# Patient Record
Sex: Male | Born: 1965 | Race: White | Hispanic: No | Marital: Married | Smoking: Never smoker
Health system: Southern US, Community
[De-identification: ages and names within clinical notes are randomized; demographics above are authoritative.]

## PROBLEM LIST (undated history)

## (undated) DIAGNOSIS — J45909 Unspecified asthma, uncomplicated: Secondary | ICD-10-CM

## (undated) DIAGNOSIS — E119 Type 2 diabetes mellitus without complications: Secondary | ICD-10-CM

## (undated) DIAGNOSIS — I429 Cardiomyopathy, unspecified: Secondary | ICD-10-CM

## (undated) DIAGNOSIS — I509 Heart failure, unspecified: Secondary | ICD-10-CM

## (undated) DIAGNOSIS — I10 Essential (primary) hypertension: Secondary | ICD-10-CM

## (undated) HISTORY — PX: CARDIAC CATHETERIZATION: SHX172

---

## 2012-12-20 DIAGNOSIS — N12 Tubulo-interstitial nephritis, not specified as acute or chronic: Secondary | ICD-10-CM | POA: Insufficient documentation

## 2013-07-23 DIAGNOSIS — I429 Cardiomyopathy, unspecified: Secondary | ICD-10-CM

## 2013-07-23 HISTORY — DX: Cardiomyopathy, unspecified: I42.9

## 2014-06-07 ENCOUNTER — Inpatient Hospital Stay: Payer: Self-pay | Admitting: Internal Medicine

## 2014-06-07 LAB — COMPREHENSIVE METABOLIC PANEL
AST: 14 U/L — AB (ref 15–37)
Albumin: 3.2 g/dL — ABNORMAL LOW (ref 3.4–5.0)
Alkaline Phosphatase: 87 U/L
Anion Gap: 7 (ref 7–16)
BILIRUBIN TOTAL: 0.5 mg/dL (ref 0.2–1.0)
BUN: 15 mg/dL (ref 7–18)
CHLORIDE: 104 mmol/L (ref 98–107)
CO2: 24 mmol/L (ref 21–32)
Calcium, Total: 8.1 mg/dL — ABNORMAL LOW (ref 8.5–10.1)
Creatinine: 0.89 mg/dL (ref 0.60–1.30)
EGFR (African American): >60
EGFR (Non-African Amer.): >60
GLUCOSE: 293 mg/dL — AB (ref 65–99)
Osmolality: 282 (ref 275–301)
POTASSIUM: 3.7 mmol/L (ref 3.5–5.1)
SGPT (ALT): 26 U/L
SODIUM: 135 mmol/L — AB (ref 136–145)
TOTAL PROTEIN: 7 g/dL (ref 6.4–8.2)

## 2014-06-07 LAB — CBC
HCT: 44 % (ref 40.0–52.0)
HGB: 14.4 g/dL (ref 13.0–18.0)
MCH: 28.4 pg (ref 26.0–34.0)
MCHC: 32.8 g/dL (ref 32.0–36.0)
MCV: 87 fL (ref 80–100)
Platelet: 254 10*3/uL (ref 150–440)
RBC: 5.07 10*6/uL (ref 4.40–5.90)
RDW: 14.4 % (ref 11.5–14.5)
WBC: 11.7 10*3/uL — ABNORMAL HIGH (ref 3.8–10.6)

## 2014-06-07 LAB — CK-MB
CK-MB: 1.5 ng/mL (ref 0.5–3.6)
CK-MB: 0.8 ng/mL (ref 0.5–3.6)

## 2014-06-07 LAB — PROTIME-INR
INR: 1
PROTHROMBIN TIME: 13 s (ref 11.5–14.7)

## 2014-06-07 LAB — TROPONIN I
Troponin-I: <0.02 ng/mL
Troponin-I: <0.02 ng/mL

## 2014-06-07 LAB — CK TOTAL AND CKMB (NOT AT ARMC)
CK, Total: 75 U/L
CK-MB: 0.9 ng/mL (ref 0.5–3.6)

## 2014-06-07 LAB — PRO B NATRIURETIC PEPTIDE: B-Type Natriuretic Peptide: 712 pg/mL — ABNORMAL HIGH (ref 0–125)

## 2014-06-07 LAB — D-DIMER(ARMC): D-Dimer: 701 ng/ml

## 2014-06-08 LAB — CBC WITH DIFFERENTIAL/PLATELET
BASOS ABS: 0 10*3/uL (ref 0.0–0.1)
BASOS PCT: 0.1 %
Eosinophil #: 0 10*3/uL (ref 0.0–0.7)
Eosinophil %: 0 %
HCT: 42.5 % (ref 40.0–52.0)
HGB: 13.8 g/dL (ref 13.0–18.0)
LYMPHS ABS: 1.4 10*3/uL (ref 1.0–3.6)
Lymphocyte %: 12.8 %
MCH: 28.5 pg (ref 26.0–34.0)
MCHC: 32.6 g/dL (ref 32.0–36.0)
MCV: 88 fL (ref 80–100)
MONOS PCT: 4.3 %
Monocyte #: 0.5 x10 3/mm (ref 0.2–1.0)
Neutrophil #: 9.3 10*3/uL — ABNORMAL HIGH (ref 1.4–6.5)
Neutrophil %: 82.8 %
PLATELETS: 258 10*3/uL (ref 150–440)
RBC: 4.85 10*6/uL (ref 4.40–5.90)
RDW: 14 % (ref 11.5–14.5)
WBC: 11.2 10*3/uL — AB (ref 3.8–10.6)

## 2014-06-08 LAB — LIPID PANEL
CHOLESTEROL: 154 mg/dL (ref 0–200)
HDL Cholesterol: 36 mg/dL — ABNORMAL LOW (ref 40–60)
LDL CHOLESTEROL, CALC: 99 mg/dL (ref 0–100)
Triglycerides: 94 mg/dL (ref 0–200)
VLDL Cholesterol, Calc: 19 mg/dL (ref 5–40)

## 2014-06-08 LAB — BASIC METABOLIC PANEL
Anion Gap: 6 — ABNORMAL LOW (ref 7–16)
BUN: 14 mg/dL (ref 7–18)
CALCIUM: 8.1 mg/dL — AB (ref 8.5–10.1)
CHLORIDE: 106 mmol/L (ref 98–107)
CO2: 26 mmol/L (ref 21–32)
CREATININE: 0.79 mg/dL (ref 0.60–1.30)
EGFR (African American): >60
EGFR (Non-African Amer.): >60
GLUCOSE: 255 mg/dL — AB (ref 65–99)
Osmolality: 285 (ref 275–301)
Potassium: 4.1 mmol/L (ref 3.5–5.1)
Sodium: 138 mmol/L (ref 136–145)

## 2014-06-08 LAB — HEMOGLOBIN A1C: Hemoglobin A1C: 9.7 % — ABNORMAL HIGH (ref 4.2–6.3)

## 2014-06-09 LAB — BASIC METABOLIC PANEL
Anion Gap: 6 — ABNORMAL LOW (ref 7–16)
BUN: 22 mg/dL — ABNORMAL HIGH (ref 7–18)
CHLORIDE: 106 mmol/L (ref 98–107)
CO2: 27 mmol/L (ref 21–32)
Calcium, Total: 8.3 mg/dL — ABNORMAL LOW (ref 8.5–10.1)
Creatinine: 1.01 mg/dL (ref 0.60–1.30)
EGFR (African American): >60
EGFR (Non-African Amer.): >60
GLUCOSE: 212 mg/dL — AB (ref 65–99)
Osmolality: 287 (ref 275–301)
Potassium: 4 mmol/L (ref 3.5–5.1)
SODIUM: 139 mmol/L (ref 136–145)

## 2014-06-09 LAB — CBC
HCT: 41.5 % (ref 40.0–52.0)
HGB: 13.4 g/dL (ref 13.0–18.0)
MCH: 28.3 pg (ref 26.0–34.0)
MCHC: 32.4 g/dL (ref 32.0–36.0)
MCV: 87 fL (ref 80–100)
Platelet: 283 10*3/uL (ref 150–440)
RBC: 4.75 10*6/uL (ref 4.40–5.90)
RDW: 14.2 % (ref 11.5–14.5)
WBC: 15.7 10*3/uL — AB (ref 3.8–10.6)

## 2014-06-12 LAB — EXPECTORATED SPUTUM ASSESSMENT W GRAM STAIN, RFLX TO RESP C

## 2014-06-14 DIAGNOSIS — I10 Essential (primary) hypertension: Secondary | ICD-10-CM | POA: Insufficient documentation

## 2014-06-14 DIAGNOSIS — E785 Hyperlipidemia, unspecified: Secondary | ICD-10-CM | POA: Insufficient documentation

## 2014-06-14 DIAGNOSIS — I423 Endomyocardial (eosinophilic) disease: Secondary | ICD-10-CM | POA: Insufficient documentation

## 2014-06-14 DIAGNOSIS — I420 Dilated cardiomyopathy: Secondary | ICD-10-CM | POA: Insufficient documentation

## 2014-06-23 DIAGNOSIS — E119 Type 2 diabetes mellitus without complications: Secondary | ICD-10-CM | POA: Insufficient documentation

## 2014-08-11 DIAGNOSIS — J45909 Unspecified asthma, uncomplicated: Secondary | ICD-10-CM | POA: Insufficient documentation

## 2014-10-25 DIAGNOSIS — F419 Anxiety disorder, unspecified: Secondary | ICD-10-CM | POA: Insufficient documentation

## 2014-10-25 DIAGNOSIS — F329 Major depressive disorder, single episode, unspecified: Secondary | ICD-10-CM | POA: Insufficient documentation

## 2014-10-25 DIAGNOSIS — F32A Depression, unspecified: Secondary | ICD-10-CM | POA: Insufficient documentation

## 2014-11-09 ENCOUNTER — Other Ambulatory Visit (HOSPITAL_COMMUNITY): Payer: Self-pay | Admitting: Pediatrics

## 2014-11-09 DIAGNOSIS — I255 Ischemic cardiomyopathy: Secondary | ICD-10-CM

## 2014-11-13 NOTE — Consult Note (Signed)
Present Illness 49 year old male with no prior cardiac history who is admitted after presenting to the emergency room with a syncopal episode.  Patient has a history of reactive airway disease as well as diabetes mellitus.  He was sitting in a meeting where he felt a pounding in his had followed by a syncopal episode where the cause trauma to his left thigh.  Immediately upon the syncopal episode, he was placed on an EMS monitor.  This revealed sinus tachycardia.  There were no pauses or ventricular arrhythmias.  He was brought to emergency room where he was noted to have sinus rhythm.  He will about from myocardia infarction.  Echocardiogram revealed severely reduced left ventricular function with an ejection fraction of approximately 20-25%.  This was global hypokinesis.  Patient denies any prior history of he denies any history of chest pain.  He denies any fever chills.  He denies heavy ethanol intake.  Electrocardiogram reveals sinus rhythm.  His carotid Dopplers revealed no significant disease.  Chest x-ray revealed no pulmonary edema.  He denies orthopnea or PND.   Physical Exam:  GEN well developed, well nourished, no acute distress   HEENT PERRL, Ecchymosis over his left eye   NECK supple   RESP clear BS   CARD Regular rate and rhythm   ABD denies tenderness  no liver/spleen enlargement  normal BS   LYMPH negative neck, negative axillae   EXTR negative cyanosis/clubbing, negative edema   SKIN normal to palpation   NEURO cranial nerves intact, motor/sensory function intact   PSYCH A+O to time, place, person   Review of Systems:  Subjective/Chief Complaint general:  Caucasian male in no acute distress   General: No Complaints   Skin: No Complaints   ENT: No Complaints   Eyes: No Complaints   Neck: No Complaints   Respiratory: No Complaints   Cardiovascular: No Complaints   Gastrointestinal: No Complaints   Genitourinary: No Complaints   Vascular: No Complaints    Musculoskeletal: No Complaints   Neurologic: No Complaints   Hematologic: No Complaints   Endocrine: No Complaints   Psychiatric: No Complaints   Review of Systems: All other systems were reviewed and found to be negative   Medications/Allergies Reviewed Medications/Allergies reviewed   Family & Social History:  Family and Social History:  Family History Non-Contributory   Social History negative tobacco, positive ETOH   Place of Living Home   EKG:  EKG NSR   Interpretation sinus tachycardia    Penicillin: Other  Eggs: Unknown   Impression 49 year old male who presented with a syncopal episode.  He etiology of this is unclear.  Certainly ventricular or supraventricular arrhythmia may be playing a role.  Given his cardiomyopathy, ventricular arrhythmias as to be of some concern.  He etiology of his cardiomyopathy is unclear.  This is either ischemic verses etiopathic versus by role.  He has diabetes therefore small-vessel disease may be playing a role.  He has been placed on carvedilol as well as afterload reduction with an ACE-inhibitor.  He is not clinically in heart failure at present.  Will need to evaluate possible ischemic etiology of his cardiomyopathy with left cardiac catheterization.  Risk and benefits of this procedure were explained to the patient and he agrees to proceed.   Plan 1. Continue with carvedilol and ACE-inhibitor 2. Low-fat low-cholesterol diet 3. Daily weights 4. Proceed left cardiac catheterization to evaluate coronary anatomy 5. Consideration for  ICD versus empiric medical therapy based on the results of  the cardiac catheterization. 6. Further recommendations after catheterization is completed   Electronic Signatures: Dalia Heading (MD)  (Signed 17-Nov-15 19:55)  Authored: General Aspect/Present Illness, History and Physical Exam, Review of System, Family & Social History, EKG , Allergies, Impression/Plan   Last Updated: 17-Nov-15 19:55  by Dalia Heading (MD)

## 2014-11-13 NOTE — Consult Note (Signed)
PATIENT NAME:  Daniel [name], Daniel [name] MR#:  [id number] DATE OF BIRTH:  05-Jan-1966  DATE OF CONSULTATION:  06/08/2014  REFERRING PHYSICIAN:  Katharina Caper, MD  CONSULTING PHYSICIAN:  A. Wendall Mola, MD  CHIEF COMPLAINT: Uncontrolled diabetes.   HISTORY OF PRESENT ILLNESS: This is a 49 year old male with a history of diabetes, hypertension and asthma who was admitted yesterday after having had a syncopal event. He has undergone extensive laboratory testing and imaging and found to have an asthma exacerbation as well as new diagnosis of congestive heart failure. Diabetes is uncontrolled with a hemoglobin A1c of 9.7%. He has been started on Solu-Medrol for his asthma exacerbation. Today, blood sugars are consistently elevated in the 230-340 range. Currently receiving glipizide 5 mg twice daily for diabetes in addition to a NovoLog insulin sliding scale.   The patient has a history of diabetes since 2010. No prior hospitalizations for diabetes. Had been prescribed metformin prior to this hospitalization, which he had been compliant with. However, he states a few years ago he lost 100 pounds while on a very low carb diet and he is again hopeful he can improve his glycemic control with diet alone. However, he did subsequently regain that weight once he liberalized his diet. Also states that over the last year he has been put on steroids several times for asthma as well as poison ivy rash. Recalls his prior hemoglobin A1c about 7 months ago was 5.6%. He rarely checks blood sugars. Denies polyuria or increased thirst or blurred vision.   PAST MEDICAL HISTORY:  1.  Asthma.  2.  Hypertension.  3.  Morbid obesity.  4.  Congestive heart failure.  5.  Diabetes mellitus.   SOCIAL HISTORY: The patient is employed by [employer]. He is married with children. No tobacco use.   FAMILY HISTORY: Mother has diabetes. Father had heart disease with acute MI in his 36s.   ALLERGIES: INCLUDE  PENICILLIN AND EGG ALLERGIES.   CURRENT MEDICATIONS:  1.  Glipizide XL 5 mg twice daily.  2.  NovoLog insulin sliding scale.  3.  Coreg 3.125 mg b.i.d.  4.  Singulair 10 mg daily.  5.  Heparin 5000 units subcutaneous q. 8 hours.  6.  Albuterol nebulizers q. 3-4 hours.  7.  Advair 250/50 one puff b.i.d.  8.  Rocephin 1 gram daily.   REVIEW OF SYSTEMS: GENERAL: He has had fevers. He has had weakness.  HEENT: He has had blurred vision. Denies headache. NECK: Denies neck pain or dysphagia.  CARDIAC: Denies chest pain or palpitation.  PULMONARY: Reports wheeze and shortness of breath.  ABDOMEN: Appetite has been fair. Denies nausea.  EXTREMITIES: Denies lower extremity swelling. Denies focal arthralgia.  GENITOURINARY: Denies polyuria. Denies dysuria.  ENDOCRINE: Denies heat or cold intolerance.  HEMATOLOGIC: Denies easy bruisability or recent bleeding.  SKIN: Denies rash or recent skin changes.   PHYSICAL EXAMINATION:  VITAL SIGNS: Temperature 97.7, pulse 121, respirations 18, BP 117/74.  GENERAL: Morbidly obese white male in no acute distress.  HEENT: EOMI. Oropharynx is clear. Mucous membranes moist.  NECK: Supple. No thyromegaly. No thyroid bruit.  CARDIAC: Tachycardia without murmur. No carotid bruit.  PULMONARY: Mild wheeze, good air movement throughout.  ABDOMEN: Diffusely soft, nontender, nondistended.  SKIN: No rash or other skin changes.  EXTREMITIES: No peripheral edema is present.  NEUROLOGIC: No focal deficits.  PSYCHIATRIC: Calm, cooperative, alert and oriented.   LABORATORY: Glucose 255, BUN 14, creatinine 0.79, sodium 138, potassium 4.1, triglycerides 94,  total cholesterol 154, calcium 8.1. Hemoglobin A1c 9.7%, hematocrit 42.5. WBC 11.2, platelets 258,000.   ASSESSMENT: A 49 year old male with uncontrolled type 2 diabetes in setting of high-dose steroids.   RECOMMENDATIONS:  1.  Continue glipizide as you are.  2.  Hold metformin in the setting of congestive  heart failure.  3.  We will add a scheduled mealtime NovoLog of 6 units t.i.d. a.c., to be increased as needed to better control blood sugars.  4.  Continue NovoLog insulin sliding scale as you are.  5.  We will hold off on starting basal insulin for now.  6.  Encouraged the patient to start self-monitoring of blood sugars. He will need a glucometer prescription at discharge.   Thank you for the kind request for consultation. I will follow along with you.     ____________________________ A. Wendall Mola, MD ams:TT D: 06/08/2014 21:53:37 ET T: 06/08/2014 22:10:43 ET JOB#: [id number]  cc: A. Wendall Mola, MD, <Dictator> Macy Mis MD ELECTRONICALLY SIGNED 06/23/2014 17:35

## 2014-11-13 NOTE — Discharge Summary (Signed)
Dates of Admission and Diagnosis:  Date of Admission 07-Jun-2014   Date of Discharge 10-Jun-2014   Admitting Diagnosis syncopal episode   Final Diagnosis Severe cardiomyopathy- acute congestive heart failure No coronary artery disease. Likely Arrhythmia indused syncopal episode. Hypertension,  Diabetes mellitus Asthma    Chief Complaint/History of Present Illness 49 year old man with past medical history of diabetes, hypertension, asthma, presents after a syncopal event today. 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 He has been struggling with ear and sinus infections for the past 1 to 2 weeks. During the meeting he started to experience pulsing head pain "like someone was hitting my head with a mallet." He does not remember anything after that. The next thing he remembers is lying on the ground looking up at his colleagues trying to revive him. Per the people in the room, he stood up, took a few steps, and then fell forward onto his face and was unconscious. He denies any palpitations, chest pain, shortness of breath. He had no focal weakness or confusion upon waking. At the time of Emergency Room evaluation, he is asymptomatic, states that he is feeling fine, and would like to go home.  On examination, however, he has significant wheezing, and his chest x-ray and CT are suggestive of infection. He is admitted for further evaluation and treatment.   Allergies:  ceftriaxone: Hives  Penicillin: Other  Eggs: Unknown  Pertinent Past History:  Pertinent Past History 1.  Diabetes mellitus type 2.  2.  Hypertension.  3.  Asthma. 4.  Possible obstructive sleep apnea, not on CPAP.   Hospital Course:  Hospital Course 1. syncope in pt with severe cardiomyopathy, could be related to arrhythmias, getting cardiology consult, no obious ischaemic event, started coreg, ACEI.    As per Dr. Lady Gary- he will arrange  holter monitor as out pt- pt has to see him early next week. 2. cardiomyopathy, r/o CAD as per cath report, started coreg, lisinopril- As per cardio may need AICD- if not much improvement in next few weeks. 3. hyponatremia, likley dehydartion, supplemented IV, resolved  4. acute bronchitis vs pneumonia, now on zithromax, got sputum cx 5. asthma excerbation, steroids, duonebs, advair- minimal wheezing. 6 diabetes, type 2, hg a1c 9.7, poor control, advance diabetic meds,  appreciated endocrine consult discharge home today.   Condition on Discharge Stable   Code Status:  Code Status Full Code   DISCHARGE INSTRUCTIONS HOME MEDS:  Medication Reconciliation: Patient's Home Medications at Discharge:     Medication Instructions  singulair 10 mg oral tablet  1 tab(s) orally once a day (in the evening)   glipizide-metformin  10 milligram(s) orally once a day- before breakfast.   aspirin 81 mg oral delayed release tablet  1 tab(s) orally once a day x 30 days   lisinopril 5 mg oral tablet  1 tab(s) orally once a day   novolog flexpen 100 units/ml subcutaneous solution  4 unit(s) subcutaneous 2 times a day- before mealsunits for Sugar 70-150, 10 for 151- 200,for 201-250; 14 for 251-300; 16 units for >300.   carvedilol 3.125 mg oral tablet  1 tab(s) orally 2 times a day   advair diskus 250 mcg-50 mcg inhalation powder  1 puff(s) inhaled 2 times a day   azithromycin 3 day dose pack 500 mg oral tablet  1 tab(s) orally once a day x 3 days   glipizide-metformin  5 milligram(s) orally once  a day- before supper    STOP TAKING THE FOLLOWING MEDICATION(S):    metformin:  orally   Physician's Instructions:  Diet Low Sodium  Low Fat, Low Cholesterol  Carbohydrate Controlled (ADA) Diet   Activity Limitations As tolerated   Return to Work Not Applicable   Time frame for Follow Up Appointment 1-2 days  Dr. Lady Gary   Other Comments Follow with Dr. Lady Gary in 3-4 days.     Carlena Sax M(Consultant):  [facility] - Internal Medicine, [address], [place], [place] [address], [place] [phone]   Harold Hedge A(Consultant): [facility], [address] [place], [place] [address], [place] [phone]  Electronic Signatures: Altamese Dilling (MD)  (Signed [phone] 14:01)  Authored: ADMISSION DATE AND DIAGNOSIS, CHIEF COMPLAINT/HPI, Allergies, PERTINENT PAST HISTORY, HOSPITAL COURSE, DISCHARGE INSTRUCTIONS HOME MEDS, PATIENT INSTRUCTIONS, Follow Up Physician   Last Updated: 23-Nov-15 14:01 by Altamese Dilling (MD)

## 2014-11-13 NOTE — H&P (Signed)
PATIENT NAME:  Daniel [name], Daniel [name] MR#:  [id number] DATE OF BIRTH:  06-16-1966  DATE OF ADMISSION:  06/07/2014  REFERRING EMERGENCY ROOM PHYSICIAN: Dr Fanny Bien PRIMARY CARE PHYSICIAN: None.   CHIEF COMPLAINT: Syncope.   HISTORY OF PRESENT ILLNESS: This very pleasant 49 year old man with past medical history of diabetes, hypertension, asthma, presents after a syncopal event today. 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 He has been struggling with ear and sinus infections for the past 1 to 2 weeks. During the meeting he started to experience pulsing head pain "like someone was hitting my head with a mallet." He does not remember anything after that. The next thing he remembers is lying on the ground looking up at his colleagues trying to revive him. Per the people in the room, he stood up, took a few steps, and then fell forward onto his face and was unconscious. He denies any palpitations, chest pain, shortness of breath. He had no focal weakness or confusion upon waking. At the time of Emergency Room evaluation, he is asymptomatic, states that he is feeling fine, and would like to go home.  On examination, however, he has significant wheezing, and his chest x-ray and CT are suggestive of infection. He is admitted for further evaluation and treatment.   PAST MEDICAL HISTORY:  1.  Diabetes mellitus type 2.  2.  Hypertension.  3.  Asthma. 4.  Possible obstructive sleep apnea, not on CPAP.   SOCIAL HISTORY: The patient lives with his wife. 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 He does not smoke cigarettes, drink alcohol, or use illicit substances.   FAMILY MEDICAL HISTORY: His father had an MI in his 6s. His mother has diabetes and COPD. He has several family members with lung cancer, brain cancer, or breast cancer.   ALLERGIES: THE PATIENT IS ALLERGIC TO PENICILLIN AND EGGS.   HOME MEDICATIONS:  1.  Singulair 10 mg 1  tablet once a day.  2.  Metformin, dose unknown.  3.  Lisinopril 2.5 mg once a day at bedtime.  4.  Advair Diskus 250 mcg/50 mcg 1 puff twice a day.   REVIEW OF SYSTEMS:  GENERAL: Positive for fevers, chills, sweats, weakness. Negative for weight change.  HEENT: Positive for recent vision blurring. No pain in the eyes. Pounding in the ears, facial pressure, sore throat. No difficulty swallowing.  RESPIRATORY: Positive for cough, wheezing. Negative for hemoptysis. Sputum has been clear.  CARDIOVASCULAR: Negative for chest pain, palpitations. Positive as noted above for syncope. No edema.  GASTROINTESTINAL: Negative for nausea, vomiting, diarrhea, abdominal pain.  MUSCULOSKELETAL: Positive at this time for a hematoma over the left eye. No joint effusions, swollen tender joints, myalgias, or muscle weakness.  NEUROLOGIC: Positive for syncopal event today. Prior to that, no history of syncope, seizure, CVA, focal numbness or weakness or confusion.  PSYCHIATRIC: He does endorse significant stress. He has not slept in greater than 24 hours but states that this is routine for his job.   PHYSICAL EXAMINATION:  VITAL SIGNS: Temperature 97.5, pulse 118, respirations 20, blood pressure 122/74, oxygenation 94% on room air.  GENERAL: No acute distress.  HEENT: Pupils are equal, round, and reactive to light. Extraocular motion is intact. Conjunctivae are clear. Oral mucous membranes are pink and moist. Good dentition. Posterior oropharynx is clear without exudate or edema. There is slight erythema in the posterior oropharynx.  NECK: Trachea is midline.  Shotty cervical lymphadenopathy. Thyroid is nontender.  RESPIRATORY: Diffuse loud wheezes and rhonchi, fair air movement, breathing rapidly. Lots of coughing during examination. Expiratory wheezing.  CARDIOVASCULAR: Tachycardic, regular. No murmurs, rubs, or gallops. No peripheral edema. Peripheral pulses are 2+.  ABDOMEN: Obese, soft, nontender. Bowel sounds  are normal. No guarding or rebound.  MUSCULOSKELETAL: No joint effusions. Strength is 5/5 throughout.  SKIN: He has a large hematoma over the left eye. No open wound, no rashes.  NEUROLOGIC: Cranial nerves II through XII grossly intact. Strength and sensation are intact.  PSYCHIATRIC: The patient is alert and oriented, has good insight into his clinical condition. No signs of uncontrolled depression or anxiety.   LABORATORY DATA: Sodium 135, potassium 3.7, chloride 104, bicarbonate 24, BUN 15, creatinine 0.89, blood sugar 293. LFTs normal. Cardiac enzymes negative x 1. White blood cells 11.7, hemoglobin 14.4, platelets 254,000, MCV 87.  IMAGING: 1.  CT angiography for PE due to high D-dimer is negative for signs of PE. Does show patchy areas of ground glass disease in both lungs. Differential diagnosis includes edema versus atypical infection. May have areas of air trapping. There are a few small nodular densities in the lungs, largest measuring 4 mm. Also had mid mediastinal and hilar lymphadenopathy. 2.  CT, maxillofacial area without contrast, shows left scalp hematoma without underlying fracture, normal noncontrast CT of the brain, no facial fracture, acute paranasal sinusitis. 3.  Chest x-ray shows no active cardiopulmonary disease.   ASSESSMENT AND PLAN:  1.  Syncopal episode: Will admit to the floor on telemetry. Will cycle cardiac enzymes. Will order a 2D echocardiogram, also, in light of the CT findings suggesting possible pulmonary edema. Will go ahead and treat his sinusitis and provide intravenous hydration. 2.  Acute asthma exacerbation: Treat with nebulizers, steroids, and  azithromycin. Continue to monitor respiratory status closely.  3.  CT findings suggestive of possible pulmonary edema versus atypical infection: Treating atypical infection with azithromycin. We will get a 2-D echocardiogram to evaluate possibility of pulmonary edema. The pulmonary nodules have been discussed with  him and he knows that he is to have a repeat CT in 6 months to follow up. He does note that he has a long history of untreated asthma, which may be the cause of the air trapping, which may be the cause of some of these findings; could have some scarring due to lack of asthma treatment in the past.  4.  Diabetes mellitus: Check a hemoglobin A1c and use sliding scale insulin while inpatient. 5.  Sinusitis: Will be treated with azithromycin.  6.  Hypertension: Continue to monitor. Will need to double check his dose of lisinopril; this is more likely 20 mg rather than the 2 mg noted in the medication administration record.  7.  Deep vein thrombosis prophylaxis with heparin, no gastrointestinal prophylaxis.   TIME SPENT ON ADMISSION: 45 minutes.    ____________________________ Ena Dawley. Clent Ridges, MD cpw:ST D: 06/07/2014 22:20:16 ET T: 06/07/2014 23:18:53 ET JOB#: [id number]  cc: Santina Evans P. Clent Ridges, MD, <Dictator> Gale Journey MD ELECTRONICALLY SIGNED 06/10/2014 13:21

## 2014-11-16 DIAGNOSIS — E559 Vitamin D deficiency, unspecified: Secondary | ICD-10-CM | POA: Insufficient documentation

## 2014-11-18 ENCOUNTER — Ambulatory Visit (HOSPITAL_COMMUNITY)
Admission: RE | Admit: 2014-11-18 | Discharge: 2014-11-18 | Disposition: A | Discharge status: Home / Self Care | Payer: Disability Insurance | Source: Ambulatory Visit | Attending: Cardiology | Admitting: Cardiology

## 2014-11-18 DIAGNOSIS — G4730 Sleep apnea, unspecified: Secondary | ICD-10-CM | POA: Insufficient documentation

## 2014-11-18 DIAGNOSIS — E119 Type 2 diabetes mellitus without complications: Secondary | ICD-10-CM | POA: Insufficient documentation

## 2014-11-18 DIAGNOSIS — I10 Essential (primary) hypertension: Secondary | ICD-10-CM | POA: Insufficient documentation

## 2014-11-18 DIAGNOSIS — I255 Ischemic cardiomyopathy: Secondary | ICD-10-CM | POA: Diagnosis present

## 2014-11-18 MED ORDER — PERFLUTREN LIPID MICROSPHERE
1.0000 mL | INTRAVENOUS | Status: AC | PRN
  Administered 2014-11-18: 3 mL via INTRAVENOUS

## 2014-11-18 NOTE — Progress Notes (Signed)
2D Echo Performed 11/18/2014    Clearence Ped, RCS  Definity used BP before 130/89 and 108/70 after

## 2015-02-18 ENCOUNTER — Other Ambulatory Visit: Payer: Self-pay | Admitting: Cardiology

## 2015-02-18 DIAGNOSIS — I428 Other cardiomyopathies: Secondary | ICD-10-CM

## 2015-02-23 ENCOUNTER — Ambulatory Visit: Admission: RE | Admit: 2015-02-23 | Payer: PRIVATE HEALTH INSURANCE | Source: Ambulatory Visit | Admitting: Cardiology

## 2015-02-24 ENCOUNTER — Ambulatory Visit
Admission: RE | Admit: 2015-02-24 | Discharge: 2015-02-24 | Disposition: A | Discharge status: Home / Self Care | Payer: PRIVATE HEALTH INSURANCE | Source: Ambulatory Visit | Attending: Cardiology | Admitting: Cardiology

## 2015-02-24 DIAGNOSIS — I429 Cardiomyopathy, unspecified: Secondary | ICD-10-CM | POA: Diagnosis present

## 2015-02-24 DIAGNOSIS — I428 Other cardiomyopathies: Secondary | ICD-10-CM

## 2015-02-24 HISTORY — DX: Type 2 diabetes mellitus without complications: E11.9

## 2015-02-24 MED ORDER — TECHNETIUM TC 99M-LABELED RED BLOOD CELLS IV KIT
22.4700 | PACK | Freq: Once | INTRAVENOUS | Status: AC | PRN
  Administered 2015-02-24: 22.47 via INTRAVENOUS

## 2015-05-27 DIAGNOSIS — Z87898 Personal history of other specified conditions: Secondary | ICD-10-CM | POA: Insufficient documentation

## 2015-06-21 ENCOUNTER — Encounter
Admission: RE | Admit: 2015-06-21 | Discharge: 2015-06-21 | Disposition: A | Discharge status: Home / Self Care | Payer: PRIVATE HEALTH INSURANCE | Source: Ambulatory Visit | Attending: Cardiology | Admitting: Cardiology

## 2015-06-21 DIAGNOSIS — Z01812 Encounter for preprocedural laboratory examination: Secondary | ICD-10-CM | POA: Insufficient documentation

## 2015-06-21 HISTORY — DX: Essential (primary) hypertension: I10

## 2015-06-21 HISTORY — DX: Unspecified asthma, uncomplicated: J45.909

## 2015-06-21 HISTORY — DX: Cardiomyopathy, unspecified: I42.9

## 2015-06-21 HISTORY — DX: Heart failure, unspecified: I50.9

## 2015-06-21 LAB — URINALYSIS COMPLETE WITH MICROSCOPIC (ARMC ONLY)
BILIRUBIN URINE: NEGATIVE
Bacteria, UA: NONE SEEN
GLUCOSE, UA: 150 mg/dL — AB
HGB URINE DIPSTICK: NEGATIVE
Ketones, ur: NEGATIVE mg/dL
LEUKOCYTES UA: NEGATIVE
NITRITE: NEGATIVE
Protein, ur: NEGATIVE mg/dL
SPECIFIC GRAVITY, URINE: 1.01 (ref 1.005–1.030)
Squamous Epithelial / LPF: NONE SEEN
pH: 5 (ref 5.0–8.0)

## 2015-06-21 LAB — PROTIME-INR
INR: 0.85
Prothrombin Time: 11.9 seconds (ref 11.4–15.0)

## 2015-06-21 LAB — BASIC METABOLIC PANEL
Anion gap: 9 (ref 5–15)
BUN: 16 mg/dL (ref 6–20)
CALCIUM: 9.5 mg/dL (ref 8.9–10.3)
CO2: 27 mmol/L (ref 22–32)
Chloride: 102 mmol/L (ref 101–111)
Creatinine, Ser: 0.81 mg/dL (ref 0.61–1.24)
GFR calc Af Amer: >60 mL/min (ref >60)
GLUCOSE: 227 mg/dL — AB (ref 65–99)
POTASSIUM: 4.1 mmol/L (ref 3.5–5.1)
Sodium: 138 mmol/L (ref 135–145)

## 2015-06-21 LAB — DIFFERENTIAL
BASOS ABS: 0.1 10*3/uL (ref 0–0.1)
BASOS PCT: 1 %
EOS ABS: 0.7 10*3/uL (ref 0–0.7)
Eosinophils Relative: 5 %
Lymphocytes Relative: 16 %
Lymphs Abs: 2.3 10*3/uL (ref 1.0–3.6)
MONOS PCT: 6 %
Monocytes Absolute: 0.9 10*3/uL (ref 0.2–1.0)
NEUTROS ABS: 10.4 10*3/uL — AB (ref 1.4–6.5)
NEUTROS PCT: 72 %

## 2015-06-21 LAB — CBC
HEMATOCRIT: 44.1 % (ref 40.0–52.0)
Hemoglobin: 14.4 g/dL (ref 13.0–18.0)
MCH: 27.8 pg (ref 26.0–34.0)
MCHC: 32.5 g/dL (ref 32.0–36.0)
MCV: 85.5 fL (ref 80.0–100.0)
PLATELETS: 296 10*3/uL (ref 150–440)
RBC: 5.16 MIL/uL (ref 4.40–5.90)
RDW: 15 % — AB (ref 11.5–14.5)
WBC: 14.3 10*3/uL — AB (ref 3.8–10.6)

## 2015-06-21 LAB — SURGICAL PCR SCREEN
MRSA, PCR: NEGATIVE
Staphylococcus aureus: NEGATIVE

## 2015-06-21 LAB — APTT: APTT: 26 s (ref 24–36)

## 2015-06-21 NOTE — Pre-Procedure Instructions (Signed)
Cathy from Dr. Maisie Fus' office called back to clarify pre-op orders: Clindamycin 900 mg IVPB, Vancomycin 1.5 grams IVPB and Gentamycin 80 mg in 500 ml irrigation fluid.

## 2015-06-21 NOTE — Pre-Procedure Instructions (Signed)
Pt agreed to call [facility] cardiology to find out which meds to take the day of surgery.

## 2015-06-21 NOTE — Pre-Procedure Instructions (Signed)
Unable to read dosage of Vancomycin pre-op order.   Called Dr. Maisie Fus' office, spoke with Roxanne one of the nurses, she will get this order verified and call this RN back.  Also requested last EKG that was done in their office be faxed to this office.

## 2015-06-21 NOTE — Patient Instructions (Addendum)
  Your procedure is scheduled on: Friday Dec. 16, 2016. Report to Same Day Surgery. To find out your arrival time please call [phone] between 1PM - 3PM on Thursday Dec. 15, 2016.  Remember: Instructions that are not followed completely may result in serious medical risk, up to and including death, or upon the discretion of your surgeon and anesthesiologist your surgery may need to be rescheduled.    _x___ 1. Do not eat food or drink liquids after midnight. No gum chewing or hard candies.     _x___ 2. No Alcohol for 24 hours before or after surgery.   ____ 3. Bring all medications with you on the day of surgery if instructed.    __x__ 4. Notify your doctor if there is any change in your medical condition     (cold, fever, infections).     Do not wear jewelry, make-up, hairpins, clips or nail polish.  Do not wear lotions, powders, or perfumes. You may wear deodorant.  Do not shave 48 hours prior to surgery. Men may shave face and neck.  Do not bring valuables to the hospital.    [facility] is not responsible for any belongings or valuables.               Contacts, dentures or bridgework may not be worn into surgery.  Leave your suitcase in the car. After surgery it may be brought to your room.  For patients admitted to the hospital, discharge time is determined by your treatment team.   Patients discharged the day of surgery will not be allowed to drive home.    Please read over the following fact sheets that you were given:   [facility] Preparing for Surgery  __x__ Take these medicines the morning of surgery with A SIP OF WATER:    1. As instructed by cardiologist.   ____ Luther Parody Enema (as directed)   __x__ Use CHG Soap as directed  __x__ Use inhalers on the day of surgery and bring to hospital.  __x__ Stop metformin 2 days prior to surgery    __x__ Take 1/2 of usual insulin dose the night before surgery and none on the morning of surgery.   __x__ Check with Dr.  Maisie Fus to see if pt needs to stop daily Aspirin.  _x_ Stop Anti-inflammatories on now.  Can take Tylenol for pain.   ____ Stop supplements until after surgery.    ____ Bring C-Pap to the hospital.

## 2015-07-08 ENCOUNTER — Encounter: Payer: Self-pay | Admitting: *Deleted

## 2015-07-08 ENCOUNTER — Observation Stay
Admission: RE | Admit: 2015-07-08 | Discharge: 2015-07-09 | Disposition: A | Discharge status: Home / Self Care | Payer: PRIVATE HEALTH INSURANCE | Source: Ambulatory Visit | Attending: Internal Medicine | Admitting: Internal Medicine

## 2015-07-08 ENCOUNTER — Ambulatory Visit: Payer: PRIVATE HEALTH INSURANCE | Admitting: Anesthesiology

## 2015-07-08 ENCOUNTER — Encounter
Admission: RE | Disposition: A | Discharge status: Home / Self Care | Payer: Self-pay | Source: Ambulatory Visit | Attending: Internal Medicine

## 2015-07-08 ENCOUNTER — Ambulatory Visit: Payer: PRIVATE HEALTH INSURANCE

## 2015-07-08 DIAGNOSIS — E7800 Pure hypercholesterolemia, unspecified: Secondary | ICD-10-CM | POA: Insufficient documentation

## 2015-07-08 DIAGNOSIS — Z880 Allergy status to penicillin: Secondary | ICD-10-CM | POA: Diagnosis not present

## 2015-07-08 DIAGNOSIS — I5022 Chronic systolic (congestive) heart failure: Secondary | ICD-10-CM | POA: Diagnosis not present

## 2015-07-08 DIAGNOSIS — R0602 Shortness of breath: Secondary | ICD-10-CM | POA: Diagnosis not present

## 2015-07-08 DIAGNOSIS — Z950 Presence of cardiac pacemaker: Secondary | ICD-10-CM

## 2015-07-08 DIAGNOSIS — Z91012 Allergy to eggs: Secondary | ICD-10-CM | POA: Diagnosis not present

## 2015-07-08 DIAGNOSIS — F419 Anxiety disorder, unspecified: Secondary | ICD-10-CM | POA: Diagnosis not present

## 2015-07-08 DIAGNOSIS — E119 Type 2 diabetes mellitus without complications: Secondary | ICD-10-CM | POA: Diagnosis not present

## 2015-07-08 DIAGNOSIS — Z79899 Other long term (current) drug therapy: Secondary | ICD-10-CM | POA: Insufficient documentation

## 2015-07-08 DIAGNOSIS — I423 Endomyocardial (eosinophilic) disease: Secondary | ICD-10-CM | POA: Diagnosis not present

## 2015-07-08 DIAGNOSIS — R55 Syncope and collapse: Secondary | ICD-10-CM | POA: Insufficient documentation

## 2015-07-08 DIAGNOSIS — F329 Major depressive disorder, single episode, unspecified: Secondary | ICD-10-CM | POA: Insufficient documentation

## 2015-07-08 DIAGNOSIS — R079 Chest pain, unspecified: Secondary | ICD-10-CM | POA: Insufficient documentation

## 2015-07-08 DIAGNOSIS — E785 Hyperlipidemia, unspecified: Secondary | ICD-10-CM | POA: Insufficient documentation

## 2015-07-08 DIAGNOSIS — Z888 Allergy status to other drugs, medicaments and biological substances status: Secondary | ICD-10-CM | POA: Diagnosis not present

## 2015-07-08 DIAGNOSIS — E559 Vitamin D deficiency, unspecified: Secondary | ICD-10-CM | POA: Insufficient documentation

## 2015-07-08 DIAGNOSIS — Z794 Long term (current) use of insulin: Secondary | ICD-10-CM | POA: Diagnosis not present

## 2015-07-08 DIAGNOSIS — I110 Hypertensive heart disease with heart failure: Secondary | ICD-10-CM | POA: Diagnosis present

## 2015-07-08 DIAGNOSIS — Z881 Allergy status to other antibiotic agents status: Secondary | ICD-10-CM | POA: Diagnosis not present

## 2015-07-08 DIAGNOSIS — I420 Dilated cardiomyopathy: Secondary | ICD-10-CM | POA: Insufficient documentation

## 2015-07-08 DIAGNOSIS — Z7982 Long term (current) use of aspirin: Secondary | ICD-10-CM | POA: Insufficient documentation

## 2015-07-08 HISTORY — PX: IMPLANTABLE CARDIOVERTER DEFIBRILLATOR (ICD) GENERATOR CHANGE: SHX5469

## 2015-07-08 LAB — GLUCOSE, CAPILLARY
GLUCOSE-CAPILLARY: 187 mg/dL — AB (ref 65–99)
Glucose-Capillary: 199 mg/dL — ABNORMAL HIGH (ref 65–99)
Glucose-Capillary: 146 mg/dL — ABNORMAL HIGH (ref 65–99)
Glucose-Capillary: 284 mg/dL — ABNORMAL HIGH (ref 65–99)

## 2015-07-08 SURGERY — ICD GENERATOR CHANGE
Anesthesia: Monitor Anesthesia Care | Site: Shoulder | Laterality: Left | Wound class: Clean

## 2015-07-08 MED ORDER — MIDAZOLAM HCL 2 MG/2ML IJ SOLN
INTRAMUSCULAR | Status: DC | PRN
  Administered 2015-07-08: 2 mg via INTRAVENOUS
  Administered 2015-07-08 (×2): 1 mg via INTRAVENOUS

## 2015-07-08 MED ORDER — SODIUM CHLORIDE 0.9 % IR SOLN
Freq: Once | Status: AC
  Administered 2015-07-08: 09:00:00

## 2015-07-08 MED ORDER — SPIRONOLACTONE 25 MG PO TABS
25.0000 mg | ORAL_TABLET | ORAL | Status: DC
  Administered 2015-07-09: 25 mg via ORAL
  Filled 2015-07-08: qty 1

## 2015-07-08 MED ORDER — ONDANSETRON HCL 4 MG/2ML IJ SOLN: 4.0000 mg | Freq: Four times a day (QID) | INTRAMUSCULAR | Status: DC | PRN

## 2015-07-08 MED ORDER — VANCOMYCIN HCL 10 G IV SOLR
1500.0000 mg | Freq: Once | INTRAVENOUS | Status: AC
  Administered 2015-07-08: 1500 mg via INTRAVENOUS
  Filled 2015-07-08: qty 1500

## 2015-07-08 MED ORDER — MORPHINE SULFATE (PF) 2 MG/ML IV SOLN
2.0000 mg | INTRAVENOUS | Status: DC | PRN
  Administered 2015-07-08 – 2015-07-09 (×3): 2 mg via INTRAVENOUS
  Filled 2015-07-08 (×3): qty 1

## 2015-07-08 MED ORDER — LIDOCAINE HCL (PF) 1 % IJ SOLN
INTRAMUSCULAR | Status: AC
  Filled 2015-07-08: qty 30

## 2015-07-08 MED ORDER — FENTANYL CITRATE (PF) 100 MCG/2ML IJ SOLN
25.0000 ug | INTRAMUSCULAR | Status: DC | PRN
  Administered 2015-07-08 (×2): 25 ug via INTRAVENOUS

## 2015-07-08 MED ORDER — LIDOCAINE 1 % OPTIME INJ - NO CHARGE
INTRAMUSCULAR | Status: DC | PRN
  Administered 2015-07-08: 30 mL

## 2015-07-08 MED ORDER — GLIPIZIDE ER 10 MG PO TB24
10.0000 mg | ORAL_TABLET | Freq: Two times a day (BID) | ORAL | Status: DC
  Administered 2015-07-08 – 2015-07-09 (×2): 10 mg via ORAL
  Filled 2015-07-08 (×2): qty 1

## 2015-07-08 MED ORDER — ONDANSETRON HCL 4 MG/2ML IJ SOLN: 4.0000 mg | Freq: Once | INTRAMUSCULAR | Status: DC | PRN

## 2015-07-08 MED ORDER — INSULIN ASPART 100 UNIT/ML ~~LOC~~ SOLN
0.0000 [IU] | Freq: Three times a day (TID) | SUBCUTANEOUS | Status: DC
  Administered 2015-07-08: 11 [IU] via SUBCUTANEOUS
  Filled 2015-07-08: qty 11

## 2015-07-08 MED ORDER — ASPIRIN EC 81 MG PO TBEC
81.0000 mg | DELAYED_RELEASE_TABLET | ORAL | Status: DC
  Administered 2015-07-09: 81 mg via ORAL
  Filled 2015-07-08: qty 1

## 2015-07-08 MED ORDER — DEXMEDETOMIDINE HCL IN NACL 200 MCG/50ML IV SOLN
INTRAVENOUS | Status: DC | PRN
  Administered 2015-07-08 (×3): 8 ug via INTRAVENOUS
  Administered 2015-07-08: 12 ug via INTRAVENOUS
  Administered 2015-07-08: 8 ug via INTRAVENOUS
  Administered 2015-07-08: 4 ug via INTRAVENOUS

## 2015-07-08 MED ORDER — MONTELUKAST SODIUM 10 MG PO TABS
10.0000 mg | ORAL_TABLET | ORAL | Status: DC
  Administered 2015-07-09: 10 mg via ORAL
  Filled 2015-07-08: qty 1

## 2015-07-08 MED ORDER — SODIUM CHLORIDE 0.9 % IV SOLN
INTRAVENOUS | Status: DC
  Administered 2015-07-08: 11:00:00 via INTRAVENOUS

## 2015-07-08 MED ORDER — INSULIN ASPART 100 UNIT/ML ~~LOC~~ SOLN: 0.0000 [IU] | Freq: Every day | SUBCUTANEOUS | Status: DC

## 2015-07-08 MED ORDER — METFORMIN HCL ER 500 MG PO TB24
1000.0000 mg | ORAL_TABLET | Freq: Two times a day (BID) | ORAL | Status: DC
  Administered 2015-07-09: 1000 mg via ORAL
  Filled 2015-07-08: qty 2

## 2015-07-08 MED ORDER — FENTANYL CITRATE (PF) 100 MCG/2ML IJ SOLN
INTRAMUSCULAR | Status: AC
  Administered 2015-07-08: 25 ug via INTRAVENOUS
  Filled 2015-07-08: qty 2

## 2015-07-08 MED ORDER — ONDANSETRON HCL 4 MG/2ML IJ SOLN
INTRAMUSCULAR | Status: DC | PRN
  Administered 2015-07-08: 4 mg via INTRAVENOUS

## 2015-07-08 MED ORDER — CARVEDILOL 12.5 MG PO TABS
6.2500 mg | ORAL_TABLET | Freq: Two times a day (BID) | ORAL | Status: DC
  Administered 2015-07-09: 6.25 mg via ORAL
  Filled 2015-07-08: qty 1

## 2015-07-08 MED ORDER — FAMOTIDINE 20 MG PO TABS
20.0000 mg | ORAL_TABLET | Freq: Once | ORAL | Status: AC
  Administered 2015-07-08: 20 mg via ORAL

## 2015-07-08 MED ORDER — FUROSEMIDE 20 MG PO TABS
20.0000 mg | ORAL_TABLET | Freq: Every day | ORAL | Status: DC
  Administered 2015-07-09: 20 mg via ORAL
  Filled 2015-07-08: qty 1

## 2015-07-08 MED ORDER — SODIUM CHLORIDE 0.9 % IR SOLN
Status: DC | PRN
  Administered 2015-07-08: 150 mL

## 2015-07-08 MED ORDER — INSULIN ASPART 100 UNIT/ML ~~LOC~~ SOLN: 0.0000 [IU] | Freq: Three times a day (TID) | SUBCUTANEOUS | Status: DC

## 2015-07-08 MED ORDER — LISINOPRIL 10 MG PO TABS
10.0000 mg | ORAL_TABLET | Freq: Every day | ORAL | Status: DC
  Administered 2015-07-09: 10 mg via ORAL
  Filled 2015-07-08: qty 1

## 2015-07-08 MED ORDER — SERTRALINE HCL 50 MG PO TABS
50.0000 mg | ORAL_TABLET | Freq: Every day | ORAL | Status: DC
  Administered 2015-07-08: 50 mg via ORAL
  Filled 2015-07-08 (×2): qty 1

## 2015-07-08 MED ORDER — INSULIN ASPART 100 UNIT/ML ~~LOC~~ SOLN: 6.0000 [IU] | Freq: Two times a day (BID) | SUBCUTANEOUS | Status: DC

## 2015-07-08 MED ORDER — CLINDAMYCIN PHOSPHATE 900 MG/50ML IV SOLN
900.0000 mg | Freq: Once | INTRAVENOUS | Status: AC
  Administered 2015-07-08: 900 mg via INTRAVENOUS

## 2015-07-08 MED ORDER — FAMOTIDINE 20 MG PO TABS
ORAL_TABLET | ORAL | Status: AC
  Administered 2015-07-08: 10:00:00
  Filled 2015-07-08: qty 1

## 2015-07-08 MED ORDER — ACETAMINOPHEN 325 MG PO TABS
325.0000 mg | ORAL_TABLET | ORAL | Status: DC | PRN
  Filled 2015-07-08: qty 2

## 2015-07-08 MED ORDER — OXYCODONE-ACETAMINOPHEN 5-325 MG PO TABS
1.0000 | ORAL_TABLET | Freq: Four times a day (QID) | ORAL | Status: DC | PRN
  Administered 2015-07-08 – 2015-07-09 (×3): 2 via ORAL
  Filled 2015-07-08 (×3): qty 2

## 2015-07-08 MED ORDER — SODIUM CHLORIDE 0.9 % IJ SOLN
INTRAMUSCULAR | Status: AC
  Filled 2015-07-08: qty 3

## 2015-07-08 MED ORDER — CLINDAMYCIN PHOSPHATE 900 MG/50ML IV SOLN
INTRAVENOUS | Status: AC
  Administered 2015-07-08: 10:00:00
  Filled 2015-07-08: qty 50

## 2015-07-08 MED ORDER — GLIPIZIDE ER 10 MG PO TB24: 10.0000 mg | ORAL_TABLET | Freq: Two times a day (BID) | ORAL | Status: DC

## 2015-07-08 MED ORDER — FENTANYL CITRATE (PF) 100 MCG/2ML IJ SOLN
INTRAMUSCULAR | Status: DC | PRN
  Administered 2015-07-08 (×2): 50 ug via INTRAVENOUS

## 2015-07-08 MED ORDER — MAGNESIUM OXIDE 400 (241.3 MG) MG PO TABS
400.0000 mg | ORAL_TABLET | ORAL | Status: DC
  Administered 2015-07-09: 400 mg via ORAL
  Filled 2015-07-08: qty 1

## 2015-07-08 SURGICAL SUPPLY — 43 items
BAG DECANTER FOR FLEXI CONT (MISCELLANEOUS) ×3 IMPLANT
CABLE SURG 12 DISP A/V CHANNEL (MISCELLANEOUS) ×3 IMPLANT
CANISTER SUCT 1200ML W/VALVE (MISCELLANEOUS) ×3 IMPLANT
CHLORAPREP W/TINT 26ML (MISCELLANEOUS) ×3 IMPLANT
COVER LIGHT HANDLE STERIS (MISCELLANEOUS) ×6 IMPLANT
COVER MAYO STAND STRL (DRAPES) ×3 IMPLANT
COVER PROBE FLX POLY STRL (MISCELLANEOUS) ×3 IMPLANT
DRAPE C-ARM XRAY 36X54 (DRAPES) ×3 IMPLANT
DRESSING TELFA 4X3 1S ST N-ADH (GAUZE/BANDAGES/DRESSINGS) ×3 IMPLANT
DRSG TEGADERM 4X4.75 (GAUZE/BANDAGES/DRESSINGS) ×3 IMPLANT
GLOVE BIO SURGEON STRL SZ7.5 (GLOVE) ×6 IMPLANT
GOWN STRL REUS W/ TWL LRG LVL3 (GOWN DISPOSABLE) ×1 IMPLANT
GOWN STRL REUS W/ TWL XL LVL3 (GOWN DISPOSABLE) ×1 IMPLANT
GOWN STRL REUS W/TWL LRG LVL3 (GOWN DISPOSABLE) ×2
GOWN STRL REUS W/TWL XL LVL3 (GOWN DISPOSABLE) ×2
HANDLE YANKAUER SUCT BULB TIP (MISCELLANEOUS) ×3 IMPLANT
ICD VISIA MRI VR DVFB1D4 (ICD Generator) ×1 IMPLANT
IMMOBILIZER SHDR LG LX 900803 (SOFTGOODS) ×3 IMPLANT
IMMOBILIZER SHDR XL LX WHT (SOFTGOODS) ×3 IMPLANT
INTRO PACEMAKR LEAD 9FR 13CM (INTRODUCER) ×3
INTRODUCER PACEMKR LD 9FR 13CM (INTRODUCER) ×1 IMPLANT
IV NS 1000ML (IV SOLUTION) ×2
IV NS 1000ML BAXH (IV SOLUTION) ×1 IMPLANT
KIT RM TURNOVER STRD PROC AR (KITS) ×3 IMPLANT
LABEL OR SOLS (LABEL) ×3 IMPLANT
LEAD SPRINT QUAT SEC 6935M-62 (Lead) ×3 IMPLANT
NEEDLE FILTER BLUNT 18X 1/2SAF (NEEDLE) ×2
NEEDLE FILTER BLUNT 18X1 1/2 (NEEDLE) ×1 IMPLANT
NEEDLE HYPO 25X1 1.5 SAFETY (NEEDLE) ×3 IMPLANT
NEEDLE SPNL 22GX3.5 QUINCKE BK (NEEDLE) ×3 IMPLANT
NS IRRIG 500ML POUR BTL (IV SOLUTION) ×3 IMPLANT
PACK PACE INSERTION (MISCELLANEOUS) ×3 IMPLANT
PAD GROUND ADULT SPLIT (MISCELLANEOUS) ×3 IMPLANT
PAD STATPAD (MISCELLANEOUS) ×3 IMPLANT
SUT DVC V-LOC 4-0 90 CLR P-12 (SUTURE) ×3
SUT DVC VLOC 3-0 CL 6 P-12 (SUTURE) ×3 IMPLANT
SUT SILK 0 CT 1 30 (SUTURE) ×3 IMPLANT
SUT VIC AB 3-0 PS2 18 (SUTURE) ×3 IMPLANT
SUTURE DVC V-LC4-0 90 CLR P-12 (SUTURE) ×1 IMPLANT
SYR CONTROL 10ML (SYRINGE) ×3 IMPLANT
SYRINGE 10CC LL (SYRINGE) ×3 IMPLANT
TOWEL OR 17X26 4PK STRL BLUE (TOWEL DISPOSABLE) ×3 IMPLANT
VISIA MRI VR DVFB1D4 (ICD Generator) ×3 IMPLANT

## 2015-07-08 NOTE — Progress Notes (Signed)
Patient states he does a sliding scale insulin at home. Notified Dr. Joana Reamer, orders for sliding scale received.

## 2015-07-08 NOTE — Anesthesia Postprocedure Evaluation (Signed)
Anesthesia Post Note  Patient: Daniel [name]  Procedure(s) Performed: Procedure(s) (LRB): ICD GENERATOR CHANGE/INITIAL IMPLANT (Left)  Patient location during evaluation: PACU Anesthesia Type: General Level of consciousness: awake Pain management: pain level controlled Vital Signs Assessment: post-procedure vital signs reviewed and stable Respiratory status: spontaneous breathing Cardiovascular status: blood pressure returned to baseline Postop Assessment: no headache Anesthetic complications: no    Last Vitals:  Filed Vitals:   07/08/15 1302 07/08/15 1317  BP: 131/77 115/78  Pulse: 84 84  Temp:    Resp: 19 15    Last Pain: There were no vitals filed for this visit.               Terrisa Curfman M

## 2015-07-08 NOTE — Progress Notes (Signed)
Left shoulder immobilizer intact from OR. 

## 2015-07-08 NOTE — H&P (Signed)
Referring Provider/Primary Cardiologist: Daniel [name] [facility] [place], [place]   Primary Care Provider: Jonetta Osgood, NP No address on file   Patient Profile:   Daniel [name] is a very pleasant 49 y.o. male who I was asked to see for consideration of a primary prevention ICD. 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 EF 20%   Pyelonephritis ICD-10-CM: N12 ICD-9-CM: 590.80 12/20/2012 - Present     Current Medications:  Current Outpatient Prescriptions  Medication Sig Dispense Refill  . albuterol sulfate 90 mcg/actuation AePB Inhale 180 mcg into the lungs every 4 (four) hours as needed (Inhale 2 puffs every 4-6 hours prn).  Marland Kitchen aspirin 81 MG EC tablet Take 81 mg by mouth once daily.  . carvedilol (COREG) 6.25 MG tablet Take 1  tablet (6.25 mg total) by mouth 2 (two) times daily with meals. 60 tablet 6  . ergocalciferol, vitamin D2, 50,000 unit capsule Take 50,000 Units by mouth once a week.  . FUROsemide (LASIX) 40 MG tablet Take 1 tablet (40 mg total) by mouth once daily. 30 tablet 11  . glipiZIDE (GLUCOTROL XL) 10 MG XL tablet Take 1 tablet twice daily with meals 60 tablet 5  . insulin LISPRO (HUMALOG KWIKPEN) 100 unit/mL pen injector Inject 6-16 units at lunch and dinner as directed 15 mL 3  . lisinopril (PRINIVIL,ZESTRIL) 5 MG tablet Take 1 tablet (5 mg total) by mouth once daily. 30 tablet 6  . magnesium aspartate (MAGINEX) 61 mg (615 mg) tablet Take 615 mg by mouth 2 (two) times daily.  . metFORMIN (GLUCOPHAGE-XR) 500 MG XR tablet Take 1 tablet (500 mg) at supper for 3-4 days and then increase to 2 tablets (1,000 mg) at supper 60 tablet 3  . montelukast (SINGULAIR) 10 mg tablet TAKE ONE TABLET BY MOUTH ONCE DAILY 30 tablet 0  . ONETOUCH ULTRA TEST test strip Use 3 (three) times daily. Use as instructed. 200 each 3  . pen needle, diabetic 31 gauge x 5/16" needle Use one needle three times a day as directed 100 each 12  . sertraline (ZOLOFT) 25 MG tablet Take 1 tablet (25 mg total) by mouth once daily. 30 tablet 11  . spironolactone (ALDACTONE) 25 MG tablet Take 1 tablet (25 mg total) by mouth once daily. 30 tablet 11   No current facility-administered medications for this visit.    Allergies:  Ceftriaxone; Egg; Penicillins; and Simvastatin  History of Present Illness   [name] is a very pleasant 49  y.o. male with HFReF NICM. The patient has had shortness of breath, has had chest pain, has had near syncope, has had syncope, and has had palpitations. The patient has had orthopnea or PND.  He endorses NYHA class II sxs. Initial diagnosis was 05/2014 had syncope and sxs of HF. He has been treated with optimal medical therapy since that time.   MUGA study showed EF 32%. 02/2015 Had episode syncope 04/22/2015,  prior episode 05/2014 No prodrome no significant injuries.   NYHA class remains II-III He is on disabilty  His wife has cancer and is undergoing treatment  + FH of SCD and cardiomyopathy  Family History  Notable for sister with cardiomyopathy EF 35%, father died suddenly.  Social History   Social History   Social History  . Marital status: Married  Spouse name: N/A  . Number of children: N/A  . Years of education: N/A   Occupational History  . Not on file.   Social History Main Topics  . Smoking status: Never Smoker  . Smokeless tobacco: Current User  Comment: 1/4 can snuff daily  . Alcohol use No  . Drug use: No  . Sexual activity: Not on file   Other Topics Concern  . Not on file   Social History Narrative   Review of Systems   Constitutional: negative Eyes: negative Ears, nose, mouth, throat, and face: negative Respiratory: positive for dyspnea on exertion, pleurisy/chest pain and wheezing Cardiovascular: positive for chest pain, chest pressure/discomfort, dyspnea, fatigue, irregular heart beat, near-syncope, orthopnea, palpitations, paroxysmal nocturnal dyspnea, syncope, tachypnea and abdominal distention Gastrointestinal: negative Genitourinary:negative Integument/breast: negative Hematologic/lymphatic: negative Musculoskeletal:negative Neurological: negative Behavioral/Psych: positive for depression and fatigue Endocrine: negative Allergic/Immunologic: negative  Physical Examinations   Vital Signs:  Visit Vitals  . BP 128/82 (BP Location: Left upper arm, Patient Position: Sitting, BP Cuff Size: Large Adult)  . Pulse 93  . Ht 170.2 cm (5\' 7" )  . Wt (!) 140.8 kg (310 lb 8 oz)  . SpO2 98%  . BMI 48.63 kg/m2  Body mass index is 48.63 kg/(m^2). General appearance: He appears well, in no apparent distress. Alert and oriented times three.   Neck: supple, no significant adenopathy, carotids upstroke normal bilaterally, no bruits   Thyroid: thyroid  is normal in size without nodules or tenderness   Lungs: clear to auscultation, no wheezes, rales or rhonchi, symmetric air entry   CV: normal rate and regular rhythm  Abdomen: soft, nontender, nondistended, no masses or organomegaly   Neurological: alert, oriented, normal speech, no focal findings or movement disorder noted   Musculoskeletal: full range of motion without pain, moves all extremities. Ambulates without difficulty.  Extremities:, no pedal edema, no clubbing or cyanosis   ECG  Rhythm: normal sinus rhythm, rate=97 bpm, QTc=417 ms. QRS=114 No ST segment or T-wave abnormalities.  Echocardiogram  Date: 11/18/2014 - Left ventricle: The cavity size was normal. There was moderate  concentric hypertrophy with thinning and akinesis of the anterior  wall. Systolic function was moderately reduced. The estimated  ejection fraction was in the range of 35% to 40%. Doppler  parameters are consistent with abnormal left ventricular  relaxation (grade 1 diastolic dysfunction). The E/e&' ratio is  between 8-15, suggesting indeterminate LV filling pressure.  - Left atrium: The atrium was normal in size.  - Right atrium: The atrium was mildly dilated.  - Tricuspid valve: There was trivial regurgitation.  - Pulmonary arteries: PA peak pressure: 7 mm Hg (S).  - Inferior vena cava: The  vessel was normal in size. The  respirophasic diameter changes were in the normal range (= 50%),  consistent with normal central venous pressure.    Impressions:    - Technically difficult study. Definity contrast was administered.  There is clear anterior scar with akinesis - EF 35-40%, diastolic  dysfunction with indeterminate LV filling pressure.     Labs   Hematology No results found for: WBC, HGB, HCT, MCV, PLT  Chemistries Lab Results  Component Value Date  CREATININE 0.9 04/27/2015  BUN 15 04/27/2015  NA 136 04/27/2015  K 4.2 04/27/2015  CL 97  04/27/2015  CO2 30.9 04/27/2015   Liver Function Studies No results found for: ALT, AST, GGT, ALKPHOS, BILITOT  Thyroid Function Studies No results found for: TSH, T3TOTAL, T4TOTAL, THYROIDAB  INR No results found for: INR  Assessment and Plan    His EF <35%, has had had syncope and +FH; he is at an increased risk of sudden cardiac death and would likely benefit from an ICD. I spent some time with him reviewing the potential benefits and risks of the procedure. Specifically, I informed him that the potential risks include but are not limited to infection, bleeding, pneumothorax, and cardiac perforation with or without tamponade. He verbalized understanding, and he signed the consent form in my presence in clinic today. His procedure is not scheduled at this time. I gave him a scrub brush to use the morning of his procedure. We plan to implant a single coil single-chamber ICD in the left infraclavicular region, and I plan not to do DFT testing. I plan to give him vancomycin and clindamycin before his procedure. He knows to call us should he have any questions or problems at any time. Vendor MDT will be done in [place].     PREPROCEDURAL DIAGNOSIS: Chronic systolic Heart failure  EF: LVEF 30%  NYHA Class: III  QRSD: 116 ms  ADMISSION STATUS Phase III telemetry for overnight observation  PROCEDURE STATUS elective  REFERRING MD Self No address on file  DEVICE VENDOR Medtronic  PLANNED SIDE left  PREPROCEDURAL ANTIOBIOTICS Ancef 2 grams

## 2015-07-08 NOTE — Transfer of Care (Signed)
Immediate Anesthesia Transfer of Care Note  Patient: Daniel [name]  Procedure(s) Performed: Procedure(s): ICD GENERATOR CHANGE/INITIAL IMPLANT (Left)  Patient Location: PACU  Anesthesia Type:MAC  Level of Consciousness: awake, alert , oriented and patient cooperative  Airway & Oxygen Therapy: Patient Spontanous Breathing  Post-op Assessment: Report given to RN, Post -op Vital signs reviewed and stable and Patient moving all extremities  Post vital signs: Reviewed and stable  Last Vitals:  Filed Vitals:   07/08/15 0950 07/08/15 1246  BP: 155/98 129/86  Pulse: 90 89  Temp: 36.7 C 36.3 C  Resp: 18 13    Complications: No apparent anesthesia complications

## 2015-07-08 NOTE — Progress Notes (Signed)
Patient admitted today for an ICD placement. Vitals are stable. Pain treated with morphine and percocet with some relief. Patient is pretty comfortable while he is still, but when moving is pretty uncomfortable. No other needs at this time. Instructed to call for assistance to stand due to being sedated in the last 24 hours. Patient set up and oriented to the room. Skin verified with Robyn Haber, RN. Will continue to monitor.

## 2015-07-08 NOTE — Anesthesia Preprocedure Evaluation (Signed)
Anesthesia Evaluation  Patient identified by MRN, date of birth, ID band Patient awake    Reviewed: Allergy & Precautions, NPO status , Patient's Chart, lab work & pertinent test results  Airway Mallampati: I  TM Distance: >3 FB Neck ROM: Full    Dental  (+) Teeth Intact   Pulmonary asthma ,    breath sounds clear to auscultation       Cardiovascular Exercise Tolerance: Poor hypertension, Pt. on medications and Pt. on home beta blockers +CHF   Rhythm:Regular Rate:Normal  Witnessed cardiac arrest. CHF, cardiomyopathy.   Neuro/Psych    GI/Hepatic   Endo/Other  diabetes, Type obesityBG 199.  Renal/GU      Musculoskeletal   Abdominal (+) + obese,  Abdomen: soft.    Peds  Hematology   Anesthesia Other Findings   Reproductive/Obstetrics                             Anesthesia Physical Anesthesia Plan  ASA: IV  Anesthesia Plan: MAC   Post-op Pain Management:    Induction: Intravenous  Airway Management Planned: Simple Face Mask and Nasal Cannula  Additional Equipment:   Intra-op Plan:   Post-operative Plan:   Informed Consent: I have reviewed the patients History and Physical, chart, labs and discussed the procedure including the risks, benefits and alternatives for the proposed anesthesia with the patient or authorized representative who has indicated his/her understanding and acceptance.     Plan Discussed with: CRNA and Surgeon  Anesthesia Plan Comments:         Anesthesia Quick Evaluation

## 2015-07-08 NOTE — Op Note (Signed)
Daniel [name] [id number]  [id number]  Preop KZ:[id number] systolic HF; syncope Postop Dx same/ICD in situ   Procedure: single chamber ICD implantation   Following the obtaining of informed consent the patient was brought to the electrophysiology laboratory in place of the fluoroscopic table in the supine position.After routine prep and drape, lidocaine was infiltrated in the prepectoral subclavicular region and an incision was made and carried down to the layer of the prepectoral fascia using electrocautery and sharp dissection. A pocket was formed similarly.  Thereafter an attention was turned to gain access to the extrathoracic left axillaryvein which was accomplished without  difficulty and without the aspiration of air or puncture of the artery. A 9 French sheath was placed for which was then passed a single coil  active fixation defibrillator lead, model 6935M serial number [id number]. It was  passed under fluoroscopic guidance to the right ventricular septum. In its location the bipolar R wave was 10.6 millivolts, impedance was  635ohms, the pacing threshold was 0.6volts at 0.5 msec. Current at threshold was 1.2 mA.  There was no diaphragmatic pacing at 10 V. The current of injury was brisk.  The lead was secured to the prepectoral fascia and then attached to a Visia AFbVR MDT ICD, serial number  [id number].  Through the device, the bipolar R wave was 16.6millivolts.   The pocket was copiously irrigated with antibiotic containing saline solution. Hemostasis was assured, and the device and the lead was placed in the pocket and secured to the prepectoral fascia.  The wound was closed in 3  layers in normal fashion. 2.0 vicry and 3,4.0 V lock. The wound was washed dried and  Benzoin applied followed by Steri-Strips dressing was then applied. Needle counts sponge counts and instrument counts were correct at the end of the procedure according to the staff.  At this point, I scrubbed out of the  procedure.   Patient tolerated the procedure without apparent complication  EBL  Minimal    Final programmed parameters VVI 40 VF 214 30/40 VT monitor 171.       Junior Huezo L., MD 07/08/2015 2:37 PM

## 2015-07-08 NOTE — Progress Notes (Signed)
Notified Dr. Joana Reamer that patient does not have a diet order or any pain medication post ICD placement. Patient has tolerated clear liquids since surgery, so physician gave orders for diet. Also received orders for pain medications. Will treat pain now.

## 2015-07-09 ENCOUNTER — Observation Stay: Payer: PRIVATE HEALTH INSURANCE

## 2015-07-09 DIAGNOSIS — I5022 Chronic systolic (congestive) heart failure: Secondary | ICD-10-CM | POA: Diagnosis not present

## 2015-07-09 LAB — GLUCOSE, CAPILLARY: GLUCOSE-CAPILLARY: 139 mg/dL — AB (ref 65–99)

## 2015-07-09 NOTE — Discharge Summary (Signed)
[facility] Cardiology Discharge Summary  Patient ID: Daniel [name] MRN: [id number] DOB/AGE: [birth year] 49 y.o.  Admit date: 07/08/2015 Discharge date: 07/09/2015  Primary Discharge Diagnosis: Chronic systolic dysfunction congestive heart failure Secondary Discharge Diagnosis diabetes, high blood pressure and high cholesterol  Significant Diagnostic Studies:  ICD placement due to above diagnoses Hospital Course:  The patient was admitted to the hospital for chronic systolic dysfunction congestive heart failure with ejection fraction of 20%. The patient has been on appropriate medication management and has had New York Heart Association class II symptoms. The patient has been stable at this time and it was recommended that the patient have an ICD placement for further risk reduction in cardiovascular event. The patient underwent ICD placement without complication with procedure note in the chart. The patient tolerated the procedure well without evidence of other significant side effects or symptoms. The patient was replaced on appropriate medication management for further risk reduction cardiovascular event and has ambulated well without further significant concerns patient has reached his maximal hospital benefit and was discharged to home in good condition with follow-up in 2 weeks.   Discharge Exam: Blood pressure 114/82, pulse 81, temperature 97.5 F (36.4 C), temperature source Oral, resp. rate 22, height  (1.727 m), weight 312 lb 8 oz (141.749 kg), SpO2 90 %.  Constitutional: Alet oriented to person, place, and time. No distress.  HENT: No nasal discharge.  Head: Normocephalic and atraumatic.  Eyes: Pupils are equal and round. No discharge.  Neck: Normal range of motion. Neck supple. No JVD present. No thyromegaly present.  Cardiovascular: Normal rate, regular rhythm, normal S1 S2, no gallop, no friction rub. No murmur Pulmonary/Chest: Effort normal, No stridor. No respiratory  distress. no wheezes.  no rales.    Abdominal: Soft. Bowel sounds are normal.  no distension.  no tenderness. There is no rebound and no guarding.  Musculoskeletal: No edema, no cyanosis, normal pulses, no bleeding, Normal range of motion. no tenderness.  Neurological:  alert and oriented to person, place, and time. Coordination normal.  Skin: Skin is warm and dry. No rash noted. No erythema. No pallor.  Psychiatric:  normal mood and affect. behavior is normal.    Labs:   Lab Results  Component Value Date   WBC 14.3* 06/21/2015   HGB 14.4 06/21/2015   HCT 44.1 06/21/2015   MCV 85.5 06/21/2015   PLT 296 06/21/2015   No results for input(s): NA, K, CL, CO2, BUN, CREATININE, CALCIUM, PROT, BILITOT, ALKPHOS, ALT, AST, GLUCOSE in the last 168 hours.  Invalid input(s): LABALBU  EKG: NSR without evidence of new changes  FOLLOW UP IN ONE TO TWO WEEKS    Medication List    TAKE these medications        albuterol 108 (90 BASE) MCG/ACT inhaler  Commonly known as:  PROVENTIL HFA;VENTOLIN HFA  Inhale 2 puffs into the lungs every 6 (six) hours as needed for wheezing or shortness of breath.     aspirin EC 81 MG tablet  Take 81 mg by mouth every morning.     carvedilol 6.25 MG tablet  Commonly known as:  COREG  Take 6.25 mg by mouth 2 (two) times daily with a meal.     furosemide 20 MG tablet  Commonly known as:  LASIX  Take 20 mg by mouth daily. Pt can take up to an addition 20 mg if needed for weight gain of 3-5 lbs.     glipiZIDE 10 MG 24 hr tablet  Commonly known as:  GLUCOTROL XL  Take 10 mg by mouth 2 (two) times daily.     insulin lispro 100 UNIT/ML injection  Commonly known as:  HUMALOG  Inject 6 Units into the skin 2 (two) times daily. Sliding scale 6-16 units at lunch and dinner.     lisinopril 5 MG tablet  Commonly known as:  PRINIVIL,ZESTRIL  Take 5 mg by mouth daily.     MAGNESIUM PO  Take 1 tablet by mouth every morning.     metFORMIN 500 MG (MOD) 24 hr  tablet  Commonly known as:  GLUMETZA  Take 1,000 mg by mouth 2 (two) times daily.     sertraline 50 MG tablet  Commonly known as:  ZOLOFT  Take 50 mg by mouth at bedtime.     SINGULAIR 10 MG tablet  Generic drug:  montelukast  Take 10 mg by mouth every morning.     spironolactone 25 MG tablet  Commonly known as:  ALDACTONE  Take 25 mg by mouth every morning.           Follow-up Information    Follow up with Harold Hedge A., MD. Call in 14 days.   Specialty:  Cardiology   Why:  heart failure follow up   Contact information:   [address] [facility] - CARDIOLOGY [place] [address] [phone]       THE PATIENT  SHALL BRING ALL MEDICATIONS TO FOLLOW UP APPOINTMENT  Signed:  Lamar Blinks MD, [facility] 07/09/2015, 8:04 AM

## 2015-07-09 NOTE — Progress Notes (Signed)
Pt is a&o, VSS, NSR on tele with no complaint of pain or discomfort. Drainage from ICD marked, none new and dressing changed. Order to d/c pt to home. Discharge instructions given to pt and wife, no new prescriptions. Pt to be escorted off unit via wheelchair by nursing.

## 2015-08-23 ENCOUNTER — Other Ambulatory Visit: Payer: Self-pay | Admitting: Physician Assistant

## 2015-09-09 DIAGNOSIS — I420 Dilated cardiomyopathy: Secondary | ICD-10-CM | POA: Insufficient documentation

## 2015-09-09 DIAGNOSIS — E119 Type 2 diabetes mellitus without complications: Secondary | ICD-10-CM | POA: Insufficient documentation

## 2015-09-19 DIAGNOSIS — Z95810 Presence of automatic (implantable) cardiac defibrillator: Secondary | ICD-10-CM | POA: Insufficient documentation

## 2015-09-23 ENCOUNTER — Other Ambulatory Visit: Payer: Self-pay

## 2015-09-23 DIAGNOSIS — Z0000 Encounter for general adult medical examination without abnormal findings: Secondary | ICD-10-CM

## 2015-09-23 NOTE — Progress Notes (Signed)
Patient came in to have blood drawn per the following physician's orders:  Dr. Lady Gary, Dr. Tedd Sias and Dr Alan Mulder.  Blood was drawn from the right arm without any incident.

## 2015-09-24 LAB — CMP12+LP+TP+TSH+6AC+CBC/D/PLT
A/G RATIO: 1.3 (ref 1.1–2.5)
ALT: 21 IU/L (ref 0–44)
AST: 11 IU/L (ref 0–40)
Albumin: 4 g/dL (ref 3.5–5.5)
Alkaline Phosphatase: 109 IU/L (ref 39–117)
BASOS ABS: 0.1 10*3/uL (ref 0.0–0.2)
BILIRUBIN TOTAL: 0.2 mg/dL (ref 0.0–1.2)
BUN / CREAT RATIO: 24 — AB (ref 9–20)
BUN: 20 mg/dL (ref 6–24)
Basos: 0 %
CHOLESTEROL TOTAL: 187 mg/dL (ref 100–199)
Calcium: 9.2 mg/dL (ref 8.7–10.2)
Chloride: 97 mmol/L (ref 96–106)
Chol/HDL Ratio: 5.1 ratio units — ABNORMAL HIGH (ref 0.0–5.0)
Creatinine, Ser: 0.84 mg/dL (ref 0.76–1.27)
EOS (ABSOLUTE): 0.5 10*3/uL — AB (ref 0.0–0.4)
EOS: 4 %
Estimated CHD Risk: 1 times avg. (ref 0.0–1.0)
FREE THYROXINE INDEX: 2.5 (ref 1.2–4.9)
GFR calc non Af Amer: 104 mL/min/{1.73_m2} (ref >59)
GFR, EST AFRICAN AMERICAN: 121 mL/min/{1.73_m2} (ref >59)
GGT: 30 IU/L (ref 0–65)
GLOBULIN, TOTAL: 3.1 g/dL (ref 1.5–4.5)
GLUCOSE: 197 mg/dL — AB (ref 65–99)
HDL: 37 mg/dL — AB (ref >39)
HEMOGLOBIN: 14.2 g/dL (ref 12.6–17.7)
Hematocrit: 42.4 % (ref 37.5–51.0)
IMMATURE GRANULOCYTES: 1 %
IRON: 43 ug/dL (ref 38–169)
Immature Grans (Abs): 0.1 10*3/uL (ref 0.0–0.1)
LDH: 161 IU/L (ref 121–224)
LDL Calculated: 119 mg/dL — ABNORMAL HIGH (ref 0–99)
LYMPHS: 18 %
Lymphocytes Absolute: 2.6 10*3/uL (ref 0.7–3.1)
MCH: 28.2 pg (ref 26.6–33.0)
MCHC: 33.5 g/dL (ref 31.5–35.7)
MCV: 84 fL (ref 79–97)
MONOCYTES: 7 %
Monocytes Absolute: 0.9 10*3/uL (ref 0.1–0.9)
NEUTROS PCT: 70 %
Neutrophils Absolute: 10.2 10*3/uL — ABNORMAL HIGH (ref 1.4–7.0)
PLATELETS: 324 10*3/uL (ref 150–379)
Phosphorus: 4.5 mg/dL (ref 2.5–4.5)
Potassium: 4.4 mmol/L (ref 3.5–5.2)
RBC: 5.04 x10E6/uL (ref 4.14–5.80)
RDW: 15.1 % (ref 12.3–15.4)
Sodium: 138 mmol/L (ref 134–144)
T3 UPTAKE RATIO: 29 % (ref 24–39)
T4 TOTAL: 8.6 ug/dL (ref 4.5–12.0)
TOTAL PROTEIN: 7.1 g/dL (ref 6.0–8.5)
TRIGLYCERIDES: 154 mg/dL — AB (ref 0–149)
TSH: 2.4 u[IU]/mL (ref 0.450–4.500)
URIC ACID: 4.9 mg/dL (ref 3.7–8.6)
VLDL CHOLESTEROL CAL: 31 mg/dL (ref 5–40)
WBC: 14.3 10*3/uL — AB (ref 3.4–10.8)

## 2015-09-24 LAB — MICROALBUMIN / CREATININE URINE RATIO
Creatinine, Urine: 27 mg/dL
MICROALB/CREAT RATIO: 30 mg/g{creat} (ref 0.0–30.0)
MICROALBUM., U, RANDOM: 8.1 ug/mL

## 2015-09-24 LAB — VITAMIN D 25 HYDROXY (VIT D DEFICIENCY, FRACTURES): Vit D, 25-Hydroxy: 27.6 ng/mL — ABNORMAL LOW (ref 30.0–100.0)

## 2015-09-24 LAB — HGB A1C W/O EAG: HEMOGLOBIN A1C: 8.2 % — AB (ref 4.8–5.6)

## 2015-09-26 NOTE — Progress Notes (Signed)
Lab results were sent to Dr. Lady Gary, Dr. Tedd Sias and Dr. Irving Burton Dolleschel per patient's request.

## 2015-09-27 ENCOUNTER — Ambulatory Visit: Payer: Self-pay | Admitting: Registered Nurse

## 2015-09-27 VITALS — BP 120/70 | HR 111 | Temp 98.8°F

## 2015-09-27 DIAGNOSIS — R309 Painful micturition, unspecified: Secondary | ICD-10-CM

## 2015-09-27 DIAGNOSIS — H6593 Unspecified nonsuppurative otitis media, bilateral: Secondary | ICD-10-CM

## 2015-09-27 DIAGNOSIS — J302 Other seasonal allergic rhinitis: Secondary | ICD-10-CM

## 2015-09-27 DIAGNOSIS — N3001 Acute cystitis with hematuria: Secondary | ICD-10-CM

## 2015-09-27 LAB — POCT URINALYSIS DIPSTICK
Bilirubin, UA: NEGATIVE
Nitrite, UA: POSITIVE
SPEC GRAV UA: 1.025
UROBILINOGEN UA: 0.2
pH, UA: 5.5

## 2015-09-27 MED ORDER — PHENAZOPYRIDINE HCL 200 MG PO TABS: 200.0000 mg | ORAL_TABLET | Freq: Three times a day (TID) | ORAL | Status: DC

## 2015-09-27 MED ORDER — SALINE SPRAY 0.65 % NA SOLN: 2.0000 | NASAL | Status: DC | PRN

## 2015-09-27 MED ORDER — SULFAMETHOXAZOLE-TRIMETHOPRIM 800-160 MG PO TABS: 1.0000 | ORAL_TABLET | Freq: Two times a day (BID) | ORAL | Status: AC

## 2015-09-27 MED ORDER — FLUTICASONE PROPIONATE 50 MCG/ACT NA SUSP: 1.0000 | Freq: Two times a day (BID) | NASAL | Status: DC

## 2015-09-27 NOTE — Progress Notes (Signed)
Subjective:    Patient ID: Daniel [name], male    DOB: 11-01-1965, 50 y.o.   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 The current episode started in the past 7 days. The problem occurs every urination. The problem has been gradually worsening. The quality of the pain is described as aching, burning and stabbing. The pain is moderate. The maximum temperature recorded prior to his arrival was 100 - 100.9 F. The fever has been present for 1 - 2 days. He is sexually active. There is no history of pyelonephritis. Associated symptoms include chills, frequency, hematuria and nausea. Pertinent negatives include no discharge, flank pain, hesitancy, possible pregnancy, sweats, urgency or vomiting. He has tried increased fluids and NSAIDs for the symptoms. The treatment provided no relief. His past medical history is significant for recurrent UTIs. There is no history of catheterization, kidney stones, a single kidney, urinary stasis or a urological procedure.  Hematuria Irritative symptoms include frequency. Irritative symptoms do not include urgency. Associated symptoms include abdominal pain, chills, dysuria and nausea. Pertinent negatives include no facial swelling, fever, flank pain, hesitancy or vomiting. There is no history of kidney stones.  Dysuria  Associated symptoms include chills, frequency, hematuria and nausea. Pertinent negatives include no discharge, flank pain, hesitancy, possible pregnancy, sweats, urgency or vomiting. His past medical history is significant for recurrent UTIs. There is no history of catheterization, kidney stones, a single kidney, urinary stasis or a urological procedure.      Review of Systems  Constitutional: Positive for chills. Negative for  fever, diaphoresis, activity change, appetite change, fatigue and unexpected weight change.  HENT: Positive for congestion, postnasal drip and sinus pressure. Negative for dental problem, drooling, ear discharge, ear pain, facial swelling, hearing loss, mouth sores, nosebleeds, rhinorrhea, sneezing, sore throat, tinnitus, trouble swallowing and voice change.   Eyes: Negative for photophobia, pain, discharge, redness, itching and visual disturbance.  Respiratory: Positive for cough. Negative for choking, chest tightness, shortness of breath, wheezing and stridor.   Cardiovascular: Negative for chest pain, palpitations and leg swelling.  Gastrointestinal: Positive for nausea and abdominal pain. Negative for vomiting, diarrhea, constipation, blood in stool and abdominal distention.  Endocrine: Negative for cold intolerance and heat intolerance.  Genitourinary: Positive for dysuria, frequency and hematuria. Negative for hesitancy, urgency, flank pain, decreased urine volume, discharge, penile swelling, scrotal swelling, genital sores, penile pain and testicular pain.  Musculoskeletal: Negative for myalgias, back pain, joint swelling, arthralgias, gait problem, neck pain and neck stiffness.  Skin: Negative for color change, pallor, rash and wound.  Allergic/Immunologic: Positive for environmental allergies. Negative for food allergies and immunocompromised state.  Neurological: Negative for dizziness, tremors, seizures, syncope, facial asymmetry, speech difficulty, weakness, light-headedness, numbness and headaches.  Hematological: Negative for adenopathy. Does not bruise/bleed easily.  Psychiatric/Behavioral: Negative for behavioral problems, confusion, sleep disturbance and agitation.       Objective:   Physical Exam  Constitutional: He is oriented to person, place, and time. Vital signs are normal. He appears well-developed and well-nourished. He is active and cooperative.  Non-toxic appearance. He  does not have a sickly appearance. He appears ill. No distress.  HENT:  Head: Normocephalic and atraumatic.  Right Ear: Hearing, external ear and ear canal normal. A middle ear effusion is present.  Left Ear: Hearing,  external ear and ear canal normal. A middle ear effusion is present.  Nose: Mucosal edema and rhinorrhea present. No nose lacerations, sinus tenderness, nasal deformity, septal deviation or nasal septal hematoma. No epistaxis.  No foreign bodies. Right sinus exhibits no maxillary sinus tenderness and no frontal sinus tenderness. Left sinus exhibits no maxillary sinus tenderness and no frontal sinus tenderness.  Mouth/Throat: Uvula is midline and mucous membranes are normal. Mucous membranes are not pale, not dry and not cyanotic. He does not have dentures. No oral lesions. No trismus in the jaw. Normal dentition. No dental abscesses, uvula swelling, lacerations or dental caries. Posterior oropharyngeal edema and posterior oropharyngeal erythema present. No oropharyngeal exudate or tonsillar abscesses.  Cobblestoning posterior pharynx; bilateral TMs with air fluid level slight opacity; bilateral nasal turbinates edema/erythema/yellow discharge  Eyes: Conjunctivae, EOM and lids are normal. Pupils are equal, round, and reactive to light. Right eye exhibits no chemosis, no discharge, no exudate and no hordeolum. No foreign body present in the right eye. Left eye exhibits no chemosis, no discharge, no exudate and no hordeolum. No foreign body present in the left eye. Right conjunctiva is not injected. Right conjunctiva has no hemorrhage. Left conjunctiva is not injected. Left conjunctiva has no hemorrhage. No scleral icterus. Right eye exhibits normal extraocular motion and no nystagmus. Left eye exhibits normal extraocular motion and no nystagmus. Right pupil is round and reactive. Left pupil is round and reactive. Pupils are equal.  Neck: Trachea normal and normal range of motion. Neck supple. No  tracheal tenderness, no spinous process tenderness and no muscular tenderness present. No rigidity. No tracheal deviation, no edema, no erythema and normal range of motion present. No thyroid mass and no thyromegaly present.  Cardiovascular: Normal rate, regular rhythm, S1 normal, S2 normal, normal heart sounds and intact distal pulses.  PMI is not displaced.  Exam reveals no gallop and no friction rub.   No murmur heard. Pulmonary/Chest: Effort normal and breath sounds normal. No stridor. No respiratory distress. He has no decreased breath sounds. He has no wheezes. He has no rhonchi. He has no rales.  Abdominal: Soft. Normal appearance and bowel sounds are normal. He exhibits no distension and no mass. There is no hepatosplenomegaly. There is tenderness in the left lower quadrant. There is no rigidity, no rebound, no guarding, no CVA tenderness, no tenderness at McBurney's point and negative Murphy's sign. Hernia confirmed negative in the ventral area.    Dull to percussion x 4 quads  Musculoskeletal: Normal range of motion. He exhibits no edema or tenderness.  Lymphadenopathy:       Head (right side): No submental, no submandibular, no tonsillar, no preauricular, no posterior auricular and no occipital adenopathy present.       Head (left side): No submental, no submandibular, no tonsillar, no preauricular, no posterior auricular and no occipital adenopathy present.    He has no cervical adenopathy.       Right cervical: No superficial cervical, no deep cervical and no posterior cervical adenopathy present.      Left cervical: No superficial cervical, no deep cervical and no posterior cervical adenopathy present.  Neurological: He is alert and oriented to person, place, and time. He displays no atrophy and no tremor. No cranial nerve deficit or sensory deficit. He exhibits normal muscle tone. He displays no seizure activity. Coordination and gait normal. GCS eye subscore is 4. GCS verbal subscore  is 5. GCS motor subscore is 6.  Skin: Skin is warm, dry and  intact. No abrasion, no bruising, no burn, no ecchymosis, no laceration, no lesion, no petechiae and no rash noted. He is not diaphoretic. No cyanosis or erythema. No pallor. Nails show no clubbing.  Psychiatric: He has a normal mood and affect. His speech is normal and behavior is normal. Judgment and thought content normal. Cognition and memory are normal.  Nursing note and vitals reviewed.   1430 discussed urinalysis results with patient abnormal given copy and to follow up for repeat urinalysis after antibiotics completed sooner if worsening symptoms on antibiotics x 48 hours or if unable to void every 8 hours.  Reviewed [place] Controlled substances website and no Rx dispensed in previous year to patient.  Patient stated last narcotics 4 years ago with UTI percocet.      Assessment & Plan:  A-UTI, gross hematuria, otitis media effusion, rhinosinusitis P-bactrim DS po BID x 14 days; urine culture typically results available 48 hours will call with results once available; pyridium 200mg  po TID prn x 48 hours; push po fluids, blood sugar control (diabetes uncontrolled probable cause repetitive UTIs) consider imaging/serum labs if worsening symptoms on follow up evaluation  Percocet 10mg  po BID prn severe pain.  Tylenol 1000mg  po QID prn pain/fever. Avoid driving and alcohol intake if taking percocet. Medications as directed.  Patient is also to push fluids and may use Pyridium 200mg  po TID as needed.  Hydrate, avoid dehydration.  Avoid holding urine void on frequent basis every 4 to 6 hours.  If unable to void every 8 hours follow up for re-evaluation with PCM, urgent care or ER.   Call or return to clinic as needed if these symptoms worsen or fail to improve as anticipated.  Patient verbalized agreement and understanding of treatment plan and had no further questions at this time. P2:  Hydrate and cranberry juice   may ease general aches and  pains.    Supportive treatment.   No evidence of invasive bacterial infection, non toxic and well hydrated.  This is most likely self limiting viral infection.  I do not see where any further testing or imaging is necessary at this time.   I will suggest supportive care, rest, good hygiene and encourage the patient to take adequate fluids.  The patient is to return to clinic or EMERGENCY ROOM if symptoms worsen or change significantly e.g. ear pain, fever, purulent discharge from ears or bleeding.  Patient verbalized agreement and understanding of treatment plan.      flonase 1 spray each nostril BID, nasal saline 2 sprays each nostril q2h prn congestion.  No evidence of systemic bacterial infection, non toxic and well hydrated.  I do not see where any further testing or imaging is necessary at this time.   I will suggest supportive care, rest, good hygiene and encourage the patient to take adequate fluids.  The patient is to return to clinic or EMERGENCY ROOM if symptoms worsen or change significantly.  Patient verbalized agreement and understanding of treatment plan and had no further questions at this time.   P2:  Hand washing and cover cough   I have recommended clear fluids and bland diet.  Avoid dairy/spicy, fried and large portions of meat while having nausea.  If vomiting hold po intake x 1 hour.  Then sips clear fluids like broths, ginger ale, power ade, gatorade, pedialyte may advance to soft/bland if no vomiting x 24 hours and appetite returned otherwise hydration main focus.     Return to the clinic if  symptoms persist or worsen; I have alerted the patient to call if high fever, dehydration, marked weakness, fainting, increased abdominal pain, blood in stool or vomit (red or black).   Exitcare handout on gastroenteritis given to patient. Spouse and Patient verbalized agreement and understanding of treatment plan and had no further questions at this time.

## 2015-09-29 ENCOUNTER — Telehealth: Payer: Self-pay

## 2015-09-29 LAB — URINE CULTURE

## 2015-09-29 NOTE — Telephone Encounter (Signed)
Patient contacted via telephone and notified urine culture e. Coli not resistant to Bactrim DS.  Patient reported he has been taking bactrim ds and pyridium as prescribed symptoms improved within 4 hours of starting medications but not yet completely resolved.  Discussed with patient he is to follow up for repeat urinalysis once all medication complete--take entire course.  Sooner if worsening symptoms.  Continue to push po fluids/hydrate.  Patient verbalized understanding of information/instructions, agreed with plan of care and had no further questions at this time.

## 2015-12-05 ENCOUNTER — Ambulatory Visit: Payer: Self-pay | Admitting: Physician Assistant

## 2015-12-05 ENCOUNTER — Encounter: Payer: Self-pay | Admitting: Physician Assistant

## 2015-12-05 VITALS — BP 119/75 | HR 110 | Temp 98.7°F

## 2015-12-05 DIAGNOSIS — R05 Cough: Secondary | ICD-10-CM

## 2015-12-05 DIAGNOSIS — R062 Wheezing: Secondary | ICD-10-CM

## 2015-12-05 DIAGNOSIS — R059 Cough, unspecified: Secondary | ICD-10-CM

## 2015-12-05 DIAGNOSIS — Z299 Encounter for prophylactic measures, unspecified: Secondary | ICD-10-CM

## 2015-12-05 DIAGNOSIS — J069 Acute upper respiratory infection, unspecified: Secondary | ICD-10-CM

## 2015-12-05 MED ORDER — METHYLPREDNISOLONE 4 MG PO TBPK: ORAL_TABLET | ORAL | Status: DC

## 2015-12-05 MED ORDER — HYDROCOD POLST-CPM POLST ER 10-8 MG/5ML PO SUER: 5.0000 mL | Freq: Two times a day (BID) | ORAL | Status: DC | PRN

## 2015-12-05 MED ORDER — ALBUTEROL SULFATE HFA 108 (90 BASE) MCG/ACT IN AERS: 1.0000 | INHALATION_SPRAY | Freq: Four times a day (QID) | RESPIRATORY_TRACT | Status: AC | PRN

## 2015-12-05 MED ORDER — LEVOFLOXACIN 500 MG PO TABS: 500.0000 mg | ORAL_TABLET | Freq: Every day | ORAL | Status: DC

## 2015-12-05 NOTE — Progress Notes (Signed)
S c/o SOB , wheezing x 2 days, with URI/sinus  Sxs vs ? Aspirated with GI bug , nasal discharge yellow/green now in chest   , inhaler out of date ;known cardiomyopathy , CHF  Reports 7 lb wt gain today - took  40 mg Lasix today, no change in dyspnea, no edema LEs  O/ initially with obvious breathlessness but pleasant and talkative, sob  Resolved after duoneb ENT  Unremarkable x slight sinus tenderness, neck supple Heart rsr , lungs decreased BS /insp and exp wheezes throughout , after neb - improved BS and wheezing resolved and subj improvement   A/ URI and or aspiration with wheezing  Multiple chronic problems - has appt with PCPs next two days.  P Rx levaquin, albuterol inhaler, medrol dose pack, and tussionex 1 tsp q 12 hours prn #100 o rf.  He states he has sliding scale for elevated blood sugars and requests pred pack. Supportive measures . If not improving seek urgent care . F/u with PCPs as planned.

## 2015-12-05 NOTE — Addendum Note (Signed)
Addended by: Catha Brow T on: 12/05/2015 10:46 AM   Modules accepted: Orders

## 2015-12-06 LAB — CMP12+LP+TP+TSH+6AC+CBC/D/PLT
A/G RATIO: 1.4 (ref 1.2–2.2)
ALT: 23 IU/L (ref 0–44)
AST: 15 IU/L (ref 0–40)
Albumin: 4.2 g/dL (ref 3.5–5.5)
Alkaline Phosphatase: 108 IU/L (ref 39–117)
BASOS ABS: 0 10*3/uL (ref 0.0–0.2)
BILIRUBIN TOTAL: 0.3 mg/dL (ref 0.0–1.2)
BUN/Creatinine Ratio: 19 (ref 9–20)
BUN: 18 mg/dL (ref 6–24)
Basos: 0 %
CHLORIDE: 95 mmol/L — AB (ref 96–106)
CHOL/HDL RATIO: 5.1 ratio — AB (ref 0.0–5.0)
CREATININE: 0.96 mg/dL (ref 0.76–1.27)
Calcium: 9.7 mg/dL (ref 8.7–10.2)
Cholesterol, Total: 204 mg/dL — ABNORMAL HIGH (ref 100–199)
EOS (ABSOLUTE): 0.4 10*3/uL (ref 0.0–0.4)
EOS: 3 %
Estimated CHD Risk: 1.1 times avg. — ABNORMAL HIGH (ref 0.0–1.0)
Free Thyroxine Index: 2.4 (ref 1.2–4.9)
GFR, EST AFRICAN AMERICAN: 108 mL/min/{1.73_m2} (ref >59)
GFR, EST NON AFRICAN AMERICAN: 94 mL/min/{1.73_m2} (ref >59)
GGT: 32 IU/L (ref 0–65)
GLUCOSE: 228 mg/dL — AB (ref 65–99)
Globulin, Total: 3 g/dL (ref 1.5–4.5)
HDL: 40 mg/dL (ref >39)
HEMATOCRIT: 45.4 % (ref 37.5–51.0)
HEMOGLOBIN: 14.5 g/dL (ref 12.6–17.7)
IMMATURE GRANS (ABS): 0.1 10*3/uL (ref 0.0–0.1)
Immature Granulocytes: 1 %
Iron: 66 ug/dL (ref 38–169)
LDH: 209 IU/L (ref 121–224)
LDL Calculated: 114 mg/dL — ABNORMAL HIGH (ref 0–99)
Lymphocytes Absolute: 2.2 10*3/uL (ref 0.7–3.1)
Lymphs: 19 %
MCH: 28 pg (ref 26.6–33.0)
MCHC: 31.9 g/dL (ref 31.5–35.7)
MCV: 88 fL (ref 79–97)
MONOCYTES: 9 %
Monocytes Absolute: 1 10*3/uL — ABNORMAL HIGH (ref 0.1–0.9)
NEUTROS ABS: 8 10*3/uL — AB (ref 1.4–7.0)
Neutrophils: 68 %
PHOSPHORUS: 4.3 mg/dL (ref 2.5–4.5)
POTASSIUM: 4.8 mmol/L (ref 3.5–5.2)
Platelets: 293 10*3/uL (ref 150–379)
RBC: 5.18 x10E6/uL (ref 4.14–5.80)
RDW: 15.6 % — ABNORMAL HIGH (ref 12.3–15.4)
SODIUM: 140 mmol/L (ref 134–144)
T3 Uptake Ratio: 26 % (ref 24–39)
T4, Total: 9.2 ug/dL (ref 4.5–12.0)
TSH: 2.26 u[IU]/mL (ref 0.450–4.500)
Total Protein: 7.2 g/dL (ref 6.0–8.5)
Triglycerides: 252 mg/dL — ABNORMAL HIGH (ref 0–149)
URIC ACID: 5.1 mg/dL (ref 3.7–8.6)
VLDL CHOLESTEROL CAL: 50 mg/dL — AB (ref 5–40)
WBC: 11.6 10*3/uL — ABNORMAL HIGH (ref 3.4–10.8)

## 2015-12-06 LAB — HGB A1C W/O EAG: Hgb A1c MFr Bld: 9 % — ABNORMAL HIGH (ref 4.8–5.6)

## 2016-04-18 ENCOUNTER — Other Ambulatory Visit: Payer: Self-pay

## 2016-04-18 DIAGNOSIS — Z299 Encounter for prophylactic measures, unspecified: Secondary | ICD-10-CM

## 2016-04-18 NOTE — Progress Notes (Signed)
Patient came in to have blood drawn for testing per Dr. Pricilla Handler orders.  Patient wants lab results faxed to Dr. Tedd Sias and [facility] and a copy faxed to his regular provider, Tacey Ruiz, FNP at [facility].

## 2016-04-19 LAB — CMP12+LP+TP+TSH+6AC+CBC/D/PLT
A/G RATIO: 1.3 (ref 1.2–2.2)
ALBUMIN: 3.8 g/dL (ref 3.5–5.5)
ALK PHOS: 96 IU/L (ref 39–117)
ALT: 19 IU/L (ref 0–44)
AST: 13 IU/L (ref 0–40)
BASOS: 0 %
BUN / CREAT RATIO: 20 (ref 9–20)
BUN: 16 mg/dL (ref 6–24)
Basophils Absolute: 0 10*3/uL (ref 0.0–0.2)
CALCIUM: 9.1 mg/dL (ref 8.7–10.2)
CHOLESTEROL TOTAL: 181 mg/dL (ref 100–199)
Chloride: 96 mmol/L (ref 96–106)
Chol/HDL Ratio: 5 ratio units (ref 0.0–5.0)
Creatinine, Ser: 0.82 mg/dL (ref 0.76–1.27)
EOS (ABSOLUTE): 0.3 10*3/uL (ref 0.0–0.4)
EOS: 3 %
ESTIMATED CHD RISK: 1 times avg. (ref 0.0–1.0)
FREE THYROXINE INDEX: 2.1 (ref 1.2–4.9)
GFR calc Af Amer: 121 mL/min/{1.73_m2} (ref >59)
GFR calc non Af Amer: 105 mL/min/{1.73_m2} (ref >59)
GGT: 25 IU/L (ref 0–65)
GLOBULIN, TOTAL: 3 g/dL (ref 1.5–4.5)
Glucose: 264 mg/dL — ABNORMAL HIGH (ref 65–99)
HDL: 36 mg/dL — AB (ref >39)
HEMATOCRIT: 42.1 % (ref 37.5–51.0)
HEMOGLOBIN: 13.8 g/dL (ref 12.6–17.7)
IMMATURE GRANS (ABS): 0.1 10*3/uL (ref 0.0–0.1)
IMMATURE GRANULOCYTES: 1 %
Iron: 45 ug/dL (ref 38–169)
LDH: 156 IU/L (ref 121–224)
LDL CALC: 101 mg/dL — AB (ref 0–99)
LYMPHS ABS: 2.5 10*3/uL (ref 0.7–3.1)
Lymphs: 23 %
MCH: 28 pg (ref 26.6–33.0)
MCHC: 32.8 g/dL (ref 31.5–35.7)
MCV: 85 fL (ref 79–97)
Monocytes Absolute: 0.8 10*3/uL (ref 0.1–0.9)
Monocytes: 7 %
NEUTROS PCT: 66 %
Neutrophils Absolute: 7.4 10*3/uL — ABNORMAL HIGH (ref 1.4–7.0)
POTASSIUM: 4.2 mmol/L (ref 3.5–5.2)
Phosphorus: 4.1 mg/dL (ref 2.5–4.5)
Platelets: 315 10*3/uL (ref 150–379)
RBC: 4.93 x10E6/uL (ref 4.14–5.80)
RDW: 14.7 % (ref 12.3–15.4)
SODIUM: 138 mmol/L (ref 134–144)
T3 Uptake Ratio: 27 % (ref 24–39)
T4, Total: 7.8 ug/dL (ref 4.5–12.0)
TRIGLYCERIDES: 222 mg/dL — AB (ref 0–149)
TSH: 1.85 u[IU]/mL (ref 0.450–4.500)
Total Protein: 6.8 g/dL (ref 6.0–8.5)
Uric Acid: 5.2 mg/dL (ref 3.7–8.6)
VLDL Cholesterol Cal: 44 mg/dL — ABNORMAL HIGH (ref 5–40)
WBC: 11.1 10*3/uL — ABNORMAL HIGH (ref 3.4–10.8)

## 2016-04-19 LAB — HGB A1C W/O EAG: Hgb A1c MFr Bld: 10.1 % — ABNORMAL HIGH (ref 4.8–5.6)

## 2016-05-11 ENCOUNTER — Other Ambulatory Visit: Payer: Self-pay | Admitting: Emergency Medicine

## 2016-05-11 DIAGNOSIS — R309 Painful micturition, unspecified: Secondary | ICD-10-CM

## 2016-05-11 MED ORDER — FLUTICASONE PROPIONATE 50 MCG/ACT NA SUSP: 1.0000 | Freq: Two times a day (BID) | NASAL | 12 refills | Status: AC

## 2016-05-11 NOTE — Telephone Encounter (Signed)
Patient called and requested a refill for his Flonase.

## 2016-05-11 NOTE — Telephone Encounter (Signed)
Med refill approved 

## 2016-07-20 ENCOUNTER — Other Ambulatory Visit: Payer: Self-pay

## 2016-07-20 DIAGNOSIS — Z299 Encounter for prophylactic measures, unspecified: Secondary | ICD-10-CM

## 2016-07-20 NOTE — Progress Notes (Signed)
Patient came in to have blood drawn for testing for his upcoming appointment with Dr. Lady Gary, Dr. Tedd Sias and Dr Mills Koller at [facility].

## 2016-07-21 LAB — CMP12+LP+TP+TSH+6AC+PSA+CBC…
ALBUMIN: 3.9 g/dL (ref 3.5–5.5)
ALK PHOS: 100 IU/L (ref 39–117)
ALT: 18 IU/L (ref 0–44)
AST: 9 IU/L (ref 0–40)
Albumin/Globulin Ratio: 1.4 (ref 1.2–2.2)
BASOS ABS: 0 10*3/uL (ref 0.0–0.2)
BILIRUBIN TOTAL: 0.2 mg/dL (ref 0.0–1.2)
BUN / CREAT RATIO: 26 — AB (ref 9–20)
BUN: 19 mg/dL (ref 6–24)
Basos: 0 %
CHLORIDE: 99 mmol/L (ref 96–106)
CHOLESTEROL TOTAL: 180 mg/dL (ref 100–199)
Calcium: 8.9 mg/dL (ref 8.7–10.2)
Chol/HDL Ratio: 5 ratio units (ref 0.0–5.0)
Creatinine, Ser: 0.73 mg/dL — ABNORMAL LOW (ref 0.76–1.27)
EOS (ABSOLUTE): 0.4 10*3/uL (ref 0.0–0.4)
EOS: 3 %
ESTIMATED CHD RISK: 1 times avg. (ref 0.0–1.0)
FREE THYROXINE INDEX: 1.9 (ref 1.2–4.9)
GFR calc non Af Amer: 110 mL/min/{1.73_m2} (ref >59)
GFR, EST AFRICAN AMERICAN: 127 mL/min/{1.73_m2} (ref >59)
GGT: 19 IU/L (ref 0–65)
GLUCOSE: 169 mg/dL — AB (ref 65–99)
Globulin, Total: 2.7 g/dL (ref 1.5–4.5)
HDL: 36 mg/dL — ABNORMAL LOW (ref >39)
HEMOGLOBIN: 13.4 g/dL (ref 13.0–17.7)
Hematocrit: 40.5 % (ref 37.5–51.0)
IMMATURE GRANS (ABS): 0.1 10*3/uL (ref 0.0–0.1)
IMMATURE GRANULOCYTES: 1 %
Iron: 45 ug/dL (ref 38–169)
LDH: 143 IU/L (ref 121–224)
LDL CALC: 113 mg/dL — AB (ref 0–99)
LYMPHS: 20 %
Lymphocytes Absolute: 2.5 10*3/uL (ref 0.7–3.1)
MCH: 27.5 pg (ref 26.6–33.0)
MCHC: 33.1 g/dL (ref 31.5–35.7)
MCV: 83 fL (ref 79–97)
MONOCYTES: 7 %
Monocytes Absolute: 0.8 10*3/uL (ref 0.1–0.9)
NEUTROS PCT: 69 %
Neutrophils Absolute: 8.9 10*3/uL — ABNORMAL HIGH (ref 1.4–7.0)
PLATELETS: 295 10*3/uL (ref 150–379)
PROSTATE SPECIFIC AG, SERUM: 0.3 ng/mL (ref 0.0–4.0)
Phosphorus: 3.7 mg/dL (ref 2.5–4.5)
Potassium: 4.2 mmol/L (ref 3.5–5.2)
RBC: 4.87 x10E6/uL (ref 4.14–5.80)
RDW: 15.2 % (ref 12.3–15.4)
Sodium: 139 mmol/L (ref 134–144)
T3 Uptake Ratio: 27 % (ref 24–39)
T4, Total: 7 ug/dL (ref 4.5–12.0)
TSH: 1.55 u[IU]/mL (ref 0.450–4.500)
Total Protein: 6.6 g/dL (ref 6.0–8.5)
Triglycerides: 154 mg/dL — ABNORMAL HIGH (ref 0–149)
URIC ACID: 4.3 mg/dL (ref 3.7–8.6)
VLDL CHOLESTEROL CAL: 31 mg/dL (ref 5–40)
WBC: 12.6 10*3/uL — AB (ref 3.4–10.8)

## 2016-07-21 LAB — MICROALBUMIN / CREATININE URINE RATIO
CREATININE, UR: 261.8 mg/dL
MICROALBUM., U, RANDOM: 29.3 ug/mL
Microalb/Creat Ratio: 11.2 mg/g creat (ref 0.0–30.0)

## 2016-07-21 LAB — HGB A1C W/O EAG: Hgb A1c MFr Bld: 9.8 % — ABNORMAL HIGH (ref 4.8–5.6)

## 2016-07-21 LAB — VITAMIN D 25 HYDROXY (VIT D DEFICIENCY, FRACTURES): Vit D, 25-Hydroxy: 30.7 ng/mL (ref 30.0–100.0)

## 2016-07-23 ENCOUNTER — Observation Stay
Admission: EM | Admit: 2016-07-23 | Discharge: 2016-07-24 | Disposition: A | Discharge status: Home / Self Care | Payer: Managed Care, Other (non HMO) | Attending: Internal Medicine | Admitting: Internal Medicine

## 2016-07-23 ENCOUNTER — Emergency Department: Payer: Managed Care, Other (non HMO)

## 2016-07-23 DIAGNOSIS — Z79899 Other long term (current) drug therapy: Secondary | ICD-10-CM | POA: Insufficient documentation

## 2016-07-23 DIAGNOSIS — I420 Dilated cardiomyopathy: Secondary | ICD-10-CM | POA: Insufficient documentation

## 2016-07-23 DIAGNOSIS — Z7982 Long term (current) use of aspirin: Secondary | ICD-10-CM | POA: Insufficient documentation

## 2016-07-23 DIAGNOSIS — Z8249 Family history of ischemic heart disease and other diseases of the circulatory system: Secondary | ICD-10-CM | POA: Insufficient documentation

## 2016-07-23 DIAGNOSIS — Z6841 Body Mass Index (BMI) 40.0 and over, adult: Secondary | ICD-10-CM | POA: Diagnosis not present

## 2016-07-23 DIAGNOSIS — I472 Ventricular tachycardia, unspecified: Secondary | ICD-10-CM

## 2016-07-23 DIAGNOSIS — I110 Hypertensive heart disease with heart failure: Secondary | ICD-10-CM | POA: Diagnosis not present

## 2016-07-23 DIAGNOSIS — F329 Major depressive disorder, single episode, unspecified: Secondary | ICD-10-CM | POA: Insufficient documentation

## 2016-07-23 DIAGNOSIS — I5022 Chronic systolic (congestive) heart failure: Secondary | ICD-10-CM | POA: Diagnosis not present

## 2016-07-23 DIAGNOSIS — J449 Chronic obstructive pulmonary disease, unspecified: Secondary | ICD-10-CM | POA: Diagnosis not present

## 2016-07-23 DIAGNOSIS — E119 Type 2 diabetes mellitus without complications: Secondary | ICD-10-CM | POA: Insufficient documentation

## 2016-07-23 DIAGNOSIS — Z4502 Encounter for adjustment and management of automatic implantable cardiac defibrillator: Secondary | ICD-10-CM

## 2016-07-23 DIAGNOSIS — Z7951 Long term (current) use of inhaled steroids: Secondary | ICD-10-CM | POA: Insufficient documentation

## 2016-07-23 DIAGNOSIS — Z95810 Presence of automatic (implantable) cardiac defibrillator: Secondary | ICD-10-CM | POA: Diagnosis not present

## 2016-07-23 DIAGNOSIS — Z794 Long term (current) use of insulin: Secondary | ICD-10-CM | POA: Insufficient documentation

## 2016-07-23 DIAGNOSIS — R079 Chest pain, unspecified: Secondary | ICD-10-CM | POA: Diagnosis present

## 2016-07-23 LAB — BASIC METABOLIC PANEL
Anion gap: 9 (ref 5–15)
BUN: 19 mg/dL (ref 6–20)
CALCIUM: 8.9 mg/dL (ref 8.9–10.3)
CO2: 27 mmol/L (ref 22–32)
CREATININE: 0.84 mg/dL (ref 0.61–1.24)
Chloride: 99 mmol/L — ABNORMAL LOW (ref 101–111)
GFR calc Af Amer: >60 mL/min (ref >60)
GFR calc non Af Amer: >60 mL/min (ref >60)
Glucose, Bld: 175 mg/dL — ABNORMAL HIGH (ref 65–99)
Potassium: 3.9 mmol/L (ref 3.5–5.1)
Sodium: 135 mmol/L (ref 135–145)

## 2016-07-23 LAB — CBC
HCT: 38.8 % — ABNORMAL LOW (ref 40.0–52.0)
Hemoglobin: 13.1 g/dL (ref 13.0–18.0)
MCH: 27.5 pg (ref 26.0–34.0)
MCHC: 33.8 g/dL (ref 32.0–36.0)
MCV: 81.4 fL (ref 80.0–100.0)
Platelets: 314 10*3/uL (ref 150–440)
RBC: 4.77 MIL/uL (ref 4.40–5.90)
RDW: 15.3 % — ABNORMAL HIGH (ref 11.5–14.5)
WBC: 17.8 10*3/uL — ABNORMAL HIGH (ref 3.8–10.6)

## 2016-07-23 LAB — TROPONIN I
TROPONIN I: 0.03 ng/mL — AB (ref <0.03)
Troponin I: 0.03 ng/mL (ref <0.03)

## 2016-07-23 LAB — PHOSPHORUS: Phosphorus: 3.5 mg/dL (ref 2.5–4.6)

## 2016-07-23 LAB — BRAIN NATRIURETIC PEPTIDE: B Natriuretic Peptide: 49 pg/mL (ref 0.0–100.0)

## 2016-07-23 LAB — MAGNESIUM: Magnesium: 1.5 mg/dL — ABNORMAL LOW (ref 1.7–2.4)

## 2016-07-23 LAB — GLUCOSE, CAPILLARY: GLUCOSE-CAPILLARY: 164 mg/dL — AB (ref 65–99)

## 2016-07-23 LAB — TSH: TSH: 2.083 u[IU]/mL (ref 0.350–4.500)

## 2016-07-23 MED ORDER — SODIUM CHLORIDE 0.9% FLUSH
3.0000 mL | Freq: Two times a day (BID) | INTRAVENOUS | Status: DC
  Administered 2016-07-24: 3 mL via INTRAVENOUS

## 2016-07-23 MED ORDER — ONDANSETRON HCL 4 MG PO TABS: 4.0000 mg | ORAL_TABLET | Freq: Four times a day (QID) | ORAL | Status: DC | PRN

## 2016-07-23 MED ORDER — ONDANSETRON HCL 4 MG/2ML IJ SOLN: 4.0000 mg | Freq: Four times a day (QID) | INTRAMUSCULAR | Status: DC | PRN

## 2016-07-23 MED ORDER — CARVEDILOL 6.25 MG PO TABS
12.5000 mg | ORAL_TABLET | ORAL | Status: AC
  Administered 2016-07-23: 12.5 mg via ORAL
  Filled 2016-07-23: qty 2

## 2016-07-23 MED ORDER — ACETAMINOPHEN 650 MG RE SUPP: 650.0000 mg | Freq: Four times a day (QID) | RECTAL | Status: DC | PRN

## 2016-07-23 MED ORDER — MAGNESIUM SULFATE 2 GM/50ML IV SOLN
INTRAVENOUS | Status: AC
  Administered 2016-07-23: 2 g via INTRAVENOUS
  Filled 2016-07-23: qty 50

## 2016-07-23 MED ORDER — SODIUM CHLORIDE 0.9% FLUSH: 3.0000 mL | INTRAVENOUS | Status: DC | PRN

## 2016-07-23 MED ORDER — INSULIN ASPART 100 UNIT/ML ~~LOC~~ SOLN
0.0000 [IU] | Freq: Every day | SUBCUTANEOUS | Status: DC
Start: 2016-07-23 — End: 2016-07-24

## 2016-07-23 MED ORDER — MAGNESIUM SULFATE 2 GM/50ML IV SOLN
2.0000 g | Freq: Once | INTRAVENOUS | Status: AC
Start: 2016-07-23 — End: 2016-07-23
  Administered 2016-07-23: 2 g via INTRAVENOUS

## 2016-07-23 MED ORDER — INSULIN ASPART 100 UNIT/ML ~~LOC~~ SOLN
0.0000 [IU] | Freq: Three times a day (TID) | SUBCUTANEOUS | Status: DC
  Administered 2016-07-24: 5 [IU] via SUBCUTANEOUS
  Administered 2016-07-24: 3 [IU] via SUBCUTANEOUS
  Filled 2016-07-23: qty 5
  Filled 2016-07-23: qty 3

## 2016-07-23 MED ORDER — SODIUM CHLORIDE 0.9 % IV SOLN: 250.0000 mL | INTRAVENOUS | Status: DC | PRN

## 2016-07-23 MED ORDER — ACETAMINOPHEN 325 MG PO TABS: 650.0000 mg | ORAL_TABLET | Freq: Four times a day (QID) | ORAL | Status: DC | PRN

## 2016-07-23 MED ORDER — MAGNESIUM OXIDE 400 (241.3 MG) MG PO TABS: 400.0000 mg | ORAL_TABLET | ORAL | Status: DC | PRN

## 2016-07-23 MED ORDER — ENOXAPARIN SODIUM 40 MG/0.4ML ~~LOC~~ SOLN
40.0000 mg | Freq: Two times a day (BID) | SUBCUTANEOUS | Status: DC
  Administered 2016-07-23 – 2016-07-24 (×2): 40 mg via SUBCUTANEOUS
  Filled 2016-07-23 (×2): qty 0.4

## 2016-07-23 MED ORDER — POLYETHYLENE GLYCOL 3350 17 G PO PACK: 17.0000 g | PACK | Freq: Every day | ORAL | Status: DC | PRN

## 2016-07-23 MED ORDER — SODIUM CHLORIDE 0.9% FLUSH
3.0000 mL | Freq: Two times a day (BID) | INTRAVENOUS | Status: DC
  Administered 2016-07-23: 3 mL via INTRAVENOUS
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
## 2016-07-23 NOTE — ED Notes (Signed)
[employer] called to inform us of interrogation report on patient's ICD.  Patient shock delivered at 15:54 for 17 seconds for ventricular rate of 273.  15 min prior to shock delivered patient had 1 second long v-tach.  MD notified and information to be faxed to hospital.

## 2016-07-23 NOTE — ED Notes (Signed)
ED Provider at bedside. 

## 2016-07-23 NOTE — ED Triage Notes (Signed)
Pt arrives to ER via POV c/o "not feeling right" and had his defibrillator "fire" at approx 1530. Pt denies pain but doesn't feel normal. Pt alert and oriented X4, active, cooperative, pt in NAD. RR even and unlabored, color WNL.  Pt ambulatory and joking in triage.

## 2016-07-23 NOTE — ED Notes (Signed)
Admitting MD at bedside.

## 2016-07-23 NOTE — H&P (Signed)
[employer] [facility] [place] [facility]   PATIENT NAME: Daniel [name]    MR#:  [id number]  DATE OF BIRTH:  [birth year]  DATE OF ADMISSION:  07/23/2016  PRIMARY CARE PHYSICIAN: [facility]   REQUESTING/REFERRING PHYSICIAN: Dr. Fanny Bien  CHIEF COMPLAINT:   Chief Complaint  Patient presents with  . Chest Pain    HISTORY OF PRESENT ILLNESS:  Daniel [name]  is a 51 y.o. male with a known history of Nonischemic cardiomyopathy with ejection fraction of 35%, AICD, hypertension, diabetes, asthma presents to the emergency room after he felt his AICD fire. Patient was feeling funny and dizzy prior to this episode. On interrogation of his AICD he had 17 second run of V. tach at rate of 273 and was shot. He also had 2 episodes of non-sustained ventricular tachycardia prior to this. Diet well prior to this episode. He had one episode of vomiting yesterday due to reflux after he had a large meat loaf. No shortness of breath or chest pain. No syncope. No recent change in medications. He saw his cardiologist Dr. Lady Gary on 07/20/2016. Everything checked out well.  PAST MEDICAL HISTORY:   Past Medical History:  Diagnosis Date  . Asthma   . Cardiomyopathy (HCC) 2015  . CHF (congestive heart failure) (HCC)   . Diabetes mellitus without complication (HCC)   . Hypertension     PAST SURGICAL HISTORY:   Past Surgical History:  Procedure Laterality Date  . CARDIAC CATHETERIZATION    . IMPLANTABLE CARDIOVERTER DEFIBRILLATOR (ICD) GENERATOR CHANGE Left 07/08/2015   Procedure: ICD GENERATOR CHANGE/INITIAL IMPLANT;  Surgeon: Sharion Settler, MD;  Location: [facility] ORS;  Service: Cardiovascular;  Laterality: Left;    SOCIAL HISTORY:   Social History  Substance Use Topics  . Smoking status: Never Smoker  . Smokeless tobacco: Not on file  . Alcohol use No    FAMILY HISTORY:   Family History  Problem Relation Age of Onset  . CAD Father      DRUG ALLERGIES:   Allergies  Allergen  Reactions  . Eggs Or Egg-Derived Products Anaphylaxis  . Ceftriaxone Hives    [place], no respiratory symptoms.  . Influenza Vaccines     Other reaction(s): Other (See Comments)  . Simvastatin Other (See Comments)    Other reaction(s): Muscle Pain Joint pain.  Marland Kitchen Penicillins Rash    REVIEW OF SYSTEMS:   Review of Systems  Constitutional: Positive for malaise/fatigue. Negative for chills, fever and weight loss.  HENT: Negative for hearing loss and nosebleeds.   Eyes: Negative for blurred vision, double vision and pain.  Respiratory: Negative for cough, hemoptysis, sputum production, shortness of breath and wheezing.   Cardiovascular: Negative for chest pain, palpitations, orthopnea and leg swelling.  Gastrointestinal: Negative for abdominal pain, constipation, diarrhea, nausea and vomiting.  Genitourinary: Negative for dysuria and hematuria.  Musculoskeletal: Negative for back pain, falls and myalgias.  Skin: Negative for rash.  Neurological: Positive for dizziness. Negative for tremors, sensory change, speech change, focal weakness, seizures and headaches.  Endo/Heme/Allergies: Does not bruise/bleed easily.  Psychiatric/Behavioral: Negative for depression and memory loss. The patient is not nervous/anxious.     MEDICATIONS AT HOME:   Prior to Admission medications   Medication Sig Start Date End Date Taking? Authorizing Provider  albuterol (PROVENTIL HFA;VENTOLIN HFA) 108 (90 Base) MCG/ACT inhaler Inhale 1-2 puffs into the lungs every 6 (six) hours as needed for wheezing or shortness of breath. 12/05/15   Tommie Maximiano Coss, NP  Albuterol Sulfate  108 (90 Base) MCG/ACT AEPB Inhale 1 puff into the lungs 4 (four) times daily as needed.    Historical Provider, MD  aspirin EC 81 MG tablet Take 81 mg by mouth every morning.    Historical Provider, MD  carvedilol (COREG) 6.25 MG tablet Take 6.25 mg by mouth 2 (two) times daily with a meal.    Historical Provider, MD   chlorpheniramine-HYDROcodone (TUSSIONEX PENNKINETIC ER) 10-8 MG/5ML SUER Take 5 mLs by mouth every 12 (twelve) hours as needed for cough. 12/05/15   Tommie Maximiano Coss, NP  fluticasone (FLONASE) 50 MCG/ACT nasal spray Place 1 spray into both nostrils 2 (two) times daily. 05/11/16   Faythe Ghee, PA-C  Fluticasone-Salmeterol (ADVAIR) 250-50 MCG/DOSE AEPB Inhale 2 puffs into the lungs daily as needed. 08/24/15   Historical Provider, MD  furosemide (LASIX) 20 MG tablet Take 20 mg by mouth daily. Pt can take up to an addition 20 mg if needed for weight gain of 3-5 lbs.    Historical Provider, MD  glipiZIDE (GLUCOTROL XL) 10 MG 24 hr tablet Take 10 mg by mouth 2 (two) times daily.    Historical Provider, MD  glucose blood (ONE TOUCH ULTRA TEST) test strip Apply 1 strip topically 4 (four) times daily as needed. 06/03/15   Historical Provider, MD  insulin lispro (HUMALOG) 100 UNIT/ML injection Inject 6 Units into the skin 2 (two) times daily. Sliding scale 6-16 units at lunch and dinner.    Historical Provider, MD  Insulin Pen Needle (PEN NEEDLES 31GX5/16") 31G X 8 MM MISC Inject 1 Units into the skin 4 (four) times daily as needed. 07/02/14   Historical Provider, MD  levofloxacin (LEVAQUIN) 500 MG tablet Take 1 tablet (500 mg total) by mouth daily. 12/05/15   Tommie Maximiano Coss, NP  lisinopril (PRINIVIL,ZESTRIL) 5 MG tablet Take 5 mg by mouth daily.    Historical Provider, MD  magnesium aspartate (MAGINEX) 615 MG tablet Take 615 mg by mouth daily.    Historical Provider, MD  MAGNESIUM PO Take 1 tablet by mouth every morning.    Historical Provider, MD  metFORMIN (GLUMETZA) 500 MG (MOD) 24 hr tablet Take 1,000 mg by mouth 2 (two) times daily.    Historical Provider, MD  methylPREDNISolone (MEDROL DOSEPAK) 4 MG TBPK tablet X 6 days 12/05/15   Francesco Sor, NP  montelukast (SINGULAIR) 10 MG tablet Take 10 mg by mouth every morning.    Historical Provider, MD  phenazopyridine (PYRIDIUM) 200 MG tablet Take 1  tablet (200 mg total) by mouth 3 (three) times daily. 09/27/15   Barbaraann Barthel, NP  sertraline (ZOLOFT) 50 MG tablet Take 50 mg by mouth at bedtime.    Historical Provider, MD  sodium chloride (OCEAN) 0.65 % SOLN nasal spray Place 2 sprays into both nostrils every 2 (two) hours as needed for congestion. 09/27/15 10/28/15  Barbaraann Barthel, NP  spironolactone (ALDACTONE) 25 MG tablet Take 25 mg by mouth every morning.    Historical Provider, MD     VITAL SIGNS:  Blood pressure 117/77, pulse (!) 105, temperature 98.1 F (36.7 C), temperature source Oral, resp. rate 19, height 5\' 7"  (1.702 m), weight (!) 138.3 kg (305 lb), SpO2 95 %.  PHYSICAL EXAMINATION:  Physical Exam  GENERAL:  51 y.o.-year-old patient lying in the bed with no acute distress. Obese. EYES: Pupils equal, round, reactive to light and accommodation. No scleral icterus. Extraocular muscles intact.  HEENT: Head atraumatic, normocephalic. Oropharynx and nasopharynx clear. No oropharyngeal  erythema, moist oral mucosa  NECK:  Supple, no jugular venous distention. No thyroid enlargement, no tenderness.  LUNGS: Normal breath sounds bilaterally, no wheezing, rales, rhonchi. No use of accessory muscles of respiration.  CARDIOVASCULAR: S1, S2 normal. No murmurs, rubs, or gallops.  ABDOMEN: Soft, nontender, nondistended. Bowel sounds present. No organomegaly or mass.  EXTREMITIES: No pedal edema, cyanosis, or clubbing. + 2 pedal & radial pulses b/l.   NEUROLOGIC: Cranial nerves II through XII are intact. No focal Motor or sensory deficits appreciated b/l PSYCHIATRIC: The patient is alert and oriented x 3. Good affect.  SKIN: No obvious rash, lesion, or ulcer.   LABORATORY PANEL:   CBC  Recent Labs Lab 07/23/16 1746  WBC 17.8*  HGB 13.1  HCT 38.8*  PLT 314   ------------------------------------------------------------------------------------------------------------------  Chemistries   Recent Labs Lab 07/20/16 0851  07/23/16 1746  NA 139 135  K 4.2 3.9  CL 99 99*  CO2  --  27  GLUCOSE 169* 175*  BUN 19 19  CREATININE 0.73* 0.84  CALCIUM 8.9 8.9  MG  --  1.5*  AST 9  --   ALT 18  --   ALKPHOS 100  --   BILITOT 0.2  --    ------------------------------------------------------------------------------------------------------------------  Cardiac Enzymes  Recent Labs Lab 07/23/16 1746  TROPONINI 0.03*   ------------------------------------------------------------------------------------------------------------------  RADIOLOGY:  Dg Chest Portable 1 View  Result Date: 07/23/2016 CLINICAL DATA:  Pacemaker went off, history of hypertension EXAM: PORTABLE CHEST 1 VIEW COMPARISON:  07/08/2016 FINDINGS: AP erect portable view chest demonstrates a left-sided single lead ICD device, grossly similar in appearance and position. No acute infiltrate or effusion. Stable cardiomediastinal silhouette. No edema. No pneumothorax. IMPRESSION: No radiographic evidence for acute cardiopulmonary abnormality Electronically Signed   By: Jasmine Pang M.D.   On: 07/23/2016 18:24     IMPRESSION AND PLAN:   * Ventricular tachycardia defibrillated by patient's AICD Also 2 other nonsustained ventricular tachycardia episodes. Will increase dose of Coreg as recommended by cardiology. Admit patient under telemetry floor for overnight observation. Check TSH. Replace magnesium.  * Chronic systolic CHF is stable.  * Hypertension Continue home medications  * Diabetes mellitus Home medications and sliding scale insulin  * DVT prophylaxis with Lovenox  All the records are reviewed and case discussed with ED provider. Management plans discussed with the patient, family and they are in agreement.  CODE STATUS: FULL CODE  TOTAL TIME TAKING CARE OF THIS PATIENT: 40 minutes.   Milagros Loll R M.D on 07/23/2016 at 7:22 PM  Between 7am to 6pm - Pager - [phone]  After 6pm go to [email or web address] - password [email or web address]  [facility]  [facility] [place] [facility]  Office  [phone]  CC: Primary care physician; [facility]  Note: This dictation was prepared with Dragon dictation along with smaller phrase technology. Any transcriptional errors that result from this process are unintentional.

## 2016-07-23 NOTE — Progress Notes (Signed)
Anticoagulation monitoring(Lovenox):  51 yo male ordered Lovenox 40 mg Q12h  Filed Weights   07/23/16 1741  Weight: (!) 305 lb (138.3 kg)   BMI 47.9    Lab Results  Component Value Date   CREATININE 0.84 07/23/2016   CREATININE 0.73 (L) 07/20/2016   CREATININE 0.82 04/18/2016   Estimated Creatinine Clearance: 144.5 mL/min (by C-G formula based on SCr of 0.84 mg/dL). Hemoglobin & Hematocrit     Component Value Date/Time   HGB 13.1 07/23/2016 1746   HGB 13.4 06/09/2014 0425   HCT 38.8 (L) 07/23/2016 1746   HCT 40.5 07/20/2016 0851     Per Protocol for Patient with estCrcl > 30 ml/min and BMI > 40, will transition to Lovenox 40 mg Q12h.

## 2016-07-24 LAB — GLUCOSE, CAPILLARY
GLUCOSE-CAPILLARY: 180 mg/dL — AB (ref 65–99)
GLUCOSE-CAPILLARY: 225 mg/dL — AB (ref 65–99)

## 2016-07-24 LAB — MAGNESIUM: MAGNESIUM: 2 mg/dL (ref 1.7–2.4)

## 2016-07-24 LAB — TROPONIN I: Troponin I: 0.03 ng/mL (ref <0.03)

## 2016-07-24 MED ORDER — CARVEDILOL 12.5 MG PO TABS: 12.5000 mg | ORAL_TABLET | Freq: Two times a day (BID) | ORAL | 0 refills | Status: AC

## 2016-07-24 NOTE — Discharge Summary (Signed)
Daniel [name], is a 51 y.o. male  DOB [birth year]  MRN [id number].  Admission date:  07/23/2016  Admitting Physician  Milagros Loll, MD  Discharge Date:  07/24/2016   Primary MD  [facility]  Recommendations for primary care physician for things to follow:  Follow up with DR.Fath /Dr.Callwood in 2 weeks   Admission Diagnosis  Hypomagnesemia [E83.42] Ventricular tachycardia (HCC) [I47.2] Defibrillator discharge [Z45.02]   Discharge Diagnosis  Hypomagnesemia [E83.42] Ventricular tachycardia (HCC) [I47.2] Defibrillator discharge [Z45.02]   Active Problems:   Ventricular tachyarrhythmia Daniel [name])      Past Medical History:  Diagnosis Date  . Asthma   . Cardiomyopathy (HCC) 2015  . CHF (congestive heart failure) (HCC)   . Diabetes mellitus without complication (HCC)   . Hypertension     Past Surgical History:  Procedure Laterality Date  . CARDIAC CATHETERIZATION    . IMPLANTABLE CARDIOVERTER DEFIBRILLATOR (ICD) GENERATOR CHANGE Left 07/08/2015   Procedure: ICD GENERATOR CHANGE/INITIAL IMPLANT;  Surgeon: Sharion Settler, MD;  Location: [facility] ORS;  Service: Cardiovascular;  Laterality: Left;       History of present illness and  Hospital Course:     Kindly see H&P for history of present illness and admission details, please review complete Labs, Consult reports and Test reports for all details in brief  HPI  from the history and physical done on the day of admission  Daniel [name]  is a 51 y.o. male with a known history of Nonischemic cardiomyopathy with ejection fraction of 35%, AICD, hypertension, diabetes, asthma presents to the emergency room after he felt his AICD fire. Patient was feeling funny and dizzy prior to this episode. On interrogation of his AICD he had 17 second run of V. tach at rate of 273 and was  shot. He also had 2 episodes of non-sustained ventricular tachycardia prior to this.  Hospital Course  -51year-old obese white male with known history of nonischemic cardiomyopathy patient status post AICD placement December 2016 at [facility] by Dr. Maisie Fus. Patient recently had significant AICD discharge. Patient had AICD interrogated. Showed sustained ventricular tachycardia rate of 270. Patient states this is his first AICD discharge he's had.  Seen by cardiology,increased dose of coreg to 12.5 mg BID ,and discharged home  And advised to continue Magnesium supplements.  2.htn;.Continue home medications  *3. Diabetes mellitus Home medications and sliding scale insulin  4.chronic systolic chf;;stablecontinue coreg,lisinopril,aldactone.  Discharge Condition:    Follow UP  Follow-up Information    [facility] Follow up in 1 week(s).   Specialty:  Family Medicine Contact information: [address] [place] [address] [phone]        Alwyn Pea, MD Follow up in 2 week(s).   Specialties:  Cardiology, Internal Medicine Contact information: [address] [name] - CARDIOLOGY [place] [address] [phone]             Discharge Instructions  and  Discharge Medications   stable   Allergies as of 07/24/2016      Reactions   Eggs Or Egg-derived Products Anaphylaxis   Ceftriaxone Hives   Whelphs, no respiratory symptoms.   Influenza Vaccines    Other reaction(s): Other (See Comments)   Simvastatin Other (See Comments)   Other reaction(s): Muscle Pain Joint pain.   Penicillins Rash      Medication List    TAKE these medications   albuterol 108 (90 Base) MCG/ACT inhaler Commonly known as:  PROVENTIL HFA;VENTOLIN  HFA Inhale 1-2 puffs into the lungs every 6 (six) hours as needed for wheezing or shortness of breath.   aspirin EC 81 MG tablet Take 81 mg by mouth every morning.   carvedilol 12.5 MG tablet Commonly known as:   COREG Take 1 tablet (12.5 mg total) by mouth 2 (two) times daily with a meal. What changed:  medication strength  how much to take   cholecalciferol 1000 units tablet Commonly known as:  VITAMIN D Take 1,000 Units by mouth daily.   fluticasone 50 MCG/ACT nasal spray Commonly known as:  FLONASE Place 1 spray into both nostrils 2 (two) times daily.   Fluticasone-Salmeterol 250-50 MCG/DOSE Aepb Commonly known as:  ADVAIR Inhale 2 puffs into the lungs daily.   furosemide 40 MG tablet Commonly known as:  LASIX Take 40 mg by mouth daily.   insulin lispro 100 UNIT/ML injection Commonly known as:  HUMALOG Inject 16 Units into the skin 2 (two) times daily. Sliding scale 6-16 units at lunch and dinner.   lisinopril 5 MG tablet Commonly known as:  PRINIVIL,ZESTRIL Take 5 mg by mouth daily.   MAGNESIUM PO Take 250 mg by mouth every morning.   metFORMIN 500 MG (MOD) 24 hr tablet Commonly known as:  GLUMETZA Take 1,000 mg by mouth 2 (two) times daily.   PEN NEEDLES 31GX5/16" 31G X 8 MM Misc Inject 1 Units into the skin 4 (four) times daily as needed.   sertraline 50 MG tablet Commonly known as:  ZOLOFT Take 50 mg by mouth at bedtime.   SINGULAIR 10 MG tablet Generic drug:  montelukast Take 10 mg by mouth every morning.   SOLIQUA 100-33 UNT-MCG/ML Sopn Generic drug:  Insulin Glargine-Lixisenatide Inject 60 Units into the skin every morning.   spironolactone 25 MG tablet Commonly known as:  ALDACTONE Take 25 mg by mouth every morning.         Diet and Activity recommendation: See Discharge Instructions above   Consults obtained -cardio   Major procedures and Radiology Reports - PLEASE review detailed and final reports for all details, in brief -     Dg Chest Portable 1 View  Result Date: 07/23/2016 CLINICAL DATA:  Pacemaker went off, history of hypertension EXAM: PORTABLE CHEST 1 VIEW COMPARISON:  07/08/2016 FINDINGS: AP erect portable view chest  demonstrates a left-sided single lead ICD device, grossly similar in appearance and position. No acute infiltrate or effusion. Stable cardiomediastinal silhouette. No edema. No pneumothorax. IMPRESSION: No radiographic evidence for acute cardiopulmonary abnormality Electronically Signed   By: Jasmine Pang M.D.   On: 07/23/2016 18:24    Micro Results   No results found for this or any previous visit (from the past 240 hour(s)).     Today   Subjective:   Leelan Rajewski today has no headache,no chest abdominal pain,no new weakness tingling or numbness, feels much better wants to go home today.  Objective:   Blood pressure 124/81, pulse (!) 104, temperature 98.2 F (36.8 C), temperature source Oral, resp. rate 20, height 5\' 7"  (1.702 m), weight (!) 137.7 kg (303 lb 9.6 oz), SpO2 97 %.   Intake/Output Summary (Last 24 hours) at 07/24/16 2223 Last data filed at 07/24/16 0900  Gross per 24 hour  Intake              240 ml  Output                0 ml  Net  240 ml    Exam Awake Alert, Oriented x 3, No new F.N deficits, Normal affect [place].AT,PERRAL Supple Neck,No JVD, No cervical lymphadenopathy appriciated.  Symmetrical Chest wall movement, Good air movement bilaterally, CTAB RRR,No Gallops,Rubs or new Murmurs, No Parasternal Heave +ve B.Sounds, Abd Soft, Non tender, No organomegaly appriciated, No rebound -guarding or rigidity. No Cyanosis, Clubbing or edema, No new Rash or bruise  Data Review   CBC w Diff: Lab Results  Component Value Date   WBC 17.8 (H) 07/23/2016   HGB 13.1 07/23/2016   HGB 13.4 06/09/2014   HCT 38.8 (L) 07/23/2016   HCT 40.5 07/20/2016   PLT 314 07/23/2016   PLT 295 07/20/2016   LYMPHOPCT 16 06/21/2015   LYMPHOPCT 12.8 06/08/2014   MONOPCT 6 06/21/2015   MONOPCT 4.3 06/08/2014   EOSPCT 5 06/21/2015   EOSPCT 0.0 06/08/2014   BASOPCT 1 06/21/2015   BASOPCT 0.1 06/08/2014    CMP: Lab Results  Component Value Date   NA 135 07/23/2016    NA 139 07/20/2016   NA 139 06/09/2014   K 3.9 07/23/2016   K 4.0 06/09/2014   CL 99 (L) 07/23/2016   CL 106 06/09/2014   CO2 27 07/23/2016   CO2 27 06/09/2014   BUN 19 07/23/2016   BUN 19 07/20/2016   BUN 22 (H) 06/09/2014   CREATININE 0.84 07/23/2016   CREATININE 1.01 06/09/2014   PROT 6.6 07/20/2016   PROT 7.0 06/07/2014   ALBUMIN 3.9 07/20/2016   ALBUMIN 3.2 (L) 06/07/2014   BILITOT 0.2 07/20/2016   BILITOT 0.5 06/07/2014   ALKPHOS 100 07/20/2016   ALKPHOS 87 06/07/2014   AST 9 07/20/2016   AST 14 (L) 06/07/2014   ALT 18 07/20/2016   ALT 26 06/07/2014  .   Total Time in preparing paper work, data evaluation and todays exam - 35 minutes  Quanna Wittke M.D on 07/24/2016 at 10:23 PM    Note: This dictation was prepared with Dragon dictation along with smaller phrase technology. Any transcriptional errors that result from this process are unintentional.

## 2016-07-24 NOTE — Progress Notes (Signed)
IV and tele removed from patient. Discharge instructions given to patient. Patient verbalized understanding. No further questions at this time. Pharmacy location of prescription reviewed with patient. Wife is at bedside and will be transporting patient home.

## 2016-07-24 NOTE — Progress Notes (Signed)
Pt arrived from ED AxO x4. No co/o pain no SOB. Telemetry box verified, no skin issues verified with Location manager . Wife at bedside, VS stable. No concerns offered at this time.

## 2016-07-24 NOTE — Progress Notes (Signed)
Patient ambulated around nurses station. Reports no chest pain and no  Arrhythmias were observed on the telemetry monitor. Patient's heart rate did increase to 120's.

## 2016-07-24 NOTE — Care Management (Signed)
Daniel [name] is doing just fine, he said that he is looking forward to being discharged, but did not need or desire anything right now.

## 2016-07-24 NOTE — Consult Note (Signed)
Reason for Consult: Ventricular tachycardia AICD discharge Referring Physician: Dr. Darvin Neighbours hospitalist Cardiologist Dr. Hoyt Koch is an 51 y.o. male.  HPI: 51 year old obese white male with known history of nonischemic cardiomyopathy patient status post AICD placement December 2016 at [facility] by Dr. Marcello Moores. Patient recently had significant AICD discharge. Patient had AICD interrogated. Showed sustained ventricular tachycardia rate of 270. Patient states this is his first AICD discharge he's had. Patient feels reasonably well now  Past Medical History:  Diagnosis Date  . Asthma   . Cardiomyopathy ([place]) 2015  . CHF (congestive heart failure) ([place])   . Diabetes mellitus without complication ([place])   . Hypertension     Past Surgical History:  Procedure Laterality Date  . CARDIAC CATHETERIZATION    . IMPLANTABLE CARDIOVERTER DEFIBRILLATOR (ICD) GENERATOR CHANGE Left 07/08/2015   Procedure: ICD GENERATOR CHANGE/INITIAL IMPLANT;  Surgeon: Marzetta Board, MD;  Location: [facility] ORS;  Service: Cardiovascular;  Laterality: Left;    Family History  Problem Relation Age of Onset  . CAD Father     Social History:  reports that he has never smoked. He has never used smokeless tobacco. He reports that he does not drink alcohol or use drugs.  Allergies:  Allergies  Allergen Reactions  . Eggs Or Egg-Derived Products Anaphylaxis  . Ceftriaxone Hives    Whelphs, no respiratory symptoms.  . Influenza Vaccines     Other reaction(s): Other (See Comments)  . Simvastatin Other (See Comments)    Other reaction(s): Muscle Pain Joint pain.  Marland Kitchen Penicillins Rash    Medications: I have reviewed the patient's current medications.  Results for orders placed or performed during the hospital encounter of 07/23/16 (from the past 48 hour(s))  Basic metabolic panel     Status: Abnormal   Collection Time: 07/23/16  5:46 PM  Result Value Ref Range   Sodium 135 135 - 145 mmol/L   Potassium 3.9 3.5 - 5.1  mmol/L   Chloride 99 (L) 101 - 111 mmol/L   CO2 27 22 - 32 mmol/L   Glucose, Bld 175 (H) 65 - 99 mg/dL   BUN 19 6 - 20 mg/dL   Creatinine, Ser 0.84 0.61 - 1.24 mg/dL   Calcium 8.9 8.9 - 10.3 mg/dL   GFR calc non Af Amer >60 >60 mL/min   GFR calc Af Amer >60 >60 mL/min    Comment: (NOTE) The eGFR has been calculated using the CKD EPI equation. This calculation has not been validated in all clinical situations. eGFR's persistently <60 mL/min signify possible Chronic Kidney Disease.    Anion gap 9 5 - 15  CBC     Status: Abnormal   Collection Time: 07/23/16  5:46 PM  Result Value Ref Range   WBC 17.8 (H) 3.8 - 10.6 K/uL   RBC 4.77 4.40 - 5.90 MIL/uL   Hemoglobin 13.1 13.0 - 18.0 g/dL   HCT 38.8 (L) 40.0 - 52.0 %   MCV 81.4 80.0 - 100.0 fL   MCH 27.5 26.0 - 34.0 pg   MCHC 33.8 32.0 - 36.0 g/dL   RDW 15.3 (H) 11.5 - 14.5 %   Platelets 314 150 - 440 K/uL  Troponin I     Status: Abnormal   Collection Time: 07/23/16  5:46 PM  Result Value Ref Range   Troponin I 0.03 (HH) <0.03 ng/mL    Comment: CRITICAL RESULT CALLED TO, READ BACK BY AND VERIFIED WITH KENDALL MOFFETT RN AT 5366 07/23/16 MSS.   Magnesium  Status: Abnormal   Collection Time: 07/23/16  5:46 PM  Result Value Ref Range   Magnesium 1.5 (L) 1.7 - 2.4 mg/dL  Phosphorus     Status: None   Collection Time: 07/23/16  5:46 PM  Result Value Ref Range   Phosphorus 3.5 2.5 - 4.6 mg/dL  Brain natriuretic peptide     Status: None   Collection Time: 07/23/16  5:46 PM  Result Value Ref Range   B Natriuretic Peptide 49.0 0.0 - 100.0 pg/mL  TSH     Status: None   Collection Time: 07/23/16  5:46 PM  Result Value Ref Range   TSH 2.083 0.350 - 4.500 uIU/mL    Comment: Performed by a 3rd Generation assay with a functional sensitivity of <=0.01 uIU/mL.  Glucose, capillary     Status: Abnormal   Collection Time: 07/23/16  8:50 PM  Result Value Ref Range   Glucose-Capillary 164 (H) 65 - 99 mg/dL  Troponin I     Status: Abnormal    Collection Time: 07/23/16 10:16 PM  Result Value Ref Range   Troponin I 0.03 (HH) <0.03 ng/mL    Comment: CRITICAL VALUE NOTED. VALUE IS CONSISTENT WITH PREVIOUSLY REPORTED/CALLED VALUE.MSS  Troponin I     Status: Abnormal   Collection Time: 07/24/16  4:04 AM  Result Value Ref Range   Troponin I 0.03 (HH) <0.03 ng/mL    Comment: CRITICAL VALUE NOTED. VALUE IS CONSISTENT WITH PREVIOUSLY REPORTED/CALLED VALUE QSD   Magnesium     Status: None   Collection Time: 07/24/16  4:04 AM  Result Value Ref Range   Magnesium 2.0 1.7 - 2.4 mg/dL  Glucose, capillary     Status: Abnormal   Collection Time: 07/24/16  7:32 AM  Result Value Ref Range   Glucose-Capillary 180 (H) 65 - 99 mg/dL   Comment 1 Notify RN    Comment 2 Document in Chart   Glucose, capillary     Status: Abnormal   Collection Time: 07/24/16 11:14 AM  Result Value Ref Range   Glucose-Capillary 225 (H) 65 - 99 mg/dL   Comment 1 Notify RN    Comment 2 Document in Chart     Dg Chest Portable 1 View  Result Date: 07/23/2016 CLINICAL DATA:  Pacemaker went off, history of hypertension EXAM: PORTABLE CHEST 1 VIEW COMPARISON:  07/08/2016 FINDINGS: AP erect portable view chest demonstrates a left-sided single lead ICD device, grossly similar in appearance and position. No acute infiltrate or effusion. Stable cardiomediastinal silhouette. No edema. No pneumothorax. IMPRESSION: No radiographic evidence for acute cardiopulmonary abnormality Electronically Signed   By: Donavan Foil M.D.   On: 07/23/2016 18:24    Review of Systems  Constitutional: Positive for malaise/fatigue.  HENT: Negative.   Eyes: Negative.   Respiratory: Positive for shortness of breath.   Cardiovascular: Positive for palpitations and leg swelling.  Gastrointestinal: Negative.   Genitourinary: Negative.   Musculoskeletal: Negative.   Skin: Negative.   Neurological: Negative.   Endo/Heme/Allergies: Negative.   Psychiatric/Behavioral: Negative.    Blood  pressure 124/81, pulse (!) 104, temperature 98.2 F (36.8 C), temperature source Oral, resp. rate 20, height 5' 7"  (1.702 m), weight (!) 137.7 kg (303 lb 9.6 oz), SpO2 97 %. Physical Exam  Nursing note and vitals reviewed. Constitutional: He is oriented to person, place, and time. He appears well-developed and well-nourished.  HENT:  Head: Normocephalic and atraumatic.  Right Ear: External ear normal.  Eyes: Conjunctivae and EOM are normal. Pupils are equal, round,  and reactive to light.  Neck: Normal range of motion. Neck supple.  Cardiovascular: Normal rate, regular rhythm and normal heart sounds.   Respiratory: Effort normal and breath sounds normal.  GI: Soft. Bowel sounds are normal.  Musculoskeletal: Normal range of motion.  Neurological: He is alert and oriented to person, place, and time. He has normal reflexes.  Skin: Skin is warm and dry.  Psychiatric: He has a normal mood and affect.    Assessment/Plan: Ventricular tachycardia Cardiomyopathy Congestive heart failure AICD discharge Diabetes Hypertension Obesity Probable obstructive sleep apnea . PLAN Agree with telemetry Increase Coreg to 12.5 twice a day Continue inhalers for COPD asthma Recommend weight loss exercise portion control Continue diabetes management with glipizide and metformin Agree with Zoloft therapy for mild depression Continue lisinopril and spironolactone and Coreg DVT prophylaxis Increase activity by ambulating Recommend conservative medical therapy for now Follow-up with cardiology as an outpatient  Dwayne D Callwood 07/24/2016, 12:44 PM

## 2016-12-24 ENCOUNTER — Encounter: Payer: Self-pay | Admitting: Physician Assistant

## 2016-12-24 ENCOUNTER — Ambulatory Visit: Payer: Self-pay | Admitting: Physician Assistant

## 2016-12-24 VITALS — BP 117/58 | HR 107 | Temp 97.8°F

## 2016-12-24 DIAGNOSIS — J209 Acute bronchitis, unspecified: Secondary | ICD-10-CM

## 2016-12-24 MED ORDER — IPRATROPIUM-ALBUTEROL 0.5-2.5 (3) MG/3ML IN SOLN: 3.0000 mL | Freq: Once | RESPIRATORY_TRACT | Status: AC

## 2016-12-24 MED ORDER — METHYLPREDNISOLONE 4 MG PO TBPK: ORAL_TABLET | ORAL | 0 refills | Status: DC

## 2016-12-24 MED ORDER — IPRATROPIUM-ALBUTEROL 0.5-2.5 (3) MG/3ML IN SOLN: 3.0000 mL | RESPIRATORY_TRACT | 12 refills | Status: AC | PRN

## 2016-12-24 MED ORDER — LEVOFLOXACIN 500 MG PO TABS: 500.0000 mg | ORAL_TABLET | Freq: Every day | ORAL | 0 refills | Status: DC

## 2016-12-24 NOTE — Progress Notes (Signed)
S: C/o cough and congestion with wheezing and chest pain, chest is sore from coughing, ?fever, chills at night only; mucus is green and thick in the mornings, mostly cough is dry and hacking; keeping pt awake at night;  denies cardiac type chest pain or sob, v/d, abd pain Remainder ros neg  O: vitals wnl, nad, tms clear, throat injected, neck supple no lymph, lungs with wheezing and decreased air movement, cv rrr, neuro intact, svn duoneb given, pt has increased air movement and no wheezing afterwards  A:  Acute bronchitis   P:  rx medication:  levaquin 500mg  qd x 7d, medrol dose pack, duoneb neubles;  use otc meds, tylenol or motrin as needed for fever/chills, return if not better in 3 -5 days, return earlier if worsening

## 2017-01-04 IMAGING — DX DG CHEST 1V PORT
1 series · 1 of 1 positions shown · non-contrast
Comparison: 07/08/2016

CLINICAL DATA: Pacemaker went off, history of hypertension

EXAM:
PORTABLE CHEST 1 VIEW

[chest ap]
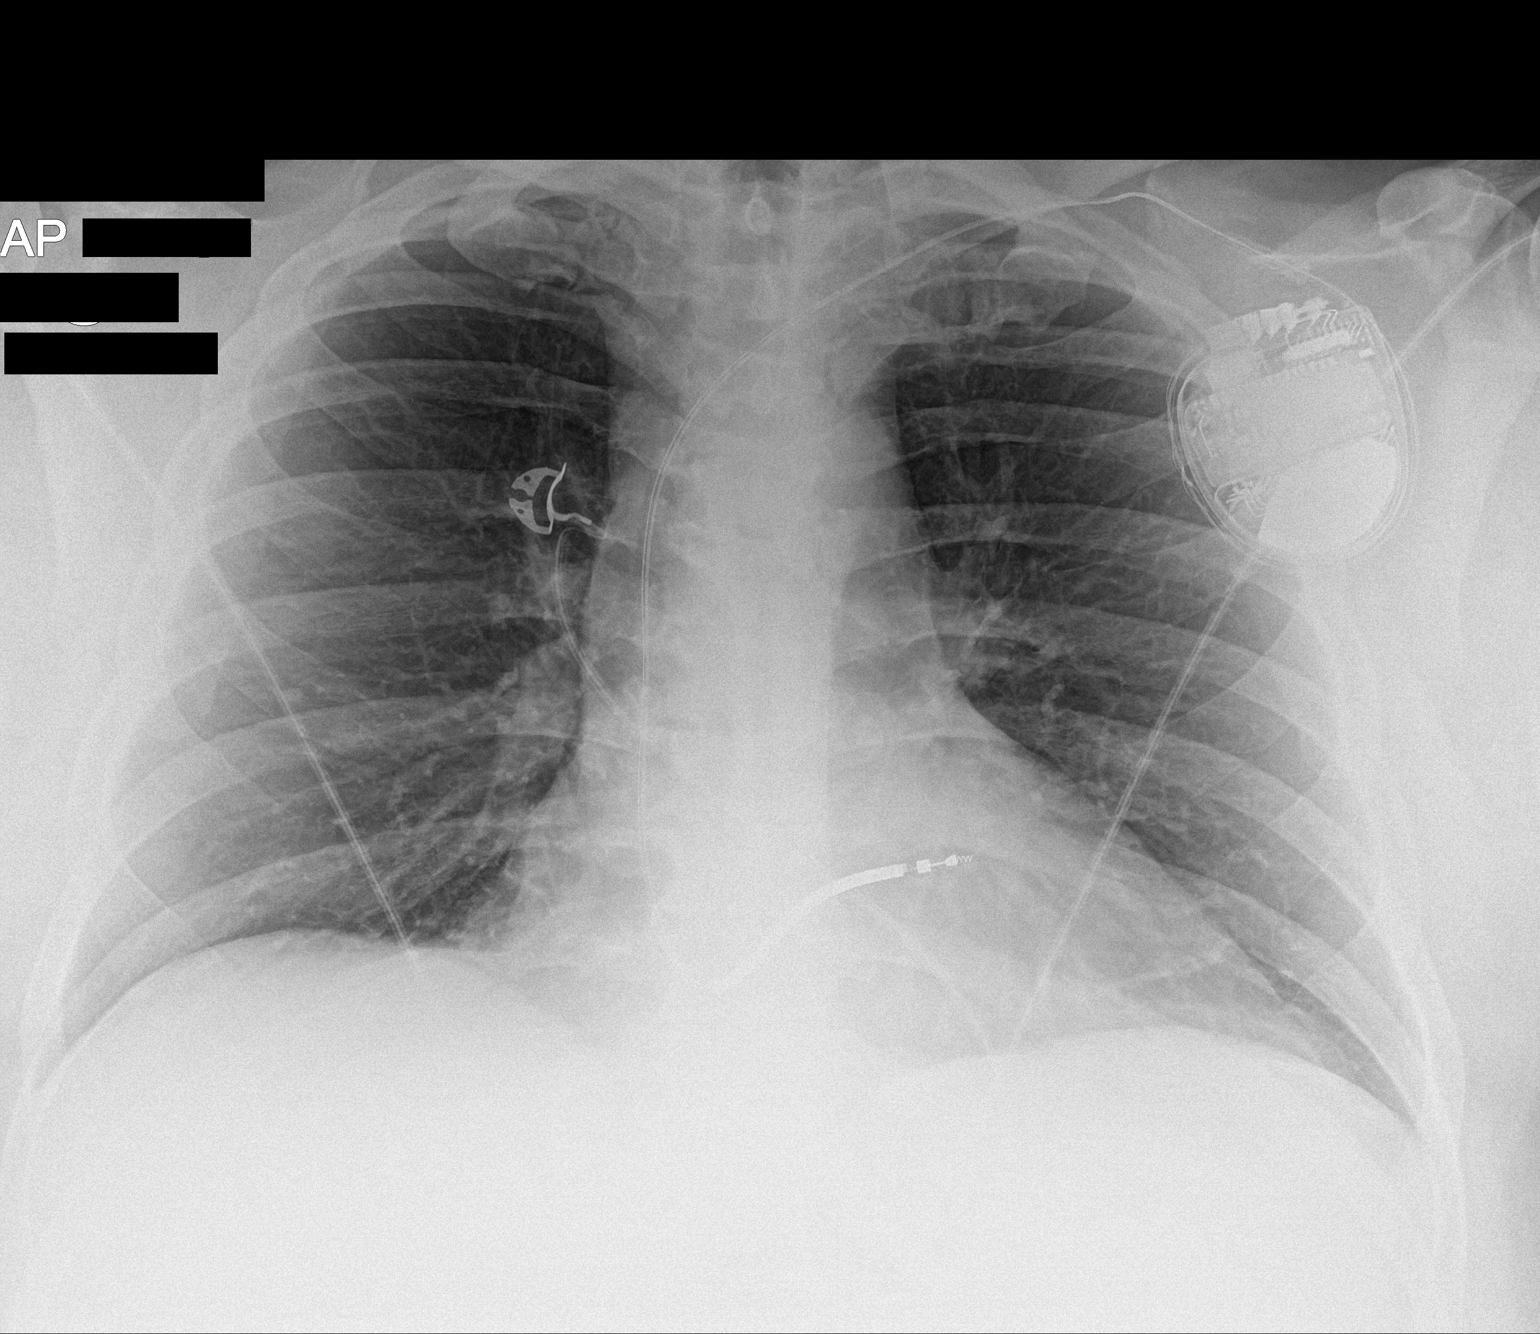

[1 of 1 positions shown; findings below may reference images not displayed]

FINDINGS: AP erect portable view chest demonstrates a left-sided single lead
ICD device, grossly similar in appearance and position. No acute
infiltrate or effusion. Stable cardiomediastinal silhouette. No
edema. No pneumothorax.
IMPRESSION: No radiographic evidence for acute cardiopulmonary abnormality

## 2023-01-11 ENCOUNTER — Encounter: Payer: Self-pay | Admitting: *Deleted

## 2023-01-11 ENCOUNTER — Other Ambulatory Visit: Payer: Self-pay | Admitting: *Deleted

## 2023-01-11 ENCOUNTER — Inpatient Hospital Stay
Admission: RE | Admit: 2023-01-11 | Discharge: 2023-01-11 | Disposition: A | Discharge status: Home / Self Care | Payer: Self-pay | Source: Ambulatory Visit | Attending: Oncology | Admitting: Oncology

## 2023-01-11 DIAGNOSIS — K6389 Other specified diseases of intestine: Secondary | ICD-10-CM

## 2023-01-11 NOTE — Progress Notes (Signed)
PATIENT NAVIGATOR PROGRESS NOTE  Name: LEKEITH WULF Date: 01/11/2023 MRN: [id number]  DOB: September 13, 1965   Reason for visit:  New pt appt  Comments:  Spoke with Mr Gascoigne and scheduled him with Dr Truett Perna on Monday June 24 at 1:40 for New Pt appt  Reviewed directions to building and parking and gave contact information    Time spent counseling/coordinating care: 30-45 minutes

## 2023-01-14 ENCOUNTER — Inpatient Hospital Stay: Payer: Medicare Other | Attending: Oncology | Admitting: Oncology

## 2023-01-14 ENCOUNTER — Encounter: Payer: Self-pay | Admitting: *Deleted

## 2023-01-14 ENCOUNTER — Other Ambulatory Visit: Payer: Self-pay | Admitting: *Deleted

## 2023-01-14 VITALS — BP 92/69 | HR 96 | Temp 98.1°F | Resp 18 | Ht 67.0 in | Wt 211.0 lb

## 2023-01-14 DIAGNOSIS — I10 Essential (primary) hypertension: Secondary | ICD-10-CM

## 2023-01-14 DIAGNOSIS — K769 Liver disease, unspecified: Secondary | ICD-10-CM

## 2023-01-14 DIAGNOSIS — D509 Iron deficiency anemia, unspecified: Secondary | ICD-10-CM

## 2023-01-14 DIAGNOSIS — E119 Type 2 diabetes mellitus without complications: Secondary | ICD-10-CM | POA: Diagnosis not present

## 2023-01-14 DIAGNOSIS — F1011 Alcohol abuse, in remission: Secondary | ICD-10-CM

## 2023-01-14 DIAGNOSIS — K6389 Other specified diseases of intestine: Secondary | ICD-10-CM

## 2023-01-14 NOTE — Progress Notes (Signed)
PATIENT NAVIGATOR PROGRESS NOTE  Name: Daniel [name] Date: 01/14/2023 MRN: [id number]  DOB: January 18, 1966   Reason for visit:  New patient appt  Comments:  Met with Mr and Mrs Nevares during appt with Dr Truett Perna  Images uploaded into EPIC from [facility] Dr Truett Perna ordered liver bx and Port placement as soon as possible to expedite tx start Referral placed to [facility] for colonoscopy Referral to SW Referral to nutrition Given Journey notebook with disease specific information Will return to clinic on 7/2 for pretreatment discussion Given contact information to call with any questions    Time spent counseling/coordinating care: > 60 minutes

## 2023-01-14 NOTE — Progress Notes (Signed)
[facility] New Patient Consult   Requesting MD: Artis Delay, Md [address] [place],  [place] [address]   Daniel [name] 57 y.o.  May 12, 1966    Reason for Consult: Colon mass, liver lesions   HPI: Daniel. [name] presented to his primary provider on 01/04/2023 with right lateral subcostal discomfort.  He reports the pain has resolved.  A laboratory evaluation was remarkable for hemoglobin of 10.8, MCV 73.4, WBC 15.5, and platelets 516,000.  The ferritin returned at 98.9.  A chemistry panel found the bilirubin at 0.3, AST  56, ALT 32, and alkaline phosphatase 334.Marland Kitchen  An abdominal ultrasound on 01/08/2023 revealed multifocal hyperechoic masses in the liver with hepatomegaly.  The largest lesion measured 8.7 cm in the right hepatic lobe.  No biliary ductal dilation.  He was referred for a CT abdomen/pelvis on 01/08/2023.  Liver is enlarged with a mildly nodular contour.  Multiple by lobar hypoattenuating lesions including a 9.5 x 9 cm hepatic segment 7/8 lesion.  Subcentimeter hypoattenuating pancreatic lesions measure 0.7 cm in the uncinate 8 and 0.6 cm in the pancreas tail.  No ductal dilation.  There is abnormal thickening of a segment of sigmoid colon with spiculated soft tissue extending into pericolonic fat.  Multiple adjacent prominent pericolonic lymph nodes.  No evidence of bowel obstruction.  He was referred to [facility] for colonoscopy and is scheduled for 02/07/2023.  Daniel [name] reports a greater than 100 pound intentional weight loss since starting Mounjaro.  Past Medical History:  Diagnosis Date   Asthma    Cardiomyopathy (HCC) 2015   CHF (congestive heart failure) (HCC)    Diabetes mellitus without complication (HCC)    Hypertension      .  Hypercholesterolemia  Past Surgical History:  Procedure Laterality Date   CARDIAC CATHETERIZATION     IMPLANTABLE CARDIOVERTER DEFIBRILLATOR (ICD) GENERATOR CHANGE Left 07/08/2015   Procedure: ICD GENERATOR  CHANGE/INITIAL IMPLANT;  Surgeon: Sharion Settler, MD;  Location: [facility] ORS;  Service: Cardiovascular;  Laterality: Left;    Medications: Reviewed  Allergies:  Allergies  Allergen Reactions   Egg-Derived Products Anaphylaxis   Ceftriaxone Hives    Whelphs, no respiratory symptoms.   Influenza Vaccines     Other reaction(s): Other (See Comments)   Simvastatin Other (See Comments)    Other reaction(s): Muscle Pain Joint pain.   Penicillins Rash    Family history: His mother had lung cancer.  A brother had multiple myeloma.  Social History:   He lives with his wife and nephew in [place].  He is retired Radiation protection practitioner.  He does not use cigarettes.  He has a remote history of heavy alcohol use.  He received Red cell transfusion at age 43 when he had GI bleeding.  No risk factor for HIV or hepatitis.  ROS:   Positives include: Intentional weight loss, decreased appetite since starting Mounjaro, loose stool and nausea while on Mounjaro, 2 episodes of rectal bleeding over the past 6 months, right subcostal pain 2 to 3 weeks ago-resolved  A complete ROS was otherwise negative.  Physical Exam:  Blood pressure 92/69, pulse 96, temperature 98.1 F (36.7 C), temperature source Oral, resp. rate 18, height 5\' 7"  (1.702 m), weight 211 lb (95.7 kg), SpO2 100 %.  HEENT: Oropharynx without visible mass, neck without mass Lungs: Clear bilaterally, no respiratory distress Cardiac: Regular rate and rhythm Abdomen: Nontender, no mass, no hepatosplenomegaly GU: Testes without mass Vascular: No leg edema Lymph nodes: No cervical, supraclavicular, axillary,  or inguinal nodes Neurologic: Alert and oriented, the motor exam appears intact in the upper and lower extremities bilaterally Skin: No rash Musculoskeletal: No spine tenderness   LAB:  CBC  Lab Results  Component Value Date   WBC 17.8 (H) 07/23/2016   HGB 13.1 07/23/2016   HCT 38.8 (L) 07/23/2016   MCV 81.4 07/23/2016   PLT 314  07/23/2016   NEUTROABS 8.9 (H) 07/20/2016        CMP  Lab Results  Component Value Date   NA 135 07/23/2016   K 3.9 07/23/2016   CL 99 (L) 07/23/2016   CO2 27 07/23/2016   GLUCOSE 175 (H) 07/23/2016   BUN 19 07/23/2016   CREATININE 0.84 07/23/2016   CALCIUM 8.9 07/23/2016   PROT 6.6 07/20/2016   ALBUMIN 3.9 07/20/2016   AST 9 07/20/2016   ALT 18 07/20/2016   ALKPHOS 100 07/20/2016   BILITOT 0.2 07/20/2016   GFRNONAA >60 07/23/2016   GFRAA >60 07/23/2016     Imaging:  As per HPI    Assessment/Plan:   Sigmoid colon mass, multiple liver lesions Ultrasound abdomen 01/08/2023-multifocal hyperechoic liver lesions, largest measuring 8.7 cm in the right hepatic lobe CT abdomen/pelvis 01/08/2023-multiple by lobar hypoattenuating hepatic lesions, masslike thickening of the sigmoid colon with abnormal spiculated pericolonic soft tissue.  IMA lymphadenopathy.  Subcentimeter pancreatic hypodensities without pancreatic duct dilation Diabetes Cardiomyopathy Hypertension Hypercholesterolemia Weight loss secondary to [facility], metastatic carcinoma? Microcytic anemia secondary to #1   Disposition:   Daniel [name] appears to have metastatic colon cancer.  He presented with right subcostal pain.  An ultrasound and CT revealed extensive liver metastases and a probable sigmoid colon mass.  The clinical presentation is consistent with a diagnosis of metastatic colon cancer. I discussed the prognosis and treatment options with Daniel [name].  He understands no therapy will be curative if a diagnosis of metastatic colon cancer is confirmed.  He will be referred for a diagnostic liver biopsy and Port-A-Cath placement.  He does not appear symptomatic from the colon mass.  We will refer him for a diagnostic sigmoidoscopy.  He understands there is no role for resecting the primary tumor unless he develops obstruction or increased bleeding.  Daniel. [name] will begin ferrous sulfate.  He will return for an office  visit and further discussion next week.  We had a preliminary discussion regarding treatment options for metastatic colon cancer.  He will most likely receive chemotherapy every 2 weeks.  We will add biologic therapy as indicated based on molecular testing.    Thornton Papas, MD  01/14/2023, 1:57 PM

## 2023-01-15 ENCOUNTER — Telehealth: Payer: Self-pay | Admitting: Oncology

## 2023-01-15 ENCOUNTER — Inpatient Hospital Stay: Payer: Medicare Other | Admitting: Licensed Clinical Social Worker

## 2023-01-15 NOTE — Progress Notes (Signed)
[facility] Clinical Social Work  Initial Assessment   EDUARDO HONOR is a 57 y.o. year old male contacted by phone. Clinical Social Work was referred by nurse navigator for assessment of psychosocial needs.   SDOH (Social Determinants of Health) assessments performed: Yes SDOH Interventions    Flowsheet Row Clinical Support from 01/15/2023 in [facility]  SDOH Interventions   Food Insecurity Interventions Intervention Not Indicated  Housing Interventions Intervention Not Indicated  Transportation Interventions Intervention Not Indicated  Utilities Interventions Intervention Not Indicated  Financial Strain Interventions Intervention Not Indicated  Stress Interventions Intervention Not Indicated       SDOH Screenings   Food Insecurity: No Food Insecurity (01/15/2023)  Housing: Low Risk  (01/15/2023)  Transportation Needs: No Transportation Needs (01/15/2023)  Utilities: Not At Risk (01/15/2023)  Financial Resource Strain: Low Risk  (01/15/2023)  Stress: No Stress Concern Present (01/15/2023)     Distress Screen completed: No     No data to display            Family/Social Information:  Housing Arrangement: Patient lives with his wife and nephew.   Family members/support persons in your life? Family.  His wife is a patient of Dr. Bertis Ruddy. Transportation concerns: no  Employment: Legally disabled Patient is a retired Radiation protection practitioner.  Income source: Secretary/administrator concerns: No Type of concern: None Food access concerns: no Religious or spiritual practice: No Services Currently in place:  Medicare  Coping/ Adjustment to diagnosis: Patient understands treatment plan and what happens next? Yes.  He is to have a liver biopsy and then the treatment plan will be determined. Concerns about diagnosis and/or treatment: I'm not especially worried about anything Patient reported stressors:  None Hopes and/or priorities: "Everything will  be fine" Patient enjoys time with family/ friends Current coping skills/ strengths: Capable of independent living , Communication skills , Financial means , General fund of knowledge , and Supportive family/friends     SUMMARY: Current SDOH Barriers:  Patient did not identify any.  Clinical Social Work Clinical Goal(s):  No clinical social work goals at this time  Interventions: Discussed common feeling and emotions when being diagnosed with cancer, and the importance of support during treatment Informed patient of the support team roles and support services at [facility] Provided CSW contact information and encouraged patient to call with any questions or concerns Provided patient with information about the [employer].  He agreed to have CSW mail him brochure, calendar and contact information.   Follow Up Plan: Patient will contact CSW with any support or resource needs Patient verbalizes understanding of plan: Yes    Dorothey Baseman, LCSW Clinical Social Worker [facility]

## 2023-01-15 NOTE — Telephone Encounter (Signed)
Patient is aware of upcoming appointment times/dates.  

## 2023-01-16 NOTE — Progress Notes (Signed)
Roanna Banning, MD  Leodis Rains D PROCEDURE / BIOPSY REVIEW Date: 01/16/23  Requested Biopsy site: Liver Reason for request: Mass Imaging review: Best seen on OSH CT AP on PACS, 01/08/23  Decision: Approved Imaging modality to perform: Ultrasound Schedule with: Moderate Sedation Schedule for: Any VIR  Additional comments: @VIR : multifocal liver masses @Schedulers . US Liver Mass Bx. AC hold, ASA 81. CBC and Coags on procedure day.  Please contact me with questions, concerns, or if issue pertaining to this request arise.  Roanna Banning, MD Vascular and Interventional Radiology Specialists [facility] Radiology

## 2023-01-17 ENCOUNTER — Encounter: Payer: Self-pay | Admitting: *Deleted

## 2023-01-17 MED ORDER — FERROUS SULFATE 325 (65 FE) MG PO TBEC: 325.0000 mg | DELAYED_RELEASE_TABLET | Freq: Every day | ORAL | 3 refills | Status: AC

## 2023-01-17 NOTE — H&P (Signed)
Chief Complaint: Patient was seen in consultation today for colon mass, liver lesions.   Referring Physician(s): Ladene Artist  Supervising Physician: Gilmer Mor  Patient Status: [facility] - Out-pt  History of Present Illness: Daniel [name] is a 57 y.o. male with a medical history significant for cardiomyopathy (ICD), CHF, DM and HTN. He presented to his PCP January 04, 2023 with complaints of right abdominal pain. Laboratory evaluation was remarkable for hemoglobin of 10.8, MCV 73.4, WBC 15.5, and platelets 516,000. The ferritin returned at 98.9. A chemistry panel found the bilirubin at 0.3, AST 56, ALT 32, and alkaline phosphatase 334. An abdominal ultrasound was performed and this revealed multifocal liver masses. A CT abdomen/pelvis (performed at [facility]) showed findings consistent with metastatic colon cancer.    Interventional Radiology has been asked to evaluate this patient for an image-guided liver lesion biopsy and image-guided port-a-catheter placement. Imaging reviewed and procedure approved by Dr. Milford Cage.    Past Medical History:  Diagnosis Date   Asthma    Cardiomyopathy (HCC) 2015   CHF (congestive heart failure) (HCC)    Diabetes mellitus without complication (HCC)    Hypertension     Past Surgical History:  Procedure Laterality Date   CARDIAC CATHETERIZATION     IMPLANTABLE CARDIOVERTER DEFIBRILLATOR (ICD) GENERATOR CHANGE Left 07/08/2015   Procedure: ICD GENERATOR CHANGE/INITIAL IMPLANT;  Surgeon: Sharion Settler, MD;  Location: [facility] ORS;  Service: Cardiovascular;  Laterality: Left;    Allergies: Egg-derived products, Ceftriaxone, Influenza vaccines, Simvastatin, and Penicillins  Medications: Prior to Admission medications   Medication Sig Start Date End Date Taking? Authorizing Provider  albuterol (PROVENTIL HFA;VENTOLIN HFA) 108 (90 Base) MCG/ACT inhaler Inhale 1-2 puffs into the lungs every 6 (six) hours as needed for wheezing or shortness of breath.  12/05/15   Francesco Sor, NP  ascorbic acid (VITAMIN C) 500 MG tablet Take 500 mg by mouth daily.    [provider]  aspirin EC 81 MG tablet Take 81 mg by mouth every morning.    [provider]  carvedilol (COREG) 12.5 MG tablet Take 1 tablet (12.5 mg total) by mouth 2 (two) times daily with a meal. 07/24/16   Katha Hamming, MD  cholecalciferol (VITAMIN D) 1000 units tablet Take 1,000 Units by mouth daily.    [provider]  ferrous sulfate 325 (65 FE) MG EC tablet Take 1 tablet (325 mg total) by mouth daily with breakfast. 01/17/23   Ladene Artist, MD  fluticasone (FLONASE) 50 MCG/ACT nasal spray Place 1 spray into both nostrils 2 (two) times daily. 05/11/16   Fisher, Roselyn Bering, PA-C  fluticasone furoate-vilanterol (BREO ELLIPTA) 200-25 MCG/ACT AEPB Inhale 1 puff into the lungs daily.    [provider]  furosemide (LASIX) 40 MG tablet Take 40 mg by mouth daily.     [provider]  Insulin Glargine (SEMGLEE [place]) Inject 14 Units into the skin at bedtime.    [provider]  Insulin Lispro (HUMALOG KWIKPEN [place]) Inject 8 Units into the skin in the morning and at bedtime.    [provider]  Insulin Pen Needle (PEN NEEDLES 31GX5/16") 31G X 8 MM MISC Inject 1 Units into the skin 4 (four) times daily as needed. 07/02/14   [provider]  ipratropium-albuterol (DUONEB) 0.5-2.5 (3) MG/3ML SOLN Take 3 mLs by nebulization every 4 (four) hours as needed. 12/24/16   Fisher, Roselyn Bering, PA-C  JARDIANCE 25 MG TABS tablet Take 1 tablet by mouth daily. 08/30/22  [provider]  lisinopril (PRINIVIL,ZESTRIL) 5 MG tablet Take 2.5 mg by mouth daily.    [provider]  MAGNESIUM PO Take 500 mg by mouth every morning.    [provider]  metFORMIN (GLUMETZA) 500 MG (MOD) 24 hr tablet Take 1,000 mg by mouth 2 (two) times daily.    [provider]  methocarbamol (ROBAXIN) 500 MG tablet Take 1 tablet by  mouth 3 (three) times daily. 03/26/19   [provider]  montelukast (SINGULAIR) 10 MG tablet Take 10 mg by mouth every morning.    [provider]  MOUNJARO 15 MG/0.5ML Pen Inject into the skin. 01/29/22   [provider]  rosuvastatin (CRESTOR) 5 MG tablet Take 1 tablet by mouth at bedtime. 03/07/20   [provider]  sertraline (ZOLOFT) 50 MG tablet Take 50 mg by mouth at bedtime.    [provider]  spironolactone (ALDACTONE) 25 MG tablet Take 25 mg by mouth every morning.    [provider]     Family History  Problem Relation Age of Onset   CAD Father     Social History   Socioeconomic History   Marital status: Married    Spouse name: Not on file   Number of children: Not on file   Years of education: Not on file   Highest education level: Not on file  Occupational History   Not on file  Tobacco Use   Smoking status: Never   Smokeless tobacco: Never  Substance and Sexual Activity   Alcohol use: No    Alcohol/week: 0.0 standard drinks of alcohol   Drug use: No   Sexual activity: Not on file  Other Topics Concern   Not on file  Social History Narrative   Not on file   Social Determinants of Health   Financial Resource Strain: Low Risk  (01/15/2023)   Overall Financial Resource Strain (CARDIA)    Difficulty of Paying Living Expenses: Not hard at all  Food Insecurity: No Food Insecurity (01/15/2023)   Hunger Vital Sign    Worried About Running Out of Food in the Last Year: Never true    Ran Out of Food in the Last Year: Never true  Transportation Needs: No Transportation Needs (01/15/2023)   PRAPARE - Administrator, Civil Service (Medical): No    Lack of Transportation (Non-Medical): No  Physical Activity: Not on file  Stress: No Stress Concern Present (01/15/2023)   [employer] of Occupational Health - Occupational Stress Questionnaire    Feeling of Stress : Not at all  Social Connections: Not on  file    Review of Systems: A 12 point ROS discussed and pertinent positives are indicated in the HPI above.  All other systems are negative.  Review of Systems  Constitutional:  Negative for appetite change and fatigue.  Respiratory:  Negative for cough and shortness of breath.   Cardiovascular:  Negative for chest pain and leg swelling.  Gastrointestinal:  Negative for abdominal pain, diarrhea, nausea and vomiting.  Musculoskeletal:  Negative for back pain.  Neurological:  Negative for dizziness and headaches.    Vital Signs: BP 104/69 (BP Location: Left Arm)   Pulse 82   Temp 98.4 F (36.9 C) (Oral)   Resp 16   Ht 5\' 7"  (1.702 m)   Wt 211 lb (95.7 kg)   SpO2 97%   BMI 33.05 kg/m   Physical Exam Constitutional:      General: He is not  in acute distress.    Appearance: He is not ill-appearing.  HENT:     Mouth/Throat:     Mouth: Mucous membranes are moist.     Pharynx: Oropharynx is clear.  Cardiovascular:     Rate and Rhythm: Normal rate and regular rhythm.     Pulses: Normal pulses.     Heart sounds: Normal heart sounds.  Pulmonary:     Effort: Pulmonary effort is normal.     Breath sounds: Normal breath sounds.  Abdominal:     General: Bowel sounds are normal.     Palpations: Abdomen is soft.     Tenderness: There is no abdominal tenderness.  Musculoskeletal:     Right lower leg: No edema.     Left lower leg: No edema.  Skin:    General: Skin is warm and dry.  Neurological:     Mental Status: He is alert and oriented to person, place, and time.  Psychiatric:        Mood and Affect: Mood normal.        Behavior: Behavior normal.        Thought Content: Thought content normal.        Judgment: Judgment normal.     Imaging: No results found.  Labs:  CBC: Recent Labs    01/22/23 0757  WBC 13.8*  HGB 10.9*  HCT 36.5*  PLT 445*    COAGS: Recent Labs    01/22/23 0757  INR 1.1    BMP: No results for input(s): "NA", "K", "CL", "CO2",  "GLUCOSE", "BUN", "CALCIUM", "CREATININE", "GFRNONAA", "GFRAA" in the last 8760 hours.  Invalid input(s): "CMP"  LIVER FUNCTION TESTS: No results for input(s): "BILITOT", "AST", "ALT", "ALKPHOS", "PROT", "ALBUMIN" in the last 8760 hours.  TUMOR MARKERS: No results for input(s): "AFPTM", "CEA", "CA199", "CHROMGRNA" in the last 8760 hours.  Assessment and Plan:  Suspected metastatic colon cancer; pending chemotherapy: Allean Found, 57 year old male, presents today to the [facility] Interventional Radiology department for an image-guided liver lesion biopsy and image-guided port-a-catheter placement.  Risks and benefits of liver lesion biopsy were discussed with the patient and/or patient's family including, but not limited to bleeding, infection, damage to adjacent structures or low yield requiring additional tests.  Risks and benefits of image-guided Port-a-catheter placement were discussed with the patient including, but not limited to bleeding, infection, pneumothorax, or fibrin sheath development and need for additional procedures.  All of the patient's questions were answered, patient is agreeable to proceed. He has been NPO. He is a full code. He does take a daily aspirin and this has been held for over one week.   Consent signed and in chart.  Thank you for this interesting consult.  I greatly enjoyed meeting KAOS HOYE and look forward to participating in their care.  A copy of this report was sent to the requesting provider on this date.  Electronically Signed: Alwyn Ren, AGACNP-BC [phone] 01/22/2023, 9:25 AM   I spent a total of  30 Minutes   in face to face in clinical consultation, greater than 50% of which was counseling/coordinating care for port placement and liver lesion biopsy.

## 2023-01-17 NOTE — Progress Notes (Signed)
PATIENT NAVIGATOR PROGRESS NOTE  Name: Daniel [name] Date: 01/17/2023 MRN: [id number]  DOB: 1966-02-14   Reason for visit  Telephone and coordination of care  Comments:   Spoke with [name] and informed him based on lab results, Dr Truett Perna recommends starting Ferrous Sulfate 325mg  daily with orange juice  Also coordinated with IR and he will have liver bx and Port placement on 01/22/23  Appt with Dr Truett Perna rescheduled to 01/25/23 at 0940 from 7/2    Time spent counseling/coordinating care: 45-60 minutes

## 2023-01-18 ENCOUNTER — Inpatient Hospital Stay: Payer: Medicare Other | Admitting: Dietician

## 2023-01-18 ENCOUNTER — Other Ambulatory Visit: Payer: Self-pay

## 2023-01-18 NOTE — Progress Notes (Signed)
Nutrition Assessment   Reason for Assessment: New patient    ASSESSMENT: 57 year old male with colonic mass concerning for metastatic colon cancer to liver. He is planning UGI (7/18) + liver biopsy (procedure schedule pending). Patient is under the care of Dr. Truett Perna  Past medical history includes cardiomyopathy, CHF, ICD in place, DM2, HTN, HLD  Met with patient in office. He reports mild fatigue, but otherwise "feeling great." Patient has a good appetite and eats 3 meals daily. He endorses intentional ~114 lb wt loss over the last 18 months with mounjaro. He discontinued this 2 weeks ago. Patient using CGM (Dexcom) for diabetes management. Patient states device has allowed him to learn what foods affect his sugar and the amount he can have. Patient states he has continued to enjoy his favorite foods, but in the correct portion size. He has sausage biscuit with a couple fries for breakfast. One slice pepperoni pizza for lunch. Pt had BBQ plate with slaw and single slice bread for supper. Patient drinks 1-2 caffeine free diet Mt. Dew as well as 80 ounces of water. He denies nausea, vomiting, diarrhea, constipation.   Nutrition Focused Physical Exam: deferred    Medications: vit D, ferrous sulfate, lasix 40 mg, insulin, linsinopril, metformin, robaxin, crestor, zoloft, crestor   Labs: BUN 22, glucose 153 10/10/22 HgbA1c - 5.9  Anthropometrics: Intentional wt loss secondary to changes in eating habits + mounjaro (started 01/23)  Height: 5'7" Weight: 211.6 lb (in office today) UBW: 300-305 lb (18 months ago per pt) BMI: 33.05    NUTRITION DIAGNOSIS: Food and nutrition related knowledge deficit related to colonic mass concerning for cancer as evidenced by no prior need for associated information   INTERVENTION:  Educated on importance of adequate calorie/protein energy intake to maintain strength/weights Recommend high protein snacks in between meals - handout provided  Discussed  strategies for blood glucose management - handout provided    MONITORING, EVALUATION, GOAL: Patient will tolerate adequate calories/protein to promote weight maintenance   Next Visit: To be scheduled with treatment plan as needed

## 2023-01-18 NOTE — Progress Notes (Signed)
Oley Balm, MD  Leodis Rains D PROCEDURE / BIOPSY REVIEW Date: 01/18/23  Requested Biopsy site: liver lesion Reason for request: mets Imaging review: Best seen on CT 01/08/23  Decision: Approved Imaging modality to perform: Ultrasound Schedule with: Moderate Sedation Schedule for: Any VIR  Additional comments:   Please contact me with questions, concerns, or if issue pertaining to this request arise.  Dayne Oley Balm, MD Vascular and Interventional Radiology Specialists [facility] Radiology

## 2023-01-21 ENCOUNTER — Other Ambulatory Visit: Payer: Self-pay | Admitting: Student

## 2023-01-21 ENCOUNTER — Other Ambulatory Visit: Payer: Self-pay | Admitting: Physician Assistant

## 2023-01-21 DIAGNOSIS — K769 Liver disease, unspecified: Secondary | ICD-10-CM

## 2023-01-22 ENCOUNTER — Telehealth: Payer: Self-pay | Admitting: Oncology

## 2023-01-22 ENCOUNTER — Inpatient Hospital Stay: Payer: Medicare Other | Admitting: Oncology

## 2023-01-22 ENCOUNTER — Other Ambulatory Visit: Payer: Self-pay | Admitting: Oncology

## 2023-01-22 ENCOUNTER — Ambulatory Visit (HOSPITAL_COMMUNITY)
Admission: RE | Admit: 2023-01-22 | Discharge: 2023-01-22 | Disposition: A | Discharge status: Home / Self Care | Payer: Medicare Other | Source: Ambulatory Visit | Attending: Oncology | Admitting: Oncology

## 2023-01-22 ENCOUNTER — Encounter (HOSPITAL_COMMUNITY): Payer: Self-pay

## 2023-01-22 DIAGNOSIS — Z794 Long term (current) use of insulin: Secondary | ICD-10-CM | POA: Insufficient documentation

## 2023-01-22 DIAGNOSIS — C787 Secondary malignant neoplasm of liver and intrahepatic bile duct: Secondary | ICD-10-CM | POA: Diagnosis not present

## 2023-01-22 DIAGNOSIS — K6389 Other specified diseases of intestine: Secondary | ICD-10-CM | POA: Diagnosis present

## 2023-01-22 DIAGNOSIS — I509 Heart failure, unspecified: Secondary | ICD-10-CM | POA: Diagnosis not present

## 2023-01-22 DIAGNOSIS — Z7984 Long term (current) use of oral hypoglycemic drugs: Secondary | ICD-10-CM | POA: Insufficient documentation

## 2023-01-22 DIAGNOSIS — I429 Cardiomyopathy, unspecified: Secondary | ICD-10-CM | POA: Diagnosis not present

## 2023-01-22 DIAGNOSIS — E119 Type 2 diabetes mellitus without complications: Secondary | ICD-10-CM | POA: Insufficient documentation

## 2023-01-22 DIAGNOSIS — K769 Liver disease, unspecified: Secondary | ICD-10-CM

## 2023-01-22 DIAGNOSIS — I110 Hypertensive heart disease with heart failure: Secondary | ICD-10-CM | POA: Diagnosis not present

## 2023-01-22 HISTORY — PX: IR US GUIDE BX ASP/DRAIN: IMG2392

## 2023-01-22 HISTORY — PX: IR IMAGING GUIDED PORT INSERTION: IMG5740

## 2023-01-22 LAB — GLUCOSE, CAPILLARY: Glucose-Capillary: 129 mg/dL — ABNORMAL HIGH (ref 70–99)

## 2023-01-22 LAB — CBC
HCT: 36.5 % — ABNORMAL LOW (ref 39.0–52.0)
Hemoglobin: 10.9 g/dL — ABNORMAL LOW (ref 13.0–17.0)
MCH: 22.9 pg — ABNORMAL LOW (ref 26.0–34.0)
MCHC: 29.9 g/dL — ABNORMAL LOW (ref 30.0–36.0)
MCV: 76.8 fL — ABNORMAL LOW (ref 80.0–100.0)
Platelets: 445 10*3/uL — ABNORMAL HIGH (ref 150–400)
RBC: 4.75 MIL/uL (ref 4.22–5.81)
RDW: 16.8 % — ABNORMAL HIGH (ref 11.5–15.5)
WBC: 13.8 10*3/uL — ABNORMAL HIGH (ref 4.0–10.5)
nRBC: 0 % (ref 0.0–0.2)

## 2023-01-22 LAB — PROTIME-INR
INR: 1.1 (ref 0.8–1.2)
Prothrombin Time: 14.1 seconds (ref 11.4–15.2)

## 2023-01-22 MED ORDER — SODIUM CHLORIDE 0.9 % IV SOLN: INTRAVENOUS | Status: DC

## 2023-01-22 MED ORDER — GELATIN ABSORBABLE 12-7 MM EX MISC
CUTANEOUS | Status: AC
  Filled 2023-01-22: qty 1

## 2023-01-22 MED ORDER — FENTANYL CITRATE (PF) 100 MCG/2ML IJ SOLN
INTRAMUSCULAR | Status: AC | PRN
  Administered 2023-01-22 (×2): 12.5 ug via INTRAVENOUS

## 2023-01-22 MED ORDER — LIDOCAINE-EPINEPHRINE 1 %-1:100000 IJ SOLN
INTRAMUSCULAR | Status: AC
  Filled 2023-01-22: qty 1

## 2023-01-22 MED ORDER — FENTANYL CITRATE (PF) 100 MCG/2ML IJ SOLN
INTRAMUSCULAR | Status: AC
  Filled 2023-01-22: qty 2

## 2023-01-22 MED ORDER — MIDAZOLAM HCL 2 MG/2ML IJ SOLN
INTRAMUSCULAR | Status: AC
  Filled 2023-01-22: qty 2

## 2023-01-22 MED ORDER — HEPARIN SOD (PORK) LOCK FLUSH 100 UNIT/ML IV SOLN
INTRAVENOUS | Status: AC
  Filled 2023-01-22: qty 5

## 2023-01-22 MED ORDER — HEPARIN SOD (PORK) LOCK FLUSH 100 UNIT/ML IV SOLN
500.0000 [IU] | Freq: Once | INTRAVENOUS | Status: AC
  Administered 2023-01-22: 500 [IU] via INTRAVENOUS

## 2023-01-22 MED ORDER — MIDAZOLAM HCL 2 MG/2ML IJ SOLN
INTRAMUSCULAR | Status: AC | PRN
  Administered 2023-01-22: 1 mg via INTRAVENOUS
  Administered 2023-01-22: .5 mg via INTRAVENOUS

## 2023-01-22 MED ORDER — LIDOCAINE-EPINEPHRINE 1 %-1:100000 IJ SOLN
20.0000 mL | INTRAMUSCULAR | Status: DC
  Filled 2023-01-22: qty 20

## 2023-01-22 NOTE — Procedures (Signed)
Interventional Radiology Procedure Note  Procedure: US guided biopsy of liver mass, right liver Complications: None EBL: None Recommendations: - Bedrest 2 hours.   - Routine wound care - Follow up pathology - Advance diet  - ok to restart all home meds  Signed,  Gilmer Mor, DO

## 2023-01-22 NOTE — Sedation Documentation (Signed)
Port placed, transitioning to liver biopsy.

## 2023-01-22 NOTE — Progress Notes (Signed)
Patient and wife was given discharge instructions. Both verbalized understanding. 

## 2023-01-22 NOTE — Procedures (Signed)
Interventional Radiology Procedure Note  Procedure: Placement of a right IJ approach single lumen PowerPort.  Tip is positioned at the superior cavoatrial junction and catheter is ready for immediate use.  Complications: None Recommendations:  - Ok to shower tomorrow - Do not submerge for 7 days - Routine line care  - proceed with liver biopsy  Signed,  Yvone Neu. Loreta Ave, DO

## 2023-01-25 ENCOUNTER — Inpatient Hospital Stay: Payer: Medicare Other | Attending: Oncology | Admitting: Oncology

## 2023-01-25 ENCOUNTER — Encounter: Payer: Self-pay | Admitting: *Deleted

## 2023-01-25 ENCOUNTER — Telehealth: Payer: Self-pay

## 2023-01-25 ENCOUNTER — Encounter: Payer: Self-pay | Admitting: Oncology

## 2023-01-25 ENCOUNTER — Inpatient Hospital Stay: Payer: Medicare Other

## 2023-01-25 VITALS — BP 92/69 | HR 98 | Temp 98.2°F | Resp 18 | Ht 67.0 in | Wt 208.4 lb

## 2023-01-25 DIAGNOSIS — C187 Malignant neoplasm of sigmoid colon: Secondary | ICD-10-CM | POA: Insufficient documentation

## 2023-01-25 DIAGNOSIS — K6389 Other specified diseases of intestine: Secondary | ICD-10-CM

## 2023-01-25 DIAGNOSIS — C189 Malignant neoplasm of colon, unspecified: Secondary | ICD-10-CM | POA: Insufficient documentation

## 2023-01-25 DIAGNOSIS — D509 Iron deficiency anemia, unspecified: Secondary | ICD-10-CM | POA: Insufficient documentation

## 2023-01-25 DIAGNOSIS — C787 Secondary malignant neoplasm of liver and intrahepatic bile duct: Secondary | ICD-10-CM | POA: Insufficient documentation

## 2023-01-25 DIAGNOSIS — Z5111 Encounter for antineoplastic chemotherapy: Secondary | ICD-10-CM | POA: Insufficient documentation

## 2023-01-25 MED ORDER — PROCHLORPERAZINE MALEATE 10 MG PO TABS: 10.0000 mg | ORAL_TABLET | Freq: Four times a day (QID) | ORAL | 0 refills | Status: DC | PRN

## 2023-01-25 MED ORDER — NA SULFATE-K SULFATE-MG SULF 17.5-3.13-1.6 GM/177ML PO SOLN: 1.0000 | Freq: Once | ORAL | 0 refills | Status: AC

## 2023-01-25 MED ORDER — ONDANSETRON HCL 8 MG PO TABS: 8.0000 mg | ORAL_TABLET | Freq: Three times a day (TID) | ORAL | 0 refills | Status: DC | PRN

## 2023-01-25 MED ORDER — LIDOCAINE-PRILOCAINE 2.5-2.5 % EX CREA: 1.0000 | TOPICAL_CREAM | CUTANEOUS | 0 refills | Status: DC | PRN

## 2023-01-25 NOTE — Progress Notes (Signed)
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

## 2023-01-25 NOTE — Telephone Encounter (Signed)
Message sent to pre-certification team since procedure is scheduled in 4 days.

## 2023-01-25 NOTE — Telephone Encounter (Signed)
Lm on vm for patient to return call 

## 2023-01-25 NOTE — Progress Notes (Signed)
PATIENT NAVIGATOR PROGRESS NOTE  Name: Daniel [name] Date: 01/25/2023 MRN: [id number]  DOB: 1966/03/26   Reason for visit:  Pre treatment education  Comments:  See education tab, FOLFOX Avastin therapy taught    Time spent counseling/coordinating care: > 60 minutes

## 2023-01-25 NOTE — Progress Notes (Signed)
The following biosimilar Zirabev (bevacizumab-bvzr) has been selected for use in this patient.  Pryor Ochoa, PharmD 01/25/23 @ 1200

## 2023-01-25 NOTE — Telephone Encounter (Signed)
MyChart message sent to patient as well.

## 2023-01-25 NOTE — Telephone Encounter (Signed)
Pt returned call. Pt has been scheduled for a direct colonoscopy in the [facility] on 01/29/23 at 8 am with Dr. Russella Dar. Patient has been advised that he will need a care partner to drive him to his appt and stay for the entire duration of the appt. Arrival time 7 am on the 4th floor. I provided patient with the office address. Patient is not on any anticoagulation therapy. Pt has been off of Mounjaro for about 2 weeks now. Patient still takes Jardiance, Metformin, Humalog, and Semglee. I have reviewed diet starting 5 days prior to procedure, pt knows to follow this moving forward. Clear liquid diet on 01/28/23, start half of prep at 6 pm, then he will finish second half of prep around 3 am. We reviewed medication instructions. Patient will take 1/2 dose of Semglee the evening before procedure, hold Humalog the evening before procedure, hold oral diabetic medications the day of the procedure. Patient confirmed that he has MyChart and knows that I will send his instructions there. Pt will let us know if he has any questions. Pt would like prep sent to [facility] on file. Patient verbalized understanding of all information and had no concerns at the end of the call.   Urgent ambulatory referral to GI in epic. SUPREP sent to pharmacy on file. Colonoscopy instructions sent to patient via MyChart.

## 2023-01-25 NOTE — Progress Notes (Signed)
[facility] OFFICE PROGRESS NOTE   Diagnosis: Colon cancer  INTERVAL HISTORY:   [name] returns as scheduled.  He underwent biopsy of a liver lesion and Port-A-Cath placement on 01/22/2023.  He reports tolerating the procedure well.  He feels well.  No complaint.  No difficulty with bowel function.  Good appetite.  Objective:  Vital signs in last 24 hours:  Blood pressure 92/69, pulse 98, temperature 98.2 F (36.8 C), temperature source Oral, resp. rate 18, height 5\' 7"  (1.702 m), weight 208 lb 6.4 oz (94.5 kg), SpO2 100 %.     Resp: Lungs clear bilaterally Cardio: Regular rate and rhythm GI: No hepatosplenomegaly, nontender Vascular: No leg edema   Portacath/PICC-without erythema  Lab Results:  Lab Results  Component Value Date   WBC 13.8 (H) 01/22/2023   HGB 10.9 (L) 01/22/2023   HCT 36.5 (L) 01/22/2023   MCV 76.8 (L) 01/22/2023   PLT 445 (H) 01/22/2023   NEUTROABS 8.9 (H) 07/20/2016    CMP  Lab Results  Component Value Date   NA 135 07/23/2016   K 3.9 07/23/2016   CL 99 (L) 07/23/2016   CO2 27 07/23/2016   GLUCOSE 175 (H) 07/23/2016   BUN 19 07/23/2016   CREATININE 0.84 07/23/2016   CALCIUM 8.9 07/23/2016   PROT 6.6 07/20/2016   ALBUMIN 3.9 07/20/2016   AST 9 07/20/2016   ALT 18 07/20/2016   ALKPHOS 100 07/20/2016   BILITOT 0.2 07/20/2016   GFRNONAA >60 07/23/2016   GFRAA >60 07/23/2016    No results found for: "CEA1", "CEA", "ZOX096", "CA125"  Lab Results  Component Value Date   INR 1.1 01/22/2023   LABPROT 14.1 01/22/2023    Imaging:  IR IMAGING GUIDED PORT INSERTION  Result Date: 01/22/2023 INDICATION: 57 year old male referred for port catheter and liver mass biopsy EXAM: IMPLANTED PORT A CATH PLACEMENT WITH ULTRASOUND AND FLUOROSCOPIC GUIDANCE IMAGE GUIDED BIOPSY OF LIVER MASS MEDICATIONS: None ANESTHESIA/SEDATION: Moderate (conscious) sedation was employed during this procedure. A total of Versed 1.5 mg and Fentanyl 37.5  mcg was administered intravenously by the radiology nurse. Total intra-service moderate Sedation Time: 38 minutes. The patient's level of consciousness and vital signs were monitored continuously by radiology nursing throughout the procedure under my direct supervision. FLUOROSCOPY: Radiation Exposure Index (as provided by the fluoroscopic device): 3 mGy Kerma COMPLICATIONS: None PROCEDURE: Informed written consent was obtained from the patient after a thorough discussion of the procedural risks, benefits and alternatives. All questions were addressed. Maximal Sterile Barrier Technique was utilized including caps, mask, sterile gowns, sterile gloves, sterile drape, hand hygiene and skin antiseptic. A timeout was performed prior to the initiation of the procedure. Ultrasound survey was performed with images stored and sent to PACs. Right IJ vein documented to be patent. The right neck and chest was prepped with chlorhexidine, and draped in the usual sterile fashion using maximum barrier technique (cap and mask, sterile gown, sterile gloves, large sterile sheet, hand hygiene and cutaneous antiseptic). Local anesthesia was attained by infiltration with 1% lidocaine without epinephrine. Ultrasound demonstrated patency of the right internal jugular vein, and this was documented with an image. Under real-time ultrasound guidance, this vein was accessed with a 21 gauge micropuncture needle and image documentation was performed. A small dermatotomy was made at the access site with an 11 scalpel. A 0.018" wire was advanced into the SVC and used to estimate the length of the internal catheter. The access needle exchanged for a 37F micropuncture vascular sheath.  The 0.018" wire was then removed and a 0.035" wire advanced into the IVC. An appropriate location for the subcutaneous reservoir was selected below the clavicle and an incision was made through the skin and underlying soft tissues. The subcutaneous tissues were then  dissected using a combination of blunt and sharp surgical technique and a pocket was formed. A single lumen power injectable portacatheter was then tunneled through the subcutaneous tissues from the pocket to the dermatotomy and the port reservoir placed within the subcutaneous pocket. The venous access site was then serially dilated and a peel away vascular sheath placed over the wire. The wire was removed and the port catheter advanced into position under fluoroscopic guidance. The catheter tip is positioned in the cavoatrial junction. This was documented with a spot image. The portacatheter was then tested and found to flush and aspirate well. The port was flushed with saline followed by 100 units/mL heparinized saline. The pocket was then closed in two layers using first subdermal inverted interrupted absorbable sutures followed by a running subcuticular suture. The epidermis was then sealed with Dermabond. The dermatotomy at the venous access site was also seal with Dermabond. We then proceeded with liver mass biopsy. Ultrasound survey of the right liver lobe performed with images stored and sent to PACs. The right lower thorax/right upper abdomen was prepped with chlorhexidine in a sterile fashion, and a sterile drape was applied covering the operative field. A sterile gown and sterile gloves were used for the procedure. Local anesthesia was provided with 1% Lidocaine. The patient was prepped and draped sterilely and the skin and subcutaneous tissues were generously infiltrated with 1% lidocaine. A 17 gauge introducer needle was then advanced under ultrasound guidance in an intercostal location into the right liver lobe, targeting heterogeneously echoic mass. The stylet was removed, and multiple separate 18 gauge core biopsy were retrieved. Samples were placed into formalin for transportation to the lab. Gel-Foam pledgets were then infused with a small amount of saline for assistance with hemostasis. The  needle was removed, and a final ultrasound image was performed. The patient tolerated the procedure well and remained hemodynamically stable throughout. No complications were encountered and no significant blood loss was encounter. IMPRESSION: Status post image guided right IJ port catheter placement and image guided right liver mass biopsy. Signed, Yvone Neu. Miachel Roux, RPVI [facility] Electronically Signed   By: Gilmer Mor D.O.   On: 01/22/2023 14:11   IR US Guide Bx Asp/Drain  Result Date: 01/22/2023 INDICATION: 57 year old male referred for port catheter and liver mass biopsy EXAM: IMPLANTED PORT A CATH PLACEMENT WITH ULTRASOUND AND FLUOROSCOPIC GUIDANCE IMAGE GUIDED BIOPSY OF LIVER MASS MEDICATIONS: None ANESTHESIA/SEDATION: Moderate (conscious) sedation was employed during this procedure. A total of Versed 1.5 mg and Fentanyl 37.5 mcg was administered intravenously by the radiology nurse. Total intra-service moderate Sedation Time: 38 minutes. The patient's level of consciousness and vital signs were monitored continuously by radiology nursing throughout the procedure under my direct supervision. FLUOROSCOPY: Radiation Exposure Index (as provided by the fluoroscopic device): 3 mGy Kerma COMPLICATIONS: None PROCEDURE: Informed written consent was obtained from the patient after a thorough discussion of the procedural risks, benefits and alternatives. All questions were addressed. Maximal Sterile Barrier Technique was utilized including caps, mask, sterile gowns, sterile gloves, sterile drape, hand hygiene and skin antiseptic. A timeout was performed prior to the initiation of the procedure. Ultrasound survey was performed with images stored and sent to PACs. Right  IJ vein documented to be patent. The right neck and chest was prepped with chlorhexidine, and draped in the usual sterile fashion using maximum barrier technique (cap and mask,  sterile gown, sterile gloves, large sterile sheet, hand hygiene and cutaneous antiseptic). Local anesthesia was attained by infiltration with 1% lidocaine without epinephrine. Ultrasound demonstrated patency of the right internal jugular vein, and this was documented with an image. Under real-time ultrasound guidance, this vein was accessed with a 21 gauge micropuncture needle and image documentation was performed. A small dermatotomy was made at the access site with an 11 scalpel. A 0.018" wire was advanced into the SVC and used to estimate the length of the internal catheter. The access needle exchanged for a 60F micropuncture vascular sheath. The 0.018" wire was then removed and a 0.035" wire advanced into the IVC. An appropriate location for the subcutaneous reservoir was selected below the clavicle and an incision was made through the skin and underlying soft tissues. The subcutaneous tissues were then dissected using a combination of blunt and sharp surgical technique and a pocket was formed. A single lumen power injectable portacatheter was then tunneled through the subcutaneous tissues from the pocket to the dermatotomy and the port reservoir placed within the subcutaneous pocket. The venous access site was then serially dilated and a peel away vascular sheath placed over the wire. The wire was removed and the port catheter advanced into position under fluoroscopic guidance. The catheter tip is positioned in the cavoatrial junction. This was documented with a spot image. The portacatheter was then tested and found to flush and aspirate well. The port was flushed with saline followed by 100 units/mL heparinized saline. The pocket was then closed in two layers using first subdermal inverted interrupted absorbable sutures followed by a running subcuticular suture. The epidermis was then sealed with Dermabond. The dermatotomy at the venous access site was also seal with Dermabond. We then proceeded with liver  mass biopsy. Ultrasound survey of the right liver lobe performed with images stored and sent to PACs. The right lower thorax/right upper abdomen was prepped with chlorhexidine in a sterile fashion, and a sterile drape was applied covering the operative field. A sterile gown and sterile gloves were used for the procedure. Local anesthesia was provided with 1% Lidocaine. The patient was prepped and draped sterilely and the skin and subcutaneous tissues were generously infiltrated with 1% lidocaine. A 17 gauge introducer needle was then advanced under ultrasound guidance in an intercostal location into the right liver lobe, targeting heterogeneously echoic mass. The stylet was removed, and multiple separate 18 gauge core biopsy were retrieved. Samples were placed into formalin for transportation to the lab. Gel-Foam pledgets were then infused with a small amount of saline for assistance with hemostasis. The needle was removed, and a final ultrasound image was performed. The patient tolerated the procedure well and remained hemodynamically stable throughout. No complications were encountered and no significant blood loss was encounter. IMPRESSION: Status post image guided right IJ port catheter placement and image guided right liver mass biopsy. Signed, Yvone Neu. Miachel Roux, RPVI [facility] Electronically Signed   By: Gilmer Mor D.O.   On: 01/22/2023 14:11    Medications: I have reviewed the patient's current medications.   Assessment/Plan: Sigmoid colon mass, multiple liver lesions Ultrasound abdomen 01/08/2023-multifocal hyperechoic liver lesions, largest measuring 8.7 cm in the right hepatic lobe CT abdomen/pelvis 01/08/2023-multiple by lobar hypoattenuating hepatic lesions, masslike thickening of the sigmoid colon  with abnormal spiculated pericolonic soft tissue.  IMA lymphadenopathy.  Subcentimeter pancreatic hypodensities without pancreatic  duct dilation Diabetes Cardiomyopathy Hypertension Hypercholesterolemia Weight loss secondary to [facility], metastatic carcinoma? Microcytic anemia secondary to #1     Disposition: [name] has a clinical presentation consistent with metastatic colon cancer.  He underwent a biopsy of a liver lesion earlier this week.  The pathology is pending.  We will follow-up on the pathology report with the plan to proceed with FOLFOX chemotherapy if the biopsy is consistent with metastatic colon cancer.  We will submit the tissue for mismatch repair protein and molecular testing.  Mr. Flattery agrees to proceed with FOLFOX.  He will be referred for an urgent diagnostic sigmoidoscopy or colonoscopy.  We reviewed potential toxicities associated with the FOLFOX regimen including the chance of nausea/vomiting, mucositis, diarrhea, alopecia, and hematologic toxicity.  We discussed the cardiac toxicity, rash, hyperpigmentation, sun sensitivity, and hand/foot syndrome associated with 5-fluorouracil.  We discussed the allergic reaction and various types of neuropathy seen with oxaliplatin.  He agrees to proceed.  We will follow-up on results of molecular testing and consider adding bevacizumab or panitumumab beginning with cycle 2.  Mr Irene will be scheduled for cycle 1 FOLFOX on 01/30/2023.  He will return for an office visit prior to cycle 2.  A chemotherapy plan was entered today.  Thornton Papas, MD  01/25/2023  9:50 AM

## 2023-01-26 ENCOUNTER — Other Ambulatory Visit: Payer: Self-pay

## 2023-01-27 ENCOUNTER — Other Ambulatory Visit: Payer: Self-pay | Admitting: Oncology

## 2023-01-28 ENCOUNTER — Telehealth: Payer: Self-pay | Admitting: *Deleted

## 2023-01-28 NOTE — Telephone Encounter (Signed)
Returned call to patient. Pt states that he picked up his prep but he was no longer able to pull up his instructions via MyChart. I reviewed instructions with patient again, clear liquids today. No dairy products, nothing red or purple. 1st half of prep will begin at 6 pm, 2nd half will begin tomorrow morning at 3 am. I advised pt that I will send instructions via MyChart message instead of letter. Pt will let me know if he has any issues accessing this information. Pt verbalized understanding and had no concerns at the end of the call.

## 2023-01-28 NOTE — Telephone Encounter (Signed)
Inbound call from patient requesting to speak with you in regards instruction for up coming procedure .Please advise .

## 2023-01-29 ENCOUNTER — Encounter: Payer: Self-pay | Admitting: Gastroenterology

## 2023-01-29 ENCOUNTER — Ambulatory Visit (AMBULATORY_SURGERY_CENTER): Payer: Medicare Other | Admitting: Gastroenterology

## 2023-01-29 VITALS — BP 100/61 | HR 83 | Temp 97.5°F | Resp 17 | Ht 66.0 in | Wt 208.2 lb

## 2023-01-29 DIAGNOSIS — D125 Benign neoplasm of sigmoid colon: Secondary | ICD-10-CM | POA: Diagnosis not present

## 2023-01-29 DIAGNOSIS — D122 Benign neoplasm of ascending colon: Secondary | ICD-10-CM

## 2023-01-29 DIAGNOSIS — D120 Benign neoplasm of cecum: Secondary | ICD-10-CM

## 2023-01-29 DIAGNOSIS — R933 Abnormal findings on diagnostic imaging of other parts of digestive tract: Secondary | ICD-10-CM

## 2023-01-29 DIAGNOSIS — C187 Malignant neoplasm of sigmoid colon: Secondary | ICD-10-CM

## 2023-01-29 DIAGNOSIS — D128 Benign neoplasm of rectum: Secondary | ICD-10-CM | POA: Diagnosis not present

## 2023-01-29 DIAGNOSIS — K6389 Other specified diseases of intestine: Secondary | ICD-10-CM

## 2023-01-29 DIAGNOSIS — C20 Malignant neoplasm of rectum: Secondary | ICD-10-CM | POA: Diagnosis not present

## 2023-01-29 MED ORDER — SODIUM CHLORIDE 0.9 % IV SOLN
500.00 mL | Freq: Once | INTRAVENOUS | Status: AC
Start: 2023-01-29 — End: ?

## 2023-01-29 NOTE — Progress Notes (Signed)
See 01/14/2023 H&P, no changes

## 2023-01-29 NOTE — Op Note (Addendum)
[facility] Patient Name: Daniel [name] Procedure Date: 01/29/2023 7:59 AM MRN: [id number] Endoscopist: Meryl Dare , MD, [id number] Age: 57 Referring MD:  Date of Birth: 04/26/1966 Gender: Male Account #: [id number] Procedure:                Colonoscopy Indications:              Abnormal CT of the GI tract (colon), Colon mass Medicines:                Monitored Anesthesia Care Procedure:                Pre-Anesthesia Assessment:                           - Prior to the procedure, a History and Physical                            was performed, and patient medications and                            allergies were reviewed. The patient's tolerance of                            previous anesthesia was also reviewed. The risks                            and benefits of the procedure and the sedation                            options and risks were discussed with the patient.                            All questions were answered, and informed consent                            was obtained. Prior Anticoagulants: The patient has                            taken no anticoagulant or antiplatelet agents. ASA                            Grade Assessment: III - A patient with severe                            systemic disease. After reviewing the risks and                            benefits, the patient was deemed in satisfactory                            condition to undergo the procedure.                           After obtaining informed consent, the colonoscope  was passed under direct vision. Throughout the                            procedure, the patient's blood pressure, pulse, and                            oxygen saturations were monitored continuously. The                            Olympus Scope SN: [id number] was introduced through                            the anus and advanced to the the cecum, identified                            by  appendiceal orifice and ileocecal valve. The                            ileocecal valve, appendiceal orifice, and rectum                            were photographed. The quality of the bowel                            preparation was adequate. The colonoscopy was                            performed without difficulty. The patient tolerated                            the procedure well. Scope In: 8:03:40 AM Scope Out: 8:27:55 AM Scope Withdrawal Time: 0 hours 21 minutes 50 seconds  Total Procedure Duration: 0 hours 24 minutes 15 seconds  Findings:                 The perianal and digital rectal examinations were                            normal.                           Four sessile polyps were found in the rectum,                            ascending colon and cecum (2). The polyps were 6 to                            9 mm in size. These polyps were removed with a cold                            snare. Resection and retrieval were complete.                           A 14 mm polyp was found in the sigmoid colon.  The                            polyp was pedunculated. The polyp was removed with                            a hot snare. Resection and retrieval were complete.                           A fungating non-obstructing medium-sized mass was                            found in the sigmoid colon. The mass was partially                            circumferential (involving two-thirds of the lumen                            circumference). The mass measured four cm in                            length. No bleeding was present. Biopsies were                            taken with a cold forceps for histology.                           A 10 mm polyp was found in the rectum. The polyp                            was semi-pedunculated. The polyp was removed with a                            hot snare. Resection and retrieval were complete.                           External hemorrhoids were  found during                            retroflexion. The hemorrhoids were small.                           The exam was otherwise without abnormality on                            direct and retroflexion views. Complications:            No immediate complications. Estimated blood loss:                            None. Estimated Blood Loss:     Estimated blood loss: none. Impression:               - Four 6 to 9 mm polyps in the rectum, in the  ascending colon and in the cecum, removed with a                            cold snare. Resected and retrieved.                           - One 14 mm polyp in the sigmoid colon, removed                            with a hot snare. Resected and retrieved.                           - Malignant tumor in the sigmoid colon. Biopsied.                           - One 10 mm polyp in the rectum, removed with a hot                            snare. Resected and retrieved.                           - Small external hemorrhoids.                           - The examination was otherwise normal on direct                            and retroflexion views. Recommendation:           - Repeat colonoscopy, likely 1 year, after studies                            are complete for surveillance based on pathology                            results.                           - Patient has a contact number available for                            emergencies. The signs and symptoms of potential                            delayed complications were discussed with the                            patient. Return to normal activities tomorrow.                            Written discharge instructions were provided to the                            patient.                           -  Resume previous diet.                           - Continue present medications.                           - Await pathology results.                           - No  aspirin, ibuprofen, naproxen, or other                            non-steroidal anti-inflammatory drugs for 2 weeks                            after polyp removal. Meryl Dare, MD 01/29/2023 8:35:05 AM This report has been signed electronically.

## 2023-01-29 NOTE — Progress Notes (Signed)
A and O x3. Report to RN. Tolerated MAC anesthesia well. 

## 2023-01-29 NOTE — Patient Instructions (Signed)
YOU HAD AN ENDOSCOPIC PROCEDURE TODAY AT THE [facility]:   Refer to the procedure report that was given to you for any specific questions about what was found during the examination.  If the procedure report does not answer your questions, please call your gastroenterologist to clarify.  If you requested that your care partner not be given the details of your procedure findings, then the procedure report has been included in a sealed envelope for you to review at your convenience later.  YOU SHOULD EXPECT: Some feelings of bloating in the abdomen. Passage of more gas than usual.  Walking can help get rid of the air that was put into your GI tract during the procedure and reduce the bloating. If you had a lower endoscopy (such as a colonoscopy or flexible sigmoidoscopy) you may notice spotting of blood in your stool or on the toilet paper. If you underwent a bowel prep for your procedure, you may not have a normal bowel movement for a few days.  Please Note:  You might notice some irritation and congestion in your nose or some drainage.  This is from the oxygen used during your procedure.  There is no need for concern and it should clear up in a day or so.  SYMPTOMS TO REPORT IMMEDIATELY:  Following lower endoscopy (colonoscopy or flexible sigmoidoscopy):  Excessive amounts of blood in the stool  Significant tenderness or worsening of abdominal pains  Swelling of the abdomen that is new, acute  Fever of 100F or higher   For urgent or emergent issues, a gastroenterologist can be reached at any hour by calling [phone]. Do not use MyChart messaging for urgent concerns.    DIET:  We do recommend a small meal at first, but then you may proceed to your regular diet.  Drink plenty of fluids but you should avoid alcoholic beverages for 24 hours.  MEDICATIONS: Continue present medications. NO Aspirin, Ibuprofen, Naproxen, or other non-steroidal anti-inflammatory drugs for 2 weeks  after polyp removal.  FOLLOW UP: Await pathology results. Repeat colonoscopy, likely 1 year, after studies are complete for surveillance based on pathology results.  Please see handouts given to you by your recovery nurse: Polyps, Hemorrhoids.  Thank you for allowing [place] to provide for your healthcare needs today.  ACTIVITY:  You should plan to take it easy for the rest of today and you should NOT DRIVE or use heavy machinery until tomorrow (because of the sedation medicines used during the test).    FOLLOW UP: Our staff will call the number listed on your records the next business day following your procedure.  We will call around 7:15- 8:00 am to check on you and address any questions or concerns that you may have regarding the information given to you following your procedure. If we do not reach you, we will leave a message.     If any biopsies were taken you will be contacted by phone or by letter within the next 1-3 weeks.  Please call us at [phone] if you have not heard about the biopsies in 3 weeks.    SIGNATURES/CONFIDENTIALITY: You and/or your care partner have signed paperwork which will be entered into your electronic medical record.  These signatures attest to the fact that that the information above on your After Visit Summary has been reviewed and is understood.  Full responsibility of the confidentiality of this discharge information lies with you and/or your care-partner.

## 2023-01-30 ENCOUNTER — Inpatient Hospital Stay: Payer: Medicare Other | Admitting: Nutrition

## 2023-01-30 ENCOUNTER — Telehealth: Payer: Self-pay

## 2023-01-30 ENCOUNTER — Inpatient Hospital Stay: Payer: Medicare Other

## 2023-01-30 VITALS — BP 111/76 | HR 90 | Temp 98.1°F | Resp 18

## 2023-01-30 DIAGNOSIS — Z5111 Encounter for antineoplastic chemotherapy: Secondary | ICD-10-CM | POA: Diagnosis not present

## 2023-01-30 DIAGNOSIS — C187 Malignant neoplasm of sigmoid colon: Secondary | ICD-10-CM

## 2023-01-30 LAB — CMP (CANCER CENTER ONLY)
ALT: 41 U/L (ref 0–44)
AST: 45 U/L — ABNORMAL HIGH (ref 15–41)
Albumin: 3.7 g/dL (ref 3.5–5.0)
Alkaline Phosphatase: 373 U/L — ABNORMAL HIGH (ref 38–126)
Anion gap: 10 (ref 5–15)
BUN: 14 mg/dL (ref 6–20)
CO2: 28 mmol/L (ref 22–32)
Calcium: 9.3 mg/dL (ref 8.9–10.3)
Chloride: 98 mmol/L (ref 98–111)
Creatinine: 0.77 mg/dL (ref 0.61–1.24)
GFR, Estimated: >60 mL/min (ref >60)
Glucose, Bld: 245 mg/dL — ABNORMAL HIGH (ref 70–99)
Potassium: 3.6 mmol/L (ref 3.5–5.1)
Sodium: 136 mmol/L (ref 135–145)
Total Bilirubin: 0.4 mg/dL (ref 0.3–1.2)
Total Protein: 7.3 g/dL (ref 6.5–8.1)

## 2023-01-30 LAB — CBC WITH DIFFERENTIAL (CANCER CENTER ONLY)
Abs Immature Granulocytes: 0.08 10*3/uL — ABNORMAL HIGH (ref 0.00–0.07)
Basophils Absolute: 0.1 10*3/uL (ref 0.0–0.1)
Basophils Relative: 0 %
Eosinophils Absolute: 0.6 10*3/uL — ABNORMAL HIGH (ref 0.0–0.5)
Eosinophils Relative: 3 %
HCT: 36.9 % — ABNORMAL LOW (ref 39.0–52.0)
Hemoglobin: 11 g/dL — ABNORMAL LOW (ref 13.0–17.0)
Immature Granulocytes: 1 %
Lymphocytes Relative: 12 %
Lymphs Abs: 2 10*3/uL (ref 0.7–4.0)
MCH: 22.5 pg — ABNORMAL LOW (ref 26.0–34.0)
MCHC: 29.8 g/dL — ABNORMAL LOW (ref 30.0–36.0)
MCV: 75.6 fL — ABNORMAL LOW (ref 80.0–100.0)
Monocytes Absolute: 1.5 10*3/uL — ABNORMAL HIGH (ref 0.1–1.0)
Monocytes Relative: 9 %
Neutro Abs: 13.2 10*3/uL — ABNORMAL HIGH (ref 1.7–7.7)
Neutrophils Relative %: 75 %
Platelet Count: 458 10*3/uL — ABNORMAL HIGH (ref 150–400)
RBC: 4.88 MIL/uL (ref 4.22–5.81)
RDW: 17.6 % — ABNORMAL HIGH (ref 11.5–15.5)
WBC Count: 17.4 10*3/uL — ABNORMAL HIGH (ref 4.0–10.5)
nRBC: 0 % (ref 0.0–0.2)

## 2023-01-30 LAB — CEA (ACCESS): CEA (CHCC): 1792.5 ng/mL — ABNORMAL HIGH (ref 0.00–5.00)

## 2023-01-30 MED ORDER — SODIUM CHLORIDE 0.9 % IV SOLN: Freq: Once | INTRAVENOUS | Status: AC

## 2023-01-30 MED ORDER — SODIUM CHLORIDE 0.9 % IV SOLN
2400.0000 mg/m2 | INTRAVENOUS | Status: DC
  Administered 2023-01-30: 5000 mg via INTRAVENOUS
  Filled 2023-01-30: qty 100

## 2023-01-30 MED ORDER — FLUOROURACIL CHEMO INJECTION 2.5 GM/50ML
400.0000 mg/m2 | Freq: Once | INTRAVENOUS | Status: AC
  Administered 2023-01-30: 850 mg via INTRAVENOUS
  Filled 2023-01-30: qty 17

## 2023-01-30 MED ORDER — PALONOSETRON HCL INJECTION 0.25 MG/5ML
0.2500 mg | Freq: Once | INTRAVENOUS | Status: AC
  Administered 2023-01-30: 0.25 mg via INTRAVENOUS
  Filled 2023-01-30: qty 5

## 2023-01-30 MED ORDER — DEXTROSE 5 % IV SOLN: Freq: Once | INTRAVENOUS | Status: AC

## 2023-01-30 MED ORDER — LEUCOVORIN CALCIUM INJECTION 350 MG
400.0000 mg/m2 | Freq: Once | INTRAVENOUS | Status: AC
  Administered 2023-01-30: 844 mg via INTRAVENOUS
  Filled 2023-01-30: qty 42.2

## 2023-01-30 MED ORDER — SODIUM CHLORIDE 0.9 % IV SOLN
10.0000 mg | Freq: Once | INTRAVENOUS | Status: AC
  Administered 2023-01-30: 10 mg via INTRAVENOUS
  Filled 2023-01-30: qty 10

## 2023-01-30 MED ORDER — OXALIPLATIN CHEMO INJECTION 100 MG/20ML
85.0000 mg/m2 | Freq: Once | INTRAVENOUS | Status: AC
  Administered 2023-01-30: 180 mg via INTRAVENOUS
  Filled 2023-01-30: qty 36

## 2023-01-30 NOTE — Patient Instructions (Addendum)
The chemotherapy medication bag should finish at 46 hours, 96 hours, or 7 days. For example, if your pump is scheduled for 46 hours and it was put on at 4:00 p.m., it should finish at 2:00 p.m. the day it is scheduled to come off regardless of your appointment time.     Estimated time to finish at 1:30pm on Friday 02/01/23.   If the display on your pump reads "Low Volume" and it is beeping, take the batteries out of the pump and come to the cancer center for it to be taken off.   If the pump alarms go off prior to the pump reading "Low Volume" then call [phone] and someone can assist you.  If the plunger comes out and the chemotherapy medication is leaking out, please use your home chemo spill kit to clean up the spill. Do NOT use paper towels or other household products.  If you have problems or questions regarding your pump, please call either [phone] (24 hours a day) or the cancer center Monday-Friday 8:00 a.m.- 4:30 p.m. at the clinic number and we will assist you. If you are unable to get assistance, then go to the nearest Emergency Department and ask the staff to contact the IV team for assistance.    [facility]  Discharge Instructions: Thank you for choosing [facility] to provide your oncology and hematology care.   If you have a lab appointment with the Cancer Center, please go directly to the Cancer Center and check in at the registration area.   Wear comfortable clothing and clothing appropriate for easy access to any Portacath or PICC line.   We strive to give you quality time with your provider. You may need to reschedule your appointment if you arrive late (15 or more minutes).  Arriving late affects you and other patients whose appointments are after yours.  Also, if you miss three or more appointments without notifying the office, you may be dismissed from the clinic at the provider's discretion.      For  prescription refill requests, have your pharmacy contact our office and allow 72 hours for refills to be completed.    Today you received the following chemotherapy and/or immunotherapy agents: oxaliplatin, leucovorin, fluorouracil.       To help prevent nausea and vomiting after your treatment, we encourage you to take your nausea medication as directed.  BELOW ARE SYMPTOMS THAT SHOULD BE REPORTED IMMEDIATELY: *FEVER GREATER THAN 100.4 F (38 C) OR HIGHER *CHILLS OR SWEATING *NAUSEA AND VOMITING THAT IS NOT CONTROLLED WITH YOUR NAUSEA MEDICATION *UNUSUAL SHORTNESS OF BREATH *UNUSUAL BRUISING OR BLEEDING *URINARY PROBLEMS (pain or burning when urinating, or frequent urination) *BOWEL PROBLEMS (unusual diarrhea, constipation, pain near the anus) TENDERNESS IN MOUTH AND THROAT WITH OR WITHOUT PRESENCE OF ULCERS (sore throat, sores in mouth, or a toothache) UNUSUAL RASH, SWELLING OR PAIN  UNUSUAL VAGINAL DISCHARGE OR ITCHING   Items with * indicate a potential emergency and should be followed up as soon as possible or go to the Emergency Department if any problems should occur.  Please show the CHEMOTHERAPY ALERT CARD or IMMUNOTHERAPY ALERT CARD at check-in to the Emergency Department and triage nurse.  Should you have questions after your visit or need to cancel or reschedule your appointment, please contact [facility]  Dept: [phone]  and follow the prompts.  Office hours are 8:00 a.m. to 4:30 p.m. Monday - Friday. Please  note that voicemails left after 4:00 p.m. may not be returned until the following business day.  We are closed weekends and major holidays. You have access to a nurse at all times for urgent questions. Please call the main number to the clinic Dept: [phone] and follow the prompts.   For any non-urgent questions, you may also contact your provider using MyChart. We now offer e-Visits for anyone 93 and older to request care online  for non-urgent symptoms. For details visit [email or web address].   Also download the MyChart app! Go to the app store, search "MyChart", open the app, select , and log in with your MyChart username and password.  Oxaliplatin Injection What is this medication? OXALIPLATIN (ox AL i PLA tin) treats colorectal cancer. It works by slowing down the growth of cancer cells. This medicine may be used for other purposes; ask your health care provider or pharmacist if you have questions. COMMON BRAND NAME(S): Eloxatin What should I tell my care team before I take this medication? They need to know if you have any of these conditions: Heart disease History of irregular heartbeat or rhythm Liver disease Low blood cell levels (white cells, red cells, and platelets) Lung or breathing disease, such as asthma Take medications that treat or prevent blood clots Tingling of the fingers, toes, or other nerve disorder An unusual or allergic reaction to oxaliplatin, other medications, foods, dyes, or preservatives If you or your partner are pregnant or trying to get pregnant Breast-feeding How should I use this medication? This medication is injected into a vein. It is given by your care team in a hospital or clinic setting. Talk to your care team about the use of this medication in children. Special care may be needed. Overdosage: If you think you have taken too much of this medicine contact a poison control center or emergency room at once. NOTE: This medicine is only for you. Do not share this medicine with others. What if I miss a dose? Keep appointments for follow-up doses. It is important not to miss a dose. Call your care team if you are unable to keep an appointment. What may interact with this medication? Do not take this medication with any of the following: Cisapride Dronedarone Pimozide Thioridazine This medication may also interact with the following: Aspirin and aspirin-like  medications Certain medications that treat or prevent blood clots, such as warfarin, apixaban, dabigatran, and rivaroxaban Cisplatin Cyclosporine Diuretics Medications for infection, such as acyclovir, adefovir, amphotericin B, bacitracin, cidofovir, foscarnet, ganciclovir, gentamicin, pentamidine, vancomycin NSAIDs, medications for pain and inflammation, such as ibuprofen or naproxen Other medications that cause heart rhythm changes Pamidronate Zoledronic acid This list may not describe all possible interactions. Give your health care provider a list of all the medicines, herbs, non-prescription drugs, or dietary supplements you use. Also tell them if you smoke, drink alcohol, or use illegal drugs. Some items may interact with your medicine. What should I watch for while using this medication? Your condition will be monitored carefully while you are receiving this medication. You may need blood work while taking this medication. This medication may make you feel generally unwell. This is not uncommon as chemotherapy can affect healthy cells as well as cancer cells. Report any side effects. Continue your course of treatment even though you feel ill unless your care team tells you to stop. This medication may increase your risk of getting an infection. Call your care team for advice if you get a fever, chills, sore throat,  or other symptoms of a cold or flu. Do not treat yourself. Try to avoid being around people who are sick. Avoid taking medications that contain aspirin, acetaminophen, ibuprofen, naproxen, or ketoprofen unless instructed by your care team. These medications may hide a fever. Be careful brushing or flossing your teeth or using a toothpick because you may get an infection or bleed more easily. If you have any dental work done, tell your dentist you are receiving this medication. This medication can make you more sensitive to cold. Do not drink cold drinks or use ice. Cover exposed  skin before coming in contact with cold temperatures or cold objects. When out in cold weather wear warm clothing and cover your mouth and nose to warm the air that goes into your lungs. Tell your care team if you get sensitive to the cold. Talk to your care team if you or your partner are pregnant or think either of you might be pregnant. This medication can cause serious birth defects if taken during pregnancy and for 9 months after the last dose. A negative pregnancy test is required before starting this medication. A reliable form of contraception is recommended while taking this medication and for 9 months after the last dose. Talk to your care team about effective forms of contraception. Do not father a child while taking this medication and for 6 months after the last dose. Use a condom while having sex during this time period. Do not breastfeed while taking this medication and for 3 months after the last dose. This medication may cause infertility. Talk to your care team if you are concerned about your fertility. What side effects may I notice from receiving this medication? Side effects that you should report to your care team as soon as possible: Allergic reactions--skin rash, itching, hives, swelling of the face, lips, tongue, or throat Bleeding--bloody or black, tar-like stools, vomiting blood or brown material that looks like coffee grounds, red or dark brown urine, small red or purple spots on skin, unusual bruising or bleeding Dry cough, shortness of breath or trouble breathing Heart rhythm changes--fast or irregular heartbeat, dizziness, feeling faint or lightheaded, chest pain, trouble breathing Infection--fever, chills, cough, sore throat, wounds that don't heal, pain or trouble when passing urine, general feeling of discomfort or being unwell Liver injury--right upper belly pain, loss of appetite, nausea, light-colored stool, dark yellow or brown urine, yellowing skin or eyes, unusual  weakness or fatigue Low red blood cell level--unusual weakness or fatigue, dizziness, headache, trouble breathing Muscle injury--unusual weakness or fatigue, muscle pain, dark yellow or brown urine, decrease in amount of urine Pain, tingling, or numbness in the hands or feet Sudden and severe headache, confusion, change in vision, seizures, which may be signs of posterior reversible encephalopathy syndrome (PRES) Unusual bruising or bleeding Side effects that usually do not require medical attention (report to your care team if they continue or are bothersome): Diarrhea Nausea Pain, redness, or swelling with sores inside the mouth or throat Unusual weakness or fatigue Vomiting This list may not describe all possible side effects. Call your doctor for medical advice about side effects. You may report side effects to FDA at 1-800-FDA-1088. Where should I keep my medication? This medication is given in a hospital or clinic. It will not be stored at home. NOTE: This sheet is a summary. It may not cover all possible information. If you have questions about this medicine, talk to your doctor, pharmacist, or health care provider.  2024 Elsevier/Gold  Standard (2022-09-16 00:00:00)  Leucovorin Injection What is this medication? LEUCOVORIN (loo koe VOR in) prevents side effects from certain medications, such as methotrexate. It works by increasing folate levels. This helps protect healthy cells in your body. It may also be used to treat anemia caused by low levels of folate. It can also be used with fluorouracil, a type of chemotherapy, to treat colorectal cancer. It works by increasing the effects of fluorouracil in the body. This medicine may be used for other purposes; ask your health care provider or pharmacist if you have questions. What should I tell my care team before I take this medication? They need to know if you have any of these conditions: Anemia from low levels of vitamin B12 in the  blood An unusual or allergic reaction to leucovorin, folic acid, other medications, foods, dyes, or preservatives Pregnant or trying to get pregnant Breastfeeding How should I use this medication? This medication is injected into a vein or a muscle. It is given by your care team in a hospital or clinic setting. Talk to your care team about the use of this medication in children. Special care may be needed. Overdosage: If you think you have taken too much of this medicine contact a poison control center or emergency room at once. NOTE: This medicine is only for you. Do not share this medicine with others. What if I miss a dose? Keep appointments for follow-up doses. It is important not to miss your dose. Call your care team if you are unable to keep an appointment. What may interact with this medication? Capecitabine Fluorouracil Phenobarbital Phenytoin Primidone Trimethoprim;sulfamethoxazole This list may not describe all possible interactions. Give your health care provider a list of all the medicines, herbs, non-prescription drugs, or dietary supplements you use. Also tell them if you smoke, drink alcohol, or use illegal drugs. Some items may interact with your medicine. What should I watch for while using this medication? Your condition will be monitored carefully while you are receiving this medication. This medication may increase the side effects of 5-fluorouracil. Tell your care team if you have diarrhea or mouth sores that do not get better or that get worse. What side effects may I notice from receiving this medication? Side effects that you should report to your care team as soon as possible: Allergic reactions--skin rash, itching, hives, swelling of the face, lips, tongue, or throat This list may not describe all possible side effects. Call your doctor for medical advice about side effects. You may report side effects to FDA at 1-800-FDA-1088. Where should I keep my  medication? This medication is given in a hospital or clinic. It will not be stored at home. NOTE: This sheet is a summary. It may not cover all possible information. If you have questions about this medicine, talk to your doctor, pharmacist, or health care provider.  2024 Elsevier/Gold Standard (2021-12-12 00:00:00)  Fluorouracil Injection What is this medication? FLUOROURACIL (flure oh YOOR a sil) treats some types of cancer. It works by slowing down the growth of cancer cells. This medicine may be used for other purposes; ask your health care provider or pharmacist if you have questions. COMMON BRAND NAME(S): Adrucil What should I tell my care team before I take this medication? They need to know if you have any of these conditions: Blood disorders Dihydropyrimidine dehydrogenase (DPD) deficiency Infection, such as chickenpox, cold sores, herpes Kidney disease Liver disease Poor nutrition Recent or ongoing radiation therapy An unusual or allergic reaction  to fluorouracil, other medications, foods, dyes, or preservatives If you or your partner are pregnant or trying to get pregnant Breast-feeding How should I use this medication? This medication is injected into a vein. It is administered by your care team in a hospital or clinic setting. Talk to your care team about the use of this medication in children. Special care may be needed. Overdosage: If you think you have taken too much of this medicine contact a poison control center or emergency room at once. NOTE: This medicine is only for you. Do not share this medicine with others. What if I miss a dose? Keep appointments for follow-up doses. It is important not to miss your dose. Call your care team if you are unable to keep an appointment. What may interact with this medication? Do not take this medication with any of the following: Live virus vaccines This medication may also interact with the following: Medications that treat  or prevent blood clots, such as warfarin, enoxaparin, dalteparin This list may not describe all possible interactions. Give your health care provider a list of all the medicines, herbs, non-prescription drugs, or dietary supplements you use. Also tell them if you smoke, drink alcohol, or use illegal drugs. Some items may interact with your medicine. What should I watch for while using this medication? Your condition will be monitored carefully while you are receiving this medication. This medication may make you feel generally unwell. This is not uncommon as chemotherapy can affect healthy cells as well as cancer cells. Report any side effects. Continue your course of treatment even though you feel ill unless your care team tells you to stop. In some cases, you may be given additional medications to help with side effects. Follow all directions for their use. This medication may increase your risk of getting an infection. Call your care team for advice if you get a fever, chills, sore throat, or other symptoms of a cold or flu. Do not treat yourself. Try to avoid being around people who are sick. This medication may increase your risk to bruise or bleed. Call your care team if you notice any unusual bleeding. Be careful brushing or flossing your teeth or using a toothpick because you may get an infection or bleed more easily. If you have any dental work done, tell your dentist you are receiving this medication. Avoid taking medications that contain aspirin, acetaminophen, ibuprofen, naproxen, or ketoprofen unless instructed by your care team. These medications may hide a fever. Do not treat diarrhea with over the counter products. Contact your care team if you have diarrhea that lasts more than 2 days or if it is severe and watery. This medication can make you more sensitive to the sun. Keep out of the sun. If you cannot avoid being in the sun, wear protective clothing and sunscreen. Do not use sun lamps,  tanning beds, or tanning booths. Talk to your care team if you or your partner wish to become pregnant or think you might be pregnant. This medication can cause serious birth defects if taken during pregnancy and for 3 months after the last dose. A reliable form of contraception is recommended while taking this medication and for 3 months after the last dose. Talk to your care team about effective forms of contraception. Do not father a child while taking this medication and for 3 months after the last dose. Use a condom while having sex during this time period. Do not breastfeed while taking this medication. This  medication may cause infertility. Talk to your care team if you are concerned about your fertility. What side effects may I notice from receiving this medication? Side effects that you should report to your care team as soon as possible: Allergic reactions--skin rash, itching, hives, swelling of the face, lips, tongue, or throat Heart attack--pain or tightness in the chest, shoulders, arms, or jaw, nausea, shortness of breath, cold or clammy skin, feeling faint or lightheaded Heart failure--shortness of breath, swelling of the ankles, feet, or hands, sudden weight gain, unusual weakness or fatigue Heart rhythm changes--fast or irregular heartbeat, dizziness, feeling faint or lightheaded, chest pain, trouble breathing High ammonia level--unusual weakness or fatigue, confusion, loss of appetite, nausea, vomiting, seizures Infection--fever, chills, cough, sore throat, wounds that don't heal, pain or trouble when passing urine, general feeling of discomfort or being unwell Low red blood cell level--unusual weakness or fatigue, dizziness, headache, trouble breathing Pain, tingling, or numbness in the hands or feet, muscle weakness, change in vision, confusion or trouble speaking, loss of balance or coordination, trouble walking, seizures Redness, swelling, and blistering of the skin over hands and  feet Severe or prolonged diarrhea Unusual bruising or bleeding Side effects that usually do not require medical attention (report to your care team if they continue or are bothersome): Dry skin Headache Increased tears Nausea Pain, redness, or swelling with sores inside the mouth or throat Sensitivity to light Vomiting This list may not describe all possible side effects. Call your doctor for medical advice about side effects. You may report side effects to FDA at 1-800-FDA-1088. Where should I keep my medication? This medication is given in a hospital or clinic. It will not be stored at home. NOTE: This sheet is a summary. It may not cover all possible information. If you have questions about this medicine, talk to your doctor, pharmacist, or health care provider.  2024 Elsevier/Gold Standard (2021-11-14 00:00:00)

## 2023-01-30 NOTE — Patient Instructions (Signed)

## 2023-01-30 NOTE — Progress Notes (Signed)
Nutrition follow up completed with patient and wife during FOLFOX infusion for metastatic colon cancer. Today is cycle 1. He is followed by Dr. Truett Perna.  Weight documented as 210 pounds 13 oz today. Stable from 211.6 pounds June 28.  Labs include Glucose of 245.  Noted Joycelyn Man was discontinued on July 5.  Patient wears a continuous glucose monitor and states his BS are within range of 70-200 90% of the time. He had a colonoscopy yesterday so food intake has not been usual. Reports he craved a donut after procedure and reports drinking chocolate milk as well. He wants to keep his weight around 210 pounds while going through treatment. Denies questions or concerns.  Nutrition Diagnosis: Food and Nutrition Related Knowledge Deficit, improved.  Intervention: Encouraged weight maintenance. Educated on importance of keeping blood sugar within an acceptable range. Reviewed NCS diet and educated on strategies for better food choices.  Monitoring, Evaluation, Goals: Patient will tolerate adequate calories and protein to minimize wt loss.  Next Visit: Will follow as needed.

## 2023-01-30 NOTE — Progress Notes (Signed)
Patient's BP 89/65 and HR 108.  Per patient, his BP tends to run in the 90s systolic and HR tends to run around 100, but that he does feel dehydrated after her procedure on 01/29/23.  Dr. Thornton Papas made aware. Per Dr. Truett Perna, give bolus of fluids over 30 min and recheck BP and HR. Recheck BP 95/62, HR 100. Dr. Truett Perna made aware. Ok to proceed with treatment.

## 2023-01-30 NOTE — Telephone Encounter (Signed)
Left message on answering machine. 

## 2023-01-31 ENCOUNTER — Telehealth: Payer: Self-pay

## 2023-01-31 NOTE — Telephone Encounter (Signed)
Called patient for first time chemo follow-up.  Left voicemail for patient to contact office with any questions or concerns.   

## 2023-02-01 ENCOUNTER — Inpatient Hospital Stay: Payer: Medicare Other

## 2023-02-01 ENCOUNTER — Other Ambulatory Visit: Payer: Self-pay

## 2023-02-01 VITALS — BP 88/67 | HR 97 | Temp 98.1°F | Resp 18

## 2023-02-01 DIAGNOSIS — Z5111 Encounter for antineoplastic chemotherapy: Secondary | ICD-10-CM | POA: Diagnosis not present

## 2023-02-01 DIAGNOSIS — C187 Malignant neoplasm of sigmoid colon: Secondary | ICD-10-CM

## 2023-02-01 MED ORDER — SODIUM CHLORIDE 0.9% FLUSH
10.0000 mL | INTRAVENOUS | Status: DC | PRN
  Administered 2023-02-01: 10 mL

## 2023-02-01 MED ORDER — HEPARIN SOD (PORK) LOCK FLUSH 100 UNIT/ML IV SOLN
500.0000 [IU] | Freq: Once | INTRAVENOUS | Status: AC | PRN
  Administered 2023-02-01: 500 [IU]

## 2023-02-01 NOTE — Progress Notes (Unsigned)
FMLA [name] paperwork faxed to [employer] services for patient's wife Tammy. Wife notified. Received fax confirmation.

## 2023-02-01 NOTE — Patient Instructions (Signed)

## 2023-02-05 ENCOUNTER — Encounter (HOSPITAL_COMMUNITY): Payer: Self-pay

## 2023-02-09 ENCOUNTER — Other Ambulatory Visit: Payer: Self-pay | Admitting: Oncology

## 2023-02-12 ENCOUNTER — Encounter: Payer: Self-pay | Admitting: Nurse Practitioner

## 2023-02-12 ENCOUNTER — Inpatient Hospital Stay: Payer: Medicare Other

## 2023-02-12 ENCOUNTER — Encounter: Payer: Self-pay | Admitting: *Deleted

## 2023-02-12 ENCOUNTER — Inpatient Hospital Stay: Payer: Medicare Other | Admitting: Licensed Clinical Social Worker

## 2023-02-12 ENCOUNTER — Inpatient Hospital Stay (HOSPITAL_BASED_OUTPATIENT_CLINIC_OR_DEPARTMENT_OTHER): Payer: Medicare Other | Admitting: Nurse Practitioner

## 2023-02-12 VITALS — BP 106/64 | HR 93 | Temp 98.2°F | Resp 18 | Ht 66.0 in | Wt 211.2 lb

## 2023-02-12 VITALS — BP 105/73 | HR 85

## 2023-02-12 DIAGNOSIS — C187 Malignant neoplasm of sigmoid colon: Secondary | ICD-10-CM

## 2023-02-12 DIAGNOSIS — Z5111 Encounter for antineoplastic chemotherapy: Secondary | ICD-10-CM | POA: Diagnosis not present

## 2023-02-12 LAB — CMP (CANCER CENTER ONLY)
ALT: 45 U/L — ABNORMAL HIGH (ref 0–44)
AST: 43 U/L — ABNORMAL HIGH (ref 15–41)
Albumin: 3.7 g/dL (ref 3.5–5.0)
Alkaline Phosphatase: 272 U/L — ABNORMAL HIGH (ref 38–126)
Anion gap: 8 (ref 5–15)
BUN: 19 mg/dL (ref 6–20)
CO2: 32 mmol/L (ref 22–32)
Calcium: 8.8 mg/dL — ABNORMAL LOW (ref 8.9–10.3)
Chloride: 99 mmol/L (ref 98–111)
Creatinine: 0.88 mg/dL (ref 0.61–1.24)
GFR, Estimated: >60 mL/min (ref >60)
Glucose, Bld: 180 mg/dL — ABNORMAL HIGH (ref 70–99)
Potassium: 3.6 mmol/L (ref 3.5–5.1)
Sodium: 139 mmol/L (ref 135–145)
Total Bilirubin: 0.3 mg/dL (ref 0.3–1.2)
Total Protein: 7.4 g/dL (ref 6.5–8.1)

## 2023-02-12 LAB — CBC WITH DIFFERENTIAL (CANCER CENTER ONLY)
Abs Immature Granulocytes: 0.04 10*3/uL (ref 0.00–0.07)
Basophils Absolute: 0.1 10*3/uL (ref 0.0–0.1)
Basophils Relative: 1 %
Eosinophils Absolute: 0.5 10*3/uL (ref 0.0–0.5)
Eosinophils Relative: 5 %
HCT: 39.8 % (ref 39.0–52.0)
Hemoglobin: 11.8 g/dL — ABNORMAL LOW (ref 13.0–17.0)
Immature Granulocytes: 0 %
Lymphocytes Relative: 23 %
Lymphs Abs: 2.5 10*3/uL (ref 0.7–4.0)
MCH: 22.8 pg — ABNORMAL LOW (ref 26.0–34.0)
MCHC: 29.6 g/dL — ABNORMAL LOW (ref 30.0–36.0)
MCV: 77 fL — ABNORMAL LOW (ref 80.0–100.0)
Monocytes Absolute: 0.8 10*3/uL (ref 0.1–1.0)
Monocytes Relative: 7 %
Neutro Abs: 7 10*3/uL (ref 1.7–7.7)
Neutrophils Relative %: 64 %
Platelet Count: 414 10*3/uL — ABNORMAL HIGH (ref 150–400)
RBC: 5.17 MIL/uL (ref 4.22–5.81)
RDW: 19.9 % — ABNORMAL HIGH (ref 11.5–15.5)
WBC Count: 10.9 10*3/uL — ABNORMAL HIGH (ref 4.0–10.5)
nRBC: 0 % (ref 0.0–0.2)

## 2023-02-12 MED ORDER — SODIUM CHLORIDE 0.9% FLUSH: 10.0000 mL | INTRAVENOUS | Status: DC | PRN

## 2023-02-12 MED ORDER — LEUCOVORIN CALCIUM INJECTION 350 MG
400.0000 mg/m2 | Freq: Once | INTRAVENOUS | Status: AC
  Administered 2023-02-12: 844 mg via INTRAVENOUS
  Filled 2023-02-12: qty 42.2

## 2023-02-12 MED ORDER — OXALIPLATIN CHEMO INJECTION 100 MG/20ML
85.0000 mg/m2 | Freq: Once | INTRAVENOUS | Status: AC
  Administered 2023-02-12: 180 mg via INTRAVENOUS
  Filled 2023-02-12: qty 36

## 2023-02-12 MED ORDER — PALONOSETRON HCL INJECTION 0.25 MG/5ML
0.2500 mg | Freq: Once | INTRAVENOUS | Status: AC
  Administered 2023-02-12: 0.25 mg via INTRAVENOUS
  Filled 2023-02-12: qty 5

## 2023-02-12 MED ORDER — FLUOROURACIL CHEMO INJECTION 2.5 GM/50ML
400.0000 mg/m2 | Freq: Once | INTRAVENOUS | Status: AC
  Administered 2023-02-12: 850 mg via INTRAVENOUS
  Filled 2023-02-12: qty 17

## 2023-02-12 MED ORDER — DOXYCYCLINE HYCLATE 100 MG PO TABS
100.0000 mg | ORAL_TABLET | Freq: Two times a day (BID) | ORAL | 3 refills | Status: DC
Start: 2023-02-26 — End: 2023-07-01

## 2023-02-12 MED ORDER — SODIUM CHLORIDE 0.9 % IV SOLN
2400.0000 mg/m2 | INTRAVENOUS | Status: DC
  Administered 2023-02-12: 5000 mg via INTRAVENOUS
  Filled 2023-02-12: qty 100

## 2023-02-12 MED ORDER — SODIUM CHLORIDE 0.9 % IV SOLN
10.0000 mg | Freq: Once | INTRAVENOUS | Status: AC
  Administered 2023-02-12: 10 mg via INTRAVENOUS
  Filled 2023-02-12: qty 10

## 2023-02-12 MED ORDER — DEXTROSE 5 % IV SOLN: Freq: Once | INTRAVENOUS | Status: AC

## 2023-02-12 NOTE — Progress Notes (Signed)
  [facility] OFFICE PROGRESS NOTE   Diagnosis: Colon cancer  INTERVAL HISTORY:   Mr. Spalla returns as scheduled.  He completed cycle 1 FOLFOX 01/30/2023.  He denies nausea/vomiting.  No mouth sores.  No diarrhea.  He had minimal cold sensitivity lasting 3 to 4 days.  No persistent neuropathy symptoms.  Objective:  Vital signs in last 24 hours:  Blood pressure 106/64, pulse 93, temperature 98.2 F (36.8 C), temperature source Oral, resp. rate 18, height 5\' 6"  (1.676 m), weight 211 lb 3.2 oz (95.8 kg), SpO2 96%.    HEENT: No thrush or ulcers. Resp: Lungs clear bilaterally. Cardio: Regular rate and rhythm. GI: No hepatosplenomegaly. Vascular: No leg edema. Skin: Palms without erythema. Port-A-Cath without erythema.  Lab Results:  Lab Results  Component Value Date   WBC 10.9 (H) 02/12/2023   HGB 11.8 (L) 02/12/2023   HCT 39.8 02/12/2023   MCV 77.0 (L) 02/12/2023   PLT 414 (H) 02/12/2023   NEUTROABS 7.0 02/12/2023    Imaging:  No results found.  Medications: I have reviewed the patient's current medications.  Assessment/Plan: Sigmoid colon mass, multiple liver lesions Ultrasound abdomen 01/08/2023-multifocal hyperechoic liver lesions, largest measuring 8.7 cm in the right hepatic lobe CT abdomen/pelvis 01/08/2023-multiple by lobar hypoattenuating hepatic lesions, masslike thickening of the sigmoid colon with abnormal spiculated pericolonic soft tissue.  IMA lymphadenopathy.  Subcentimeter pancreatic hypodensities without pancreatic duct dilation Biopsy liver lesion 01/22/2023-metastatic adenocarcinoma; foundation 1 microsatellite stable, tumor mutation burden 4, ERBB2 amplification, K-ras wild-type, NRAS wild-type Colonoscopy 01/29/2023-malignant tumor in the sigmoid colon (invasive moderately differentiated adenocarcinoma), four 6-9 mm polyps in the rectum, ascending colon and cecum, one 14 mm polyp in the sigmoid colon (within the rectal polyp biopsies there are 2  similar-appearing fragments showing invasive tumor with associated adenomatous change which is felt to be carryover from the sigmoid mass biopsies, the rectal polyp biopsies also show multiple fragments of tubular adenoma without high-grade dysplasia) Cycle 1 FOLFOX 01/30/2023 Cycle 2 FOLFOX 02/12/2023 Diabetes Cardiomyopathy Hypertension Hypercholesterolemia Weight loss secondary to [facility], metastatic carcinoma? Microcytic anemia secondary to #1  Disposition: Mr. Gondek appears stable.  He has completed 1 cycle of FOLFOX.  He tolerated well.  Plan to proceed with cycle 2 today as scheduled.  We reviewed the foundation 1 results including K-ras wild-type.  We discussed Dr. Kalman Drape recommendation to add Panitumumab with cycle 3.  We reviewed potential side effects.  He is in agreement.  He was provided with printed information on Panitumumab.  Prescription for doxycycline sent to his pharmacy.  CBC and chemistry panel reviewed.  Labs adequate to proceed as above.  He will return for treatment and follow-up as scheduled in 2 weeks.  We are available to see him sooner if needed.    Lonna Cobb ANP/GNP-BC   02/12/2023  11:26 AM

## 2023-02-12 NOTE — Progress Notes (Signed)
Patient seen by Lisa Thomas NP today  Vitals are within treatment parameters.  Labs reviewed by Lisa Thomas NP and are within treatment parameters.  Per physician team, patient is ready for treatment and there are NO modifications to the treatment plan.     

## 2023-02-12 NOTE — Progress Notes (Signed)
[facility] CSW Progress Note  Visual merchandiser met with patient and his wife, Babette Relic, to assess needs.  Discussed Advance Directives.  Tammy is interested in completing hers and patient said he would so the same.  Informed them of CSW availability.  Tammy said she would contact CSW if needed.    Dorothey Baseman, LCSW Clinical Social Worker [facility]

## 2023-02-12 NOTE — Patient Instructions (Addendum)
[place] [facility]   The chemotherapy medication bag should finish at 46 hours, 96 hours, or 7 days. For example, if your pump is scheduled for 46 hours and it was put on at 4:00 p.m., it should finish at 2:00 p.m. the day it is scheduled to come off regardless of your appointment time.     Estimated time to finish at 1:30 Thursday February 14, 2023.   If the display on your pump reads "Low Volume" and it is beeping, take the batteries out of the pump and come to the cancer center for it to be taken off.   If the pump alarms go off prior to the pump reading "Low Volume" then call [phone] and someone can assist you.  If the plunger comes out and the chemotherapy medication is leaking out, please use your home chemo spill kit to clean up the spill. Do NOT use paper towels or other household products.  If you have problems or questions regarding your pump, please call either [phone] (24 hours a day) or the cancer center Monday-Friday 8:00 a.m.- 4:30 p.m. at the clinic number and we will assist you. If you are unable to get assistance, then go to the nearest Emergency Department and ask the staff to contact the IV team for assistance.  Discharge Instructions: Thank you for choosing [facility] to provide your oncology and hematology care.   If you have a lab appointment with the Cancer Center, please go directly to the Cancer Center and check in at the registration area.   Wear comfortable clothing and clothing appropriate for easy access to any Portacath or PICC line.   We strive to give you quality time with your provider. You may need to reschedule your appointment if you arrive late (15 or more minutes).  Arriving late affects you and other patients whose appointments are after yours.  Also, if you miss three or more appointments without notifying the office, you may be dismissed from the clinic at the provider's discretion.      For  prescription refill requests, have your pharmacy contact our office and allow 72 hours for refills to be completed.    Today you received the following chemotherapy and/or immunotherapy agents Oxaliplatin, Leucovorin, Fluorouracil.      To help prevent nausea and vomiting after your treatment, we encourage you to take your nausea medication as directed.  BELOW ARE SYMPTOMS THAT SHOULD BE REPORTED IMMEDIATELY: *FEVER GREATER THAN 100.4 F (38 C) OR HIGHER *CHILLS OR SWEATING *NAUSEA AND VOMITING THAT IS NOT CONTROLLED WITH YOUR NAUSEA MEDICATION *UNUSUAL SHORTNESS OF BREATH *UNUSUAL BRUISING OR BLEEDING *URINARY PROBLEMS (pain or burning when urinating, or frequent urination) *BOWEL PROBLEMS (unusual diarrhea, constipation, pain near the anus) TENDERNESS IN MOUTH AND THROAT WITH OR WITHOUT PRESENCE OF ULCERS (sore throat, sores in mouth, or a toothache) UNUSUAL RASH, SWELLING OR PAIN  UNUSUAL VAGINAL DISCHARGE OR ITCHING   Items with * indicate a potential emergency and should be followed up as soon as possible or go to the Emergency Department if any problems should occur.  Please show the CHEMOTHERAPY ALERT CARD or IMMUNOTHERAPY ALERT CARD at check-in to the Emergency Department and triage nurse.  Should you have questions after your visit or need to cancel or reschedule your appointment, please contact [place] [facility]  Dept: [phone]  and follow the prompts.  Office hours are 8:00 a.m. to 4:30 p.m. Monday - Friday. Please note  that voicemails left after 4:00 p.m. may not be returned until the following business day.  We are closed weekends and major holidays. You have access to a nurse at all times for urgent questions. Please call the main number to the clinic Dept: [phone] and follow the prompts.   For any non-urgent questions, you may also contact your provider using MyChart. We now offer e-Visits for anyone 29 and older to request care online  for non-urgent symptoms. For details visit [email or web address].   Also download the MyChart app! Go to the app store, search "MyChart", open the app, select , and log in with your MyChart username and password.  Oxaliplatin Injection What is this medication? OXALIPLATIN (ox AL i PLA tin) treats colorectal cancer. It works by slowing down the growth of cancer cells. This medicine may be used for other purposes; ask your health care provider or pharmacist if you have questions. COMMON BRAND NAME(S): Eloxatin What should I tell my care team before I take this medication? They need to know if you have any of these conditions: Heart disease History of irregular heartbeat or rhythm Liver disease Low blood cell levels (white cells, red cells, and platelets) Lung or breathing disease, such as asthma Take medications that treat or prevent blood clots Tingling of the fingers, toes, or other nerve disorder An unusual or allergic reaction to oxaliplatin, other medications, foods, dyes, or preservatives If you or your partner are pregnant or trying to get pregnant Breast-feeding How should I use this medication? This medication is injected into a vein. It is given by your care team in a hospital or clinic setting. Talk to your care team about the use of this medication in children. Special care may be needed. Overdosage: If you think you have taken too much of this medicine contact a poison control center or emergency room at once. NOTE: This medicine is only for you. Do not share this medicine with others. What if I miss a dose? Keep appointments for follow-up doses. It is important not to miss a dose. Call your care team if you are unable to keep an appointment. What may interact with this medication? Do not take this medication with any of the following: Cisapride Dronedarone Pimozide Thioridazine This medication may also interact with the following: Aspirin and aspirin-like  medications Certain medications that treat or prevent blood clots, such as warfarin, apixaban, dabigatran, and rivaroxaban Cisplatin Cyclosporine Diuretics Medications for infection, such as acyclovir, adefovir, amphotericin B, bacitracin, cidofovir, foscarnet, ganciclovir, gentamicin, pentamidine, vancomycin NSAIDs, medications for pain and inflammation, such as ibuprofen or naproxen Other medications that cause heart rhythm changes Pamidronate Zoledronic acid This list may not describe all possible interactions. Give your health care provider a list of all the medicines, herbs, non-prescription drugs, or dietary supplements you use. Also tell them if you smoke, drink alcohol, or use illegal drugs. Some items may interact with your medicine. What should I watch for while using this medication? Your condition will be monitored carefully while you are receiving this medication. You may need blood work while taking this medication. This medication may make you feel generally unwell. This is not uncommon as chemotherapy can affect healthy cells as well as cancer cells. Report any side effects. Continue your course of treatment even though you feel ill unless your care team tells you to stop. This medication may increase your risk of getting an infection. Call your care team for advice if you get a fever, chills, sore throat, or  other symptoms of a cold or flu. Do not treat yourself. Try to avoid being around people who are sick. Avoid taking medications that contain aspirin, acetaminophen, ibuprofen, naproxen, or ketoprofen unless instructed by your care team. These medications may hide a fever. Be careful brushing or flossing your teeth or using a toothpick because you may get an infection or bleed more easily. If you have any dental work done, tell your dentist you are receiving this medication. This medication can make you more sensitive to cold. Do not drink cold drinks or use ice. Cover exposed  skin before coming in contact with cold temperatures or cold objects. When out in cold weather wear warm clothing and cover your mouth and nose to warm the air that goes into your lungs. Tell your care team if you get sensitive to the cold. Talk to your care team if you or your partner are pregnant or think either of you might be pregnant. This medication can cause serious birth defects if taken during pregnancy and for 9 months after the last dose. A negative pregnancy test is required before starting this medication. A reliable form of contraception is recommended while taking this medication and for 9 months after the last dose. Talk to your care team about effective forms of contraception. Do not father a child while taking this medication and for 6 months after the last dose. Use a condom while having sex during this time period. Do not breastfeed while taking this medication and for 3 months after the last dose. This medication may cause infertility. Talk to your care team if you are concerned about your fertility. What side effects may I notice from receiving this medication? Side effects that you should report to your care team as soon as possible: Allergic reactions--skin rash, itching, hives, swelling of the face, lips, tongue, or throat Bleeding--bloody or black, tar-like stools, vomiting blood or brown material that looks like coffee grounds, red or dark brown urine, small red or purple spots on skin, unusual bruising or bleeding Dry cough, shortness of breath or trouble breathing Heart rhythm changes--fast or irregular heartbeat, dizziness, feeling faint or lightheaded, chest pain, trouble breathing Infection--fever, chills, cough, sore throat, wounds that don't heal, pain or trouble when passing urine, general feeling of discomfort or being unwell Liver injury--right upper belly pain, loss of appetite, nausea, light-colored stool, dark yellow or brown urine, yellowing skin or eyes, unusual  weakness or fatigue Low red blood cell level--unusual weakness or fatigue, dizziness, headache, trouble breathing Muscle injury--unusual weakness or fatigue, muscle pain, dark yellow or brown urine, decrease in amount of urine Pain, tingling, or numbness in the hands or feet Sudden and severe headache, confusion, change in vision, seizures, which may be signs of posterior reversible encephalopathy syndrome (PRES) Unusual bruising or bleeding Side effects that usually do not require medical attention (report to your care team if they continue or are bothersome): Diarrhea Nausea Pain, redness, or swelling with sores inside the mouth or throat Unusual weakness or fatigue Vomiting This list may not describe all possible side effects. Call your doctor for medical advice about side effects. You may report side effects to FDA at 1-800-FDA-1088. Where should I keep my medication? This medication is given in a hospital or clinic. It will not be stored at home. NOTE: This sheet is a summary. It may not cover all possible information. If you have questions about this medicine, talk to your doctor, pharmacist, or health care provider.  2024 Elsevier/Gold Standard (  2022-04-24 00:00:00) Leucovorin Injection What is this medication? LEUCOVORIN (loo koe VOR in) prevents side effects from certain medications, such as methotrexate. It works by increasing folate levels. This helps protect healthy cells in your body. It may also be used to treat anemia caused by low levels of folate. It can also be used with fluorouracil, a type of chemotherapy, to treat colorectal cancer. It works by increasing the effects of fluorouracil in the body. This medicine may be used for other purposes; ask your health care provider or pharmacist if you have questions. What should I tell my care team before I take this medication? They need to know if you have any of these conditions: Anemia from low levels of vitamin B12 in the  blood An unusual or allergic reaction to leucovorin, folic acid, other medications, foods, dyes, or preservatives Pregnant or trying to get pregnant Breastfeeding How should I use this medication? This medication is injected into a vein or a muscle. It is given by your care team in a hospital or clinic setting. Talk to your care team about the use of this medication in children. Special care may be needed. Overdosage: If you think you have taken too much of this medicine contact a poison control center or emergency room at once. NOTE: This medicine is only for you. Do not share this medicine with others. What if I miss a dose? Keep appointments for follow-up doses. It is important not to miss your dose. Call your care team if you are unable to keep an appointment. What may interact with this medication? Capecitabine Fluorouracil Phenobarbital Phenytoin Primidone Trimethoprim;sulfamethoxazole This list may not describe all possible interactions. Give your health care provider a list of all the medicines, herbs, non-prescription drugs, or dietary supplements you use. Also tell them if you smoke, drink alcohol, or use illegal drugs. Some items may interact with your medicine. What should I watch for while using this medication? Your condition will be monitored carefully while you are receiving this medication. This medication may increase the side effects of 5-fluorouracil. Tell your care team if you have diarrhea or mouth sores that do not get better or that get worse. What side effects may I notice from receiving this medication? Side effects that you should report to your care team as soon as possible: Allergic reactions--skin rash, itching, hives, swelling of the face, lips, tongue, or throat This list may not describe all possible side effects. Call your doctor for medical advice about side effects. You may report side effects to FDA at 1-800-FDA-1088. Where should I keep my  medication? This medication is given in a hospital or clinic. It will not be stored at home. NOTE: This sheet is a summary. It may not cover all possible information. If you have questions about this medicine, talk to your doctor, pharmacist, or health care provider.  2024 Elsevier/Gold Standard (2021-12-12 00:00:00) Fluorouracil Injection What is this medication? FLUOROURACIL (flure oh YOOR a sil) treats some types of cancer. It works by slowing down the growth of cancer cells. This medicine may be used for other purposes; ask your health care provider or pharmacist if you have questions. COMMON BRAND NAME(S): Adrucil What should I tell my care team before I take this medication? They need to know if you have any of these conditions: Blood disorders Dihydropyrimidine dehydrogenase (DPD) deficiency Infection, such as chickenpox, cold sores, herpes Kidney disease Liver disease Poor nutrition Recent or ongoing radiation therapy An unusual or allergic reaction to fluorouracil, other  medications, foods, dyes, or preservatives If you or your partner are pregnant or trying to get pregnant Breast-feeding How should I use this medication? This medication is injected into a vein. It is administered by your care team in a hospital or clinic setting. Talk to your care team about the use of this medication in children. Special care may be needed. Overdosage: If you think you have taken too much of this medicine contact a poison control center or emergency room at once. NOTE: This medicine is only for you. Do not share this medicine with others. What if I miss a dose? Keep appointments for follow-up doses. It is important not to miss your dose. Call your care team if you are unable to keep an appointment. What may interact with this medication? Do not take this medication with any of the following: Live virus vaccines This medication may also interact with the following: Medications that treat or  prevent blood clots, such as warfarin, enoxaparin, dalteparin This list may not describe all possible interactions. Give your health care provider a list of all the medicines, herbs, non-prescription drugs, or dietary supplements you use. Also tell them if you smoke, drink alcohol, or use illegal drugs. Some items may interact with your medicine. What should I watch for while using this medication? Your condition will be monitored carefully while you are receiving this medication. This medication may make you feel generally unwell. This is not uncommon as chemotherapy can affect healthy cells as well as cancer cells. Report any side effects. Continue your course of treatment even though you feel ill unless your care team tells you to stop. In some cases, you may be given additional medications to help with side effects. Follow all directions for their use. This medication may increase your risk of getting an infection. Call your care team for advice if you get a fever, chills, sore throat, or other symptoms of a cold or flu. Do not treat yourself. Try to avoid being around people who are sick. This medication may increase your risk to bruise or bleed. Call your care team if you notice any unusual bleeding. Be careful brushing or flossing your teeth or using a toothpick because you may get an infection or bleed more easily. If you have any dental work done, tell your dentist you are receiving this medication. Avoid taking medications that contain aspirin, acetaminophen, ibuprofen, naproxen, or ketoprofen unless instructed by your care team. These medications may hide a fever. Do not treat diarrhea with over the counter products. Contact your care team if you have diarrhea that lasts more than 2 days or if it is severe and watery. This medication can make you more sensitive to the sun. Keep out of the sun. If you cannot avoid being in the sun, wear protective clothing and sunscreen. Do not use sun lamps,  tanning beds, or tanning booths. Talk to your care team if you or your partner wish to become pregnant or think you might be pregnant. This medication can cause serious birth defects if taken during pregnancy and for 3 months after the last dose. A reliable form of contraception is recommended while taking this medication and for 3 months after the last dose. Talk to your care team about effective forms of contraception. Do not father a child while taking this medication and for 3 months after the last dose. Use a condom while having sex during this time period. Do not breastfeed while taking this medication. This medication may cause  infertility. Talk to your care team if you are concerned about your fertility. What side effects may I notice from receiving this medication? Side effects that you should report to your care team as soon as possible: Allergic reactions--skin rash, itching, hives, swelling of the face, lips, tongue, or throat Heart attack--pain or tightness in the chest, shoulders, arms, or jaw, nausea, shortness of breath, cold or clammy skin, feeling faint or lightheaded Heart failure--shortness of breath, swelling of the ankles, feet, or hands, sudden weight gain, unusual weakness or fatigue Heart rhythm changes--fast or irregular heartbeat, dizziness, feeling faint or lightheaded, chest pain, trouble breathing High ammonia level--unusual weakness or fatigue, confusion, loss of appetite, nausea, vomiting, seizures Infection--fever, chills, cough, sore throat, wounds that don't heal, pain or trouble when passing urine, general feeling of discomfort or being unwell Low red blood cell level--unusual weakness or fatigue, dizziness, headache, trouble breathing Pain, tingling, or numbness in the hands or feet, muscle weakness, change in vision, confusion or trouble speaking, loss of balance or coordination, trouble walking, seizures Redness, swelling, and blistering of the skin over hands and  feet Severe or prolonged diarrhea Unusual bruising or bleeding Side effects that usually do not require medical attention (report to your care team if they continue or are bothersome): Dry skin Headache Increased tears Nausea Pain, redness, or swelling with sores inside the mouth or throat Sensitivity to light Vomiting This list may not describe all possible side effects. Call your doctor for medical advice about side effects. You may report side effects to FDA at 1-800-FDA-1088. Where should I keep my medication? This medication is given in a hospital or clinic. It will not be stored at home. NOTE: This sheet is a summary. It may not cover all possible information. If you have questions about this medicine, talk to your doctor, pharmacist, or health care provider.  2024 Elsevier/Gold Standard (2021-11-14 00:00:00)

## 2023-02-13 ENCOUNTER — Other Ambulatory Visit: Payer: Self-pay

## 2023-02-13 ENCOUNTER — Encounter: Payer: Self-pay | Admitting: *Deleted

## 2023-02-13 NOTE — Progress Notes (Signed)
Updated FMLA for for Mrs. Wallen and faxed to [employer] [phone]. Copy sent to HIM to scan.

## 2023-02-14 ENCOUNTER — Inpatient Hospital Stay: Payer: Medicare Other

## 2023-02-14 VITALS — BP 104/68 | HR 103 | Temp 98.5°F | Resp 18

## 2023-02-14 DIAGNOSIS — Z5111 Encounter for antineoplastic chemotherapy: Secondary | ICD-10-CM | POA: Diagnosis not present

## 2023-02-14 DIAGNOSIS — C187 Malignant neoplasm of sigmoid colon: Secondary | ICD-10-CM

## 2023-02-14 MED ORDER — HEPARIN SOD (PORK) LOCK FLUSH 100 UNIT/ML IV SOLN
500.0000 [IU] | Freq: Once | INTRAVENOUS | Status: AC | PRN
  Administered 2023-02-14: 500 [IU]

## 2023-02-14 MED ORDER — SODIUM CHLORIDE 0.9% FLUSH
10.0000 mL | INTRAVENOUS | Status: DC | PRN
  Administered 2023-02-14: 10 mL

## 2023-02-14 NOTE — Patient Instructions (Signed)

## 2023-02-24 ENCOUNTER — Other Ambulatory Visit: Payer: Self-pay | Admitting: Oncology

## 2023-02-24 DIAGNOSIS — C187 Malignant neoplasm of sigmoid colon: Secondary | ICD-10-CM

## 2023-02-27 ENCOUNTER — Other Ambulatory Visit: Payer: Self-pay | Admitting: Nurse Practitioner

## 2023-02-27 ENCOUNTER — Inpatient Hospital Stay (HOSPITAL_BASED_OUTPATIENT_CLINIC_OR_DEPARTMENT_OTHER): Payer: Medicare Other | Admitting: Nurse Practitioner

## 2023-02-27 ENCOUNTER — Inpatient Hospital Stay: Payer: Medicare Other

## 2023-02-27 ENCOUNTER — Inpatient Hospital Stay: Payer: Medicare Other | Attending: Oncology

## 2023-02-27 ENCOUNTER — Encounter: Payer: Self-pay | Admitting: Nurse Practitioner

## 2023-02-27 VITALS — BP 98/68 | HR 83 | Temp 98.2°F | Resp 18 | Ht 66.0 in | Wt 213.7 lb

## 2023-02-27 VITALS — BP 102/73 | HR 88

## 2023-02-27 DIAGNOSIS — C787 Secondary malignant neoplasm of liver and intrahepatic bile duct: Secondary | ICD-10-CM | POA: Diagnosis not present

## 2023-02-27 DIAGNOSIS — Z5112 Encounter for antineoplastic immunotherapy: Secondary | ICD-10-CM | POA: Diagnosis present

## 2023-02-27 DIAGNOSIS — D509 Iron deficiency anemia, unspecified: Secondary | ICD-10-CM | POA: Insufficient documentation

## 2023-02-27 DIAGNOSIS — Z5111 Encounter for antineoplastic chemotherapy: Secondary | ICD-10-CM | POA: Insufficient documentation

## 2023-02-27 DIAGNOSIS — C187 Malignant neoplasm of sigmoid colon: Secondary | ICD-10-CM

## 2023-02-27 LAB — CBC WITH DIFFERENTIAL (CANCER CENTER ONLY)
Abs Immature Granulocytes: 0.04 10*3/uL (ref 0.00–0.07)
Basophils Absolute: 0.1 10*3/uL (ref 0.0–0.1)
Basophils Relative: 1 %
Eosinophils Absolute: 0.4 10*3/uL (ref 0.0–0.5)
Eosinophils Relative: 4 %
HCT: 40.7 % (ref 39.0–52.0)
Hemoglobin: 12.6 g/dL — ABNORMAL LOW (ref 13.0–17.0)
Immature Granulocytes: 0 %
Lymphocytes Relative: 23 %
Lymphs Abs: 2.4 10*3/uL (ref 0.7–4.0)
MCH: 24.5 pg — ABNORMAL LOW (ref 26.0–34.0)
MCHC: 31 g/dL (ref 30.0–36.0)
MCV: 79 fL — ABNORMAL LOW (ref 80.0–100.0)
Monocytes Absolute: 1.1 10*3/uL — ABNORMAL HIGH (ref 0.1–1.0)
Monocytes Relative: 11 %
Neutro Abs: 6.4 10*3/uL (ref 1.7–7.7)
Neutrophils Relative %: 61 %
Platelet Count: 193 10*3/uL (ref 150–400)
RBC: 5.15 MIL/uL (ref 4.22–5.81)
RDW: 22.9 % — ABNORMAL HIGH (ref 11.5–15.5)
WBC Count: 10.5 10*3/uL (ref 4.0–10.5)
nRBC: 0 % (ref 0.0–0.2)

## 2023-02-27 LAB — CMP (CANCER CENTER ONLY)
ALT: 46 U/L — ABNORMAL HIGH (ref 0–44)
AST: 40 U/L (ref 15–41)
Albumin: 3.8 g/dL (ref 3.5–5.0)
Alkaline Phosphatase: 199 U/L — ABNORMAL HIGH (ref 38–126)
Anion gap: 9 (ref 5–15)
BUN: 18 mg/dL (ref 6–20)
CO2: 29 mmol/L (ref 22–32)
Calcium: 9.1 mg/dL (ref 8.9–10.3)
Chloride: 100 mmol/L (ref 98–111)
Creatinine: 0.82 mg/dL (ref 0.61–1.24)
GFR, Estimated: >60 mL/min (ref >60)
Glucose, Bld: 148 mg/dL — ABNORMAL HIGH (ref 70–99)
Potassium: 3.5 mmol/L (ref 3.5–5.1)
Sodium: 138 mmol/L (ref 135–145)
Total Bilirubin: 0.4 mg/dL (ref 0.3–1.2)
Total Protein: 7.2 g/dL (ref 6.5–8.1)

## 2023-02-27 LAB — CEA (ACCESS): CEA (CHCC): 1830.6 ng/mL — ABNORMAL HIGH (ref 0.00–5.00)

## 2023-02-27 MED ORDER — SODIUM CHLORIDE 0.9 % IV SOLN
2400.0000 mg/m2 | INTRAVENOUS | Status: DC
  Administered 2023-02-27: 5000 mg via INTRAVENOUS
  Filled 2023-02-27: qty 100

## 2023-02-27 MED ORDER — FLUOROURACIL CHEMO INJECTION 2.5 GM/50ML
400.0000 mg/m2 | Freq: Once | INTRAVENOUS | Status: AC
  Administered 2023-02-27: 850 mg via INTRAVENOUS
  Filled 2023-02-27: qty 17

## 2023-02-27 MED ORDER — SODIUM CHLORIDE 0.9 % IV SOLN: 6.0000 mg/kg | Freq: Once | INTRAVENOUS | Status: DC

## 2023-02-27 MED ORDER — SODIUM CHLORIDE 0.9 % IV SOLN: Freq: Once | INTRAVENOUS | Status: AC

## 2023-02-27 MED ORDER — OXALIPLATIN CHEMO INJECTION 100 MG/20ML
85.0000 mg/m2 | Freq: Once | INTRAVENOUS | Status: AC
  Administered 2023-02-27: 180 mg via INTRAVENOUS
  Filled 2023-02-27: qty 36

## 2023-02-27 MED ORDER — LEUCOVORIN CALCIUM INJECTION 350 MG
400.0000 mg/m2 | Freq: Once | INTRAVENOUS | Status: AC
  Administered 2023-02-27: 844 mg via INTRAVENOUS
  Filled 2023-02-27: qty 42.2

## 2023-02-27 MED ORDER — SODIUM CHLORIDE 0.9 % IV SOLN
10.0000 mg | Freq: Once | INTRAVENOUS | Status: AC
  Administered 2023-02-27: 10 mg via INTRAVENOUS
  Filled 2023-02-27: qty 10

## 2023-02-27 MED ORDER — PALONOSETRON HCL INJECTION 0.25 MG/5ML
0.2500 mg | Freq: Once | INTRAVENOUS | Status: AC
  Administered 2023-02-27: 0.25 mg via INTRAVENOUS
  Filled 2023-02-27: qty 5

## 2023-02-27 MED ORDER — DEXTROSE 5 % IV SOLN: Freq: Once | INTRAVENOUS | Status: AC

## 2023-02-27 NOTE — Progress Notes (Signed)
  [facility] OFFICE PROGRESS NOTE   Diagnosis: Colon cancer  INTERVAL HISTORY:   Mr. Sherrard returns as scheduled.  He completed cycle 2 FOLFOX 02/12/2023.  No significant nausea.  No mouth sores.  No diarrhea.  Cold sensitivity lasted 3 to 4 days.  No persistent neuropathy symptoms.  Objective:  Vital signs in last 24 hours:  Blood pressure 98/68, pulse 83, temperature 98.2 F (36.8 C), temperature source Oral, resp. rate 18, height 5\' 6"  (1.676 m), weight 213 lb 11.2 oz (96.9 kg), SpO2 98%.    HEENT: No thrush or ulcers. Resp: Lungs clear bilaterally. Cardio: Regular rate and rhythm. GI: No hepatosplenomegaly. Vascular: No leg edema. Skin: No rash. Port-A-Cath without erythema.  Lab Results:  Lab Results  Component Value Date   WBC 10.5 02/27/2023   HGB 12.6 (L) 02/27/2023   HCT 40.7 02/27/2023   MCV 79.0 (L) 02/27/2023   PLT 193 02/27/2023   NEUTROABS 6.4 02/27/2023    Imaging:  No results found.  Medications: I have reviewed the patient's current medications.  Assessment/Plan: Sigmoid colon mass, multiple liver lesions Ultrasound abdomen 01/08/2023-multifocal hyperechoic liver lesions, largest measuring 8.7 cm in the right hepatic lobe CT abdomen/pelvis 01/08/2023-multiple by lobar hypoattenuating hepatic lesions, masslike thickening of the sigmoid colon with abnormal spiculated pericolonic soft tissue.  IMA lymphadenopathy.  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 Colonoscopy 01/29/2023-malignant tumor in the sigmoid colon (invasive moderately differentiated adenocarcinoma), four 6-9 mm polyps in the rectum, ascending colon and cecum, one 14 mm polyp in the sigmoid colon (within the rectal polyp biopsies there  are 2 similar-appearing fragments showing invasive tumor with associated adenomatous change which is felt to be carryover from the sigmoid mass biopsies, the rectal polyp biopsies also show multiple fragments of tubular adenoma without high-grade dysplasia) Cycle 1 FOLFOX 01/30/2023 Cycle 2 FOLFOX 02/12/2023 Cycle 3 FOLFOX plus Panitumumab 02/27/2023 Diabetes Cardiomyopathy Hypertension Hypercholesterolemia Weight loss secondary to [facility], metastatic carcinoma? Microcytic anemia secondary to #1  Disposition: Daniel [name] appears stable.  He tolerated cycle 2 FOLFOX well.  Plan to proceed with cycle 3 today as scheduled.  Panitumumab to be added beginning today.  We again reviewed potential side effects.  He agrees to proceed.  CBC reviewed and chemistry panel reviewed.  Labs adequate for treatment as scheduled.  He will return for follow-up and cycle 4 in 2 weeks.  We are available to see him sooner if needed.  Patient seen with Dr. Truett Perna.    Lonna Cobb ANP/GNP-BC   02/27/2023  8:37 AM This was a shared visit with Lonna Cobb.  Mr. Bethard has tolerated the first 2 cycles of FOLFOX well.  The tumor returned RASTs wild-type.  The plan is to add panitumumab with this cycle.  We reviewed potential toxicities associated with panitumumab.  He agrees to proceed.  Mancel Bale, MD

## 2023-02-27 NOTE — Progress Notes (Signed)
Patient seen by Lonna Cobb NP today  Vitals are within treatment parameters.  Labs reviewed by Lonna Cobb NP and are within treatment parameters.  Per physician team, patient is ready for treatment. Please note that modifications are being made to the treatment plan including adding panitumumab.

## 2023-02-27 NOTE — Patient Instructions (Addendum)
[facility]   The chemotherapy medication bag should finish at 46 hours, 96 hours, or 7 days. For example, if your pump is scheduled for 46 hours and it was put on at 4:00 p.m., it should finish at 2:00 p.m. the day it is scheduled to come off regardless of your appointment time.     Estimated time to finish at 11:00 Friday March 01, 2023.   If the display on your pump reads "Low Volume" and it is beeping, take the batteries out of the pump and come to the cancer center for it to be taken off.   If the pump alarms go off prior to the pump reading "Low Volume" then call [phone] and someone can assist you.  If the plunger comes out and the chemotherapy medication is leaking out, please use your home chemo spill kit to clean up the spill. Do NOT use paper towels or other household products.  If you have problems or questions regarding your pump, please call either [phone] (24 hours a day) or the cancer center Monday-Friday 8:00 a.m.- 4:30 p.m. at the clinic number and we will assist you. If you are unable to get assistance, then go to the nearest Emergency Department and ask the staff to contact the IV team for assistance.  Discharge Instructions: Thank you for choosing [facility] to provide your oncology and hematology care.   If you have a lab appointment with the Cancer Center, please go directly to the Cancer Center and check in at the registration area.   Wear comfortable clothing and clothing appropriate for easy access to any Portacath or PICC line.   We strive to give you quality time with your provider. You may need to reschedule your appointment if you arrive late (15 or more minutes).  Arriving late affects you and other patients whose appointments are after yours.  Also, if you miss three or more appointments without notifying the office, you may be dismissed from the clinic at the provider's discretion.      For  prescription refill requests, have your pharmacy contact our office and allow 72 hours for refills to be completed.    Today you received the following chemotherapy and/or immunotherapy agents Oxaliplatin, Leucovorin, Florouracil.      To help prevent nausea and vomiting after your treatment, we encourage you to take your nausea medication as directed.  BELOW ARE SYMPTOMS THAT SHOULD BE REPORTED IMMEDIATELY: *FEVER GREATER THAN 100.4 F (38 C) OR HIGHER *CHILLS OR SWEATING *NAUSEA AND VOMITING THAT IS NOT CONTROLLED WITH YOUR NAUSEA MEDICATION *UNUSUAL SHORTNESS OF BREATH *UNUSUAL BRUISING OR BLEEDING *URINARY PROBLEMS (pain or burning when urinating, or frequent urination) *BOWEL PROBLEMS (unusual diarrhea, constipation, pain near the anus) TENDERNESS IN MOUTH AND THROAT WITH OR WITHOUT PRESENCE OF ULCERS (sore throat, sores in mouth, or a toothache) UNUSUAL RASH, SWELLING OR PAIN  UNUSUAL VAGINAL DISCHARGE OR ITCHING   Items with * indicate a potential emergency and should be followed up as soon as possible or go to the Emergency Department if any problems should occur.  Please show the CHEMOTHERAPY ALERT CARD or IMMUNOTHERAPY ALERT CARD at check-in to the Emergency Department and triage nurse.  Should you have questions after your visit or need to cancel or reschedule your appointment, please contact [facility]  Dept: [phone]  and follow the prompts.  Office hours are 8:00 a.m. to 4:30 p.m. Monday - Friday. Please note  that voicemails left after 4:00 p.m. may not be returned until the following business day.  We are closed weekends and major holidays. You have access to a nurse at all times for urgent questions. Please call the main number to the clinic Dept: [phone] and follow the prompts.   For any non-urgent questions, you may also contact your provider using MyChart. We now offer e-Visits for anyone 65 and older to request care online for  non-urgent symptoms. For details visit [email or web address].   Also download the MyChart app! Go to the app store, search "MyChart", open the app, select [facility], and log in with your MyChart username and password.  Oxaliplatin Injection What is this medication? OXALIPLATIN (ox AL i PLA tin) treats colorectal cancer. It works by slowing down the growth of cancer cells. This medicine may be used for other purposes; ask your health care provider or pharmacist if you have questions. COMMON BRAND NAME(S): Eloxatin What should I tell my care team before I take this medication? They need to know if you have any of these conditions: Heart disease History of irregular heartbeat or rhythm Liver disease Low blood cell levels (white cells, red cells, and platelets) Lung or breathing disease, such as asthma Take medications that treat or prevent blood clots Tingling of the fingers, toes, or other nerve disorder An unusual or allergic reaction to oxaliplatin, other medications, foods, dyes, or preservatives If you or your partner are pregnant or trying to get pregnant Breast-feeding How should I use this medication? This medication is injected into a vein. It is given by your care team in a hospital or clinic setting. Talk to your care team about the use of this medication in children. Special care may be needed. Overdosage: If you think you have taken too much of this medicine contact a poison control center or emergency room at once. NOTE: This medicine is only for you. Do not share this medicine with others. What if I miss a dose? Keep appointments for follow-up doses. It is important not to miss a dose. Call your care team if you are unable to keep an appointment. What may interact with this medication? Do not take this medication with any of the following: Cisapride Dronedarone Pimozide Thioridazine This medication may also interact with the following: Aspirin and aspirin-like  medications Certain medications that treat or prevent blood clots, such as warfarin, apixaban, dabigatran, and rivaroxaban Cisplatin Cyclosporine Diuretics Medications for infection, such as acyclovir, adefovir, amphotericin B, bacitracin, cidofovir, foscarnet, ganciclovir, gentamicin, pentamidine, vancomycin NSAIDs, medications for pain and inflammation, such as ibuprofen or naproxen Other medications that cause heart rhythm changes Pamidronate Zoledronic acid This list may not describe all possible interactions. Give your health care provider a list of all the medicines, herbs, non-prescription drugs, or dietary supplements you use. Also tell them if you smoke, drink alcohol, or use illegal drugs. Some items may interact with your medicine. What should I watch for while using this medication? Your condition will be monitored carefully while you are receiving this medication. You may need blood work while taking this medication. This medication may make you feel generally unwell. This is not uncommon as chemotherapy can affect healthy cells as well as cancer cells. Report any side effects. Continue your course of treatment even though you feel ill unless your care team tells you to stop. This medication may increase your risk of getting an infection. Call your care team for advice if you get a fever, chills, sore throat, or  other symptoms of a cold or flu. Do not treat yourself. Try to avoid being around people who are sick. Avoid taking medications that contain aspirin, acetaminophen, ibuprofen, naproxen, or ketoprofen unless instructed by your care team. These medications may hide a fever. Be careful brushing or flossing your teeth or using a toothpick because you may get an infection or bleed more easily. If you have any dental work done, tell your dentist you are receiving this medication. This medication can make you more sensitive to cold. Do not drink cold drinks or use ice. Cover exposed  skin before coming in contact with cold temperatures or cold objects. When out in cold weather wear warm clothing and cover your mouth and nose to warm the air that goes into your lungs. Tell your care team if you get sensitive to the cold. Talk to your care team if you or your partner are pregnant or think either of you might be pregnant. This medication can cause serious birth defects if taken during pregnancy and for 9 months after the last dose. A negative pregnancy test is required before starting this medication. A reliable form of contraception is recommended while taking this medication and for 9 months after the last dose. Talk to your care team about effective forms of contraception. Do not father a child while taking this medication and for 6 months after the last dose. Use a condom while having sex during this time period. Do not breastfeed while taking this medication and for 3 months after the last dose. This medication may cause infertility. Talk to your care team if you are concerned about your fertility. What side effects may I notice from receiving this medication? Side effects that you should report to your care team as soon as possible: Allergic reactions--skin rash, itching, hives, swelling of the face, lips, tongue, or throat Bleeding--bloody or black, tar-like stools, vomiting blood or brown material that looks like coffee grounds, red or dark brown urine, small red or purple spots on skin, unusual bruising or bleeding Dry cough, shortness of breath or trouble breathing Heart rhythm changes--fast or irregular heartbeat, dizziness, feeling faint or lightheaded, chest pain, trouble breathing Infection--fever, chills, cough, sore throat, wounds that don't heal, pain or trouble when passing urine, general feeling of discomfort or being unwell Liver injury--right upper belly pain, loss of appetite, nausea, light-colored stool, dark yellow or brown urine, yellowing skin or eyes, unusual  weakness or fatigue Low red blood cell level--unusual weakness or fatigue, dizziness, headache, trouble breathing Muscle injury--unusual weakness or fatigue, muscle pain, dark yellow or brown urine, decrease in amount of urine Pain, tingling, or numbness in the hands or feet Sudden and severe headache, confusion, change in vision, seizures, which may be signs of posterior reversible encephalopathy syndrome (PRES) Unusual bruising or bleeding Side effects that usually do not require medical attention (report to your care team if they continue or are bothersome): Diarrhea Nausea Pain, redness, or swelling with sores inside the mouth or throat Unusual weakness or fatigue Vomiting This list may not describe all possible side effects. Call your doctor for medical advice about side effects. You may report side effects to FDA at 1-800-FDA-1088. Where should I keep my medication? This medication is given in a hospital or clinic. It will not be stored at home. NOTE: This sheet is a summary. It may not cover all possible information. If you have questions about this medicine, talk to your doctor, pharmacist, or health care provider.  2024 Elsevier/Gold Standard (  2022-04-24 00:00:00) Leucovorin Injection What is this medication? LEUCOVORIN (loo koe VOR in) prevents side effects from certain medications, such as methotrexate. It works by increasing folate levels. This helps protect healthy cells in your body. It may also be used to treat anemia caused by low levels of folate. It can also be used with fluorouracil, a type of chemotherapy, to treat colorectal cancer. It works by increasing the effects of fluorouracil in the body. This medicine may be used for other purposes; ask your health care provider or pharmacist if you have questions. What should I tell my care team before I take this medication? They need to know if you have any of these conditions: Anemia from low levels of vitamin B12 in the  blood An unusual or allergic reaction to leucovorin, folic acid, other medications, foods, dyes, or preservatives Pregnant or trying to get pregnant Breastfeeding How should I use this medication? This medication is injected into a vein or a muscle. It is given by your care team in a hospital or clinic setting. Talk to your care team about the use of this medication in children. Special care may be needed. Overdosage: If you think you have taken too much of this medicine contact a poison control center or emergency room at once. NOTE: This medicine is only for you. Do not share this medicine with others. What if I miss a dose? Keep appointments for follow-up doses. It is important not to miss your dose. Call your care team if you are unable to keep an appointment. What may interact with this medication? Capecitabine Fluorouracil Phenobarbital Phenytoin Primidone Trimethoprim;sulfamethoxazole This list may not describe all possible interactions. Give your health care provider a list of all the medicines, herbs, non-prescription drugs, or dietary supplements you use. Also tell them if you smoke, drink alcohol, or use illegal drugs. Some items may interact with your medicine. What should I watch for while using this medication? Your condition will be monitored carefully while you are receiving this medication. This medication may increase the side effects of 5-fluorouracil. Tell your care team if you have diarrhea or mouth sores that do not get better or that get worse. What side effects may I notice from receiving this medication? Side effects that you should report to your care team as soon as possible: Allergic reactions--skin rash, itching, hives, swelling of the face, lips, tongue, or throat This list may not describe all possible side effects. Call your doctor for medical advice about side effects. You may report side effects to FDA at 1-800-FDA-1088. Where should I keep my  medication? This medication is given in a hospital or clinic. It will not be stored at home. NOTE: This sheet is a summary. It may not cover all possible information. If you have questions about this medicine, talk to your doctor, pharmacist, or health care provider.  2024 Elsevier/Gold Standard (2021-12-12 00:00:00) Fluorouracil Injection What is this medication? FLUOROURACIL (flure oh YOOR a sil) treats some types of cancer. It works by slowing down the growth of cancer cells. This medicine may be used for other purposes; ask your health care provider or pharmacist if you have questions. COMMON BRAND NAME(S): Adrucil What should I tell my care team before I take this medication? They need to know if you have any of these conditions: Blood disorders Dihydropyrimidine dehydrogenase (DPD) deficiency Infection, such as chickenpox, cold sores, herpes Kidney disease Liver disease Poor nutrition Recent or ongoing radiation therapy An unusual or allergic reaction to fluorouracil, other  medications, foods, dyes, or preservatives If you or your partner are pregnant or trying to get pregnant Breast-feeding How should I use this medication? This medication is injected into a vein. It is administered by your care team in a hospital or clinic setting. Talk to your care team about the use of this medication in children. Special care may be needed. Overdosage: If you think you have taken too much of this medicine contact a poison control center or emergency room at once. NOTE: This medicine is only for you. Do not share this medicine with others. What if I miss a dose? Keep appointments for follow-up doses. It is important not to miss your dose. Call your care team if you are unable to keep an appointment. What may interact with this medication? Do not take this medication with any of the following: Live virus vaccines This medication may also interact with the following: Medications that treat or  prevent blood clots, such as warfarin, enoxaparin, dalteparin This list may not describe all possible interactions. Give your health care provider a list of all the medicines, herbs, non-prescription drugs, or dietary supplements you use. Also tell them if you smoke, drink alcohol, or use illegal drugs. Some items may interact with your medicine. What should I watch for while using this medication? Your condition will be monitored carefully while you are receiving this medication. This medication may make you feel generally unwell. This is not uncommon as chemotherapy can affect healthy cells as well as cancer cells. Report any side effects. Continue your course of treatment even though you feel ill unless your care team tells you to stop. In some cases, you may be given additional medications to help with side effects. Follow all directions for their use. This medication may increase your risk of getting an infection. Call your care team for advice if you get a fever, chills, sore throat, or other symptoms of a cold or flu. Do not treat yourself. Try to avoid being around people who are sick. This medication may increase your risk to bruise or bleed. Call your care team if you notice any unusual bleeding. Be careful brushing or flossing your teeth or using a toothpick because you may get an infection or bleed more easily. If you have any dental work done, tell your dentist you are receiving this medication. Avoid taking medications that contain aspirin, acetaminophen, ibuprofen, naproxen, or ketoprofen unless instructed by your care team. These medications may hide a fever. Do not treat diarrhea with over the counter products. Contact your care team if you have diarrhea that lasts more than 2 days or if it is severe and watery. This medication can make you more sensitive to the sun. Keep out of the sun. If you cannot avoid being in the sun, wear protective clothing and sunscreen. Do not use sun lamps,  tanning beds, or tanning booths. Talk to your care team if you or your partner wish to become pregnant or think you might be pregnant. This medication can cause serious birth defects if taken during pregnancy and for 3 months after the last dose. A reliable form of contraception is recommended while taking this medication and for 3 months after the last dose. Talk to your care team about effective forms of contraception. Do not father a child while taking this medication and for 3 months after the last dose. Use a condom while having sex during this time period. Do not breastfeed while taking this medication. This medication may cause  infertility. Talk to your care team if you are concerned about your fertility. What side effects may I notice from receiving this medication? Side effects that you should report to your care team as soon as possible: Allergic reactions--skin rash, itching, hives, swelling of the face, lips, tongue, or throat Heart attack--pain or tightness in the chest, shoulders, arms, or jaw, nausea, shortness of breath, cold or clammy skin, feeling faint or lightheaded Heart failure--shortness of breath, swelling of the ankles, feet, or hands, sudden weight gain, unusual weakness or fatigue Heart rhythm changes--fast or irregular heartbeat, dizziness, feeling faint or lightheaded, chest pain, trouble breathing High ammonia level--unusual weakness or fatigue, confusion, loss of appetite, nausea, vomiting, seizures Infection--fever, chills, cough, sore throat, wounds that don't heal, pain or trouble when passing urine, general feeling of discomfort or being unwell Low red blood cell level--unusual weakness or fatigue, dizziness, headache, trouble breathing Pain, tingling, or numbness in the hands or feet, muscle weakness, change in vision, confusion or trouble speaking, loss of balance or coordination, trouble walking, seizures Redness, swelling, and blistering of the skin over hands and  feet Severe or prolonged diarrhea Unusual bruising or bleeding Side effects that usually do not require medical attention (report to your care team if they continue or are bothersome): Dry skin Headache Increased tears Nausea Pain, redness, or swelling with sores inside the mouth or throat Sensitivity to light Vomiting This list may not describe all possible side effects. Call your doctor for medical advice about side effects. You may report side effects to FDA at 1-800-FDA-1088. Where should I keep my medication? This medication is given in a hospital or clinic. It will not be stored at home. NOTE: This sheet is a summary. It may not cover all possible information. If you have questions about this medicine, talk to your doctor, pharmacist, or health care provider.  2024 Elsevier/Gold Standard (2021-11-14 00:00:00)

## 2023-03-01 ENCOUNTER — Inpatient Hospital Stay: Payer: Medicare Other

## 2023-03-01 ENCOUNTER — Other Ambulatory Visit: Payer: Self-pay

## 2023-03-01 VITALS — BP 100/75 | HR 91 | Temp 98.2°F | Resp 18

## 2023-03-01 DIAGNOSIS — C187 Malignant neoplasm of sigmoid colon: Secondary | ICD-10-CM

## 2023-03-01 DIAGNOSIS — Z5112 Encounter for antineoplastic immunotherapy: Secondary | ICD-10-CM | POA: Diagnosis not present

## 2023-03-01 MED ORDER — SODIUM CHLORIDE 0.9 % IV SOLN: Freq: Once | INTRAVENOUS | Status: AC

## 2023-03-01 MED ORDER — SODIUM CHLORIDE 0.9% FLUSH
10.0000 mL | INTRAVENOUS | Status: DC | PRN
  Administered 2023-03-01: 10 mL

## 2023-03-01 MED ORDER — SODIUM CHLORIDE 0.9 % IV SOLN
6.0000 mg/kg | Freq: Once | INTRAVENOUS | Status: AC
  Administered 2023-03-01: 600 mg via INTRAVENOUS
  Filled 2023-03-01: qty 20

## 2023-03-01 MED ORDER — HEPARIN SOD (PORK) LOCK FLUSH 100 UNIT/ML IV SOLN
500.0000 [IU] | Freq: Once | INTRAVENOUS | Status: AC | PRN
  Administered 2023-03-01: 500 [IU]

## 2023-03-01 NOTE — Patient Instructions (Addendum)
[facility]   Discharge Instructions: Thank you for choosing [facility] to provide your oncology and hematology care.   If you have a lab appointment with the Cancer Center, please go directly to the Cancer Center and check in at the registration area.   Wear comfortable clothing and clothing appropriate for easy access to any Portacath or PICC line.   We strive to give you quality time with your provider. You may need to reschedule your appointment if you arrive late (15 or more minutes).  Arriving late affects you and other patients whose appointments are after yours.  Also, if you miss three or more appointments without notifying the office, you may be dismissed from the clinic at the provider's discretion.      For prescription refill requests, have your pharmacy contact our office and allow 72 hours for refills to be completed.    Today you received the following chemotherapy and/or immunotherapy agents Vectibix      To help prevent nausea and vomiting after your treatment, we encourage you to take your nausea medication as directed.  BELOW ARE SYMPTOMS THAT SHOULD BE REPORTED IMMEDIATELY: *FEVER GREATER THAN 100.4 F (38 C) OR HIGHER *CHILLS OR SWEATING *NAUSEA AND VOMITING THAT IS NOT CONTROLLED WITH YOUR NAUSEA MEDICATION *UNUSUAL SHORTNESS OF BREATH *UNUSUAL BRUISING OR BLEEDING *URINARY PROBLEMS (pain or burning when urinating, or frequent urination) *BOWEL PROBLEMS (unusual diarrhea, constipation, pain near the anus) TENDERNESS IN MOUTH AND THROAT WITH OR WITHOUT PRESENCE OF ULCERS (sore throat, sores in mouth, or a toothache) UNUSUAL RASH, SWELLING OR PAIN  UNUSUAL VAGINAL DISCHARGE OR ITCHING   Items with * indicate a potential emergency and should be followed up as soon as possible or go to the Emergency Department if any problems should occur.  Please show the CHEMOTHERAPY ALERT CARD or IMMUNOTHERAPY ALERT CARD at  check-in to the Emergency Department and triage nurse.  Should you have questions after your visit or need to cancel or reschedule your appointment, please contact [facility]  Dept: [phone]  and follow the prompts.  Office hours are 8:00 a.m. to 4:30 p.m. Monday - Friday. Please note that voicemails left after 4:00 p.m. may not be returned until the following business day.  We are closed weekends and major holidays. You have access to a nurse at all times for urgent questions. Please call the main number to the clinic Dept: [phone] and follow the prompts.   For any non-urgent questions, you may also contact your provider using MyChart. We now offer e-Visits for anyone 42 and older to request care online for non-urgent symptoms. For details visit [email or web address].   Also download the MyChart app! Go to the app store, search "MyChart", open the app, select [facility], and log in with your MyChart username and password.  Panitumumab Injection What is this medication? PANITUMUMAB (pan i TOOM ue mab) treats colorectal cancer. It works by blocking a protein that causes cancer cells to grow and multiply. This helps to slow or stop the spread of cancer cells. It is a monoclonal antibody. This medicine may be used for other purposes; ask your health care provider or pharmacist if you have questions. COMMON BRAND NAME(S): Vectibix What should I tell my care team before I take this medication? They need to know if you have any of these conditions: Eye disease Low levels of magnesium in the blood Lung disease An unusual or allergic reaction  to panitumumab, other medications, foods, dyes, or preservatives Pregnant or trying to get pregnant Breast-feeding How should I use this medication? This medication is injected into a vein. It is given by your care team in a hospital or clinic setting. Talk to your care team about the use of this medication in  children. Special care may be needed. Overdosage: If you think you have taken too much of this medicine contact a poison control center or emergency room at once. NOTE: This medicine is only for you. Do not share this medicine with others. What if I miss a dose? Keep appointments for follow-up doses. It is important not to miss your dose. Call your care team if you are unable to keep an appointment. What may interact with this medication? Bevacizumab This list may not describe all possible interactions. Give your health care provider a list of all the medicines, herbs, non-prescription drugs, or dietary supplements you use. Also tell them if you smoke, drink alcohol, or use illegal drugs. Some items may interact with your medicine. What should I watch for while using this medication? Your condition will be monitored carefully while you are receiving this medication. This medication may make you feel generally unwell. This is not uncommon as chemotherapy can affect healthy cells as well as cancer cells. Report any side effects. Continue your course of treatment even though you feel ill unless your care team tells you to stop. This medication can make you more sensitive to the sun. Keep out of the sun while receiving this medication and for 2 months after stopping therapy. If you cannot avoid being in the sun, wear protective clothing and sunscreen. Do not use sun lamps, tanning beds, or tanning booths. Check with your care team if you have severe diarrhea, nausea, and vomiting or if you sweat a lot. The loss of too much body fluid may make it dangerous for you to take this medication. This medication may cause serious skin reactions. They can happen weeks to months after starting the medication. Contact your care team right away if you notice fevers or flu-like symptoms with a rash. The rash may be red or purple and then turn into blisters or peeling of the skin. You may also notice a red rash with  swelling of the face, lips, or lymph nodes in your neck or under your arms. Talk to your care team if you may be pregnant. Serious birth defects can occur if you take this medication during pregnancy and for 2 months after the last dose. Contraception is recommended while taking this medication and for 2 months after the last dose. Your care team can help you find the option that works for you. Do not breastfeed while taking this medication and for 2 months after the last dose. This medication may cause infertility. Talk to your care team if you are concerned about your fertility. What side effects may I notice from receiving this medication? Side effects that you should report to your care team as soon as possible: Allergic reactions--skin rash, itching, hives, swelling of the face, lips, tongue, or throat Dry cough, shortness of breath or trouble breathing Eye pain, redness, irritation, or discharge with blurry or decreased vision Infusion reactions--chest pain, shortness of breath or trouble breathing, feeling faint or lightheaded Low magnesium level--muscle pain or cramps, unusual weakness or fatigue, fast or irregular heartbeat, tremors Low potassium level--muscle pain or cramps, unusual weakness or fatigue, fast or irregular heartbeat, constipation Redness, blistering, peeling, or loosening of   the skin, including inside the mouth Skin reactions on sun-exposed areas Side effects that usually do not require medical attention (report to your care team if they continue or are bothersome): Change in nail shape, thickness, or color Diarrhea Dry skin Fatigue Nausea Vomiting This list may not describe all possible side effects. Call your doctor for medical advice about side effects. You may report side effects to FDA at 1-800-FDA-1088. Where should I keep my medication? This medication is given in a hospital or clinic. It will not be stored at home. NOTE: This sheet is a summary. It may not  cover all possible information. If you have questions about this medicine, talk to your doctor, pharmacist, or health care provider.  2024 [employer]vier/Gold Standard (2021-11-22 00:00:00)

## 2023-03-10 ENCOUNTER — Other Ambulatory Visit: Payer: Self-pay | Admitting: Oncology

## 2023-03-10 DIAGNOSIS — C187 Malignant neoplasm of sigmoid colon: Secondary | ICD-10-CM

## 2023-03-13 ENCOUNTER — Encounter: Payer: Self-pay | Admitting: Oncology

## 2023-03-13 ENCOUNTER — Inpatient Hospital Stay: Payer: Medicare Other

## 2023-03-13 ENCOUNTER — Other Ambulatory Visit (HOSPITAL_BASED_OUTPATIENT_CLINIC_OR_DEPARTMENT_OTHER): Payer: Self-pay

## 2023-03-13 ENCOUNTER — Other Ambulatory Visit: Payer: Self-pay

## 2023-03-13 ENCOUNTER — Inpatient Hospital Stay (HOSPITAL_BASED_OUTPATIENT_CLINIC_OR_DEPARTMENT_OTHER): Payer: Medicare Other | Admitting: Nurse Practitioner

## 2023-03-13 ENCOUNTER — Encounter: Payer: Self-pay | Admitting: Nurse Practitioner

## 2023-03-13 VITALS — BP 103/78 | HR 100 | Temp 98.1°F | Resp 18 | Ht 66.0 in | Wt 209.6 lb

## 2023-03-13 VITALS — BP 109/79 | HR 92 | Resp 18

## 2023-03-13 DIAGNOSIS — C187 Malignant neoplasm of sigmoid colon: Secondary | ICD-10-CM

## 2023-03-13 DIAGNOSIS — Z5112 Encounter for antineoplastic immunotherapy: Secondary | ICD-10-CM | POA: Diagnosis not present

## 2023-03-13 LAB — CMP (CANCER CENTER ONLY)
ALT: 39 U/L (ref 0–44)
AST: 36 U/L (ref 15–41)
Albumin: 4 g/dL (ref 3.5–5.0)
Alkaline Phosphatase: 151 U/L — ABNORMAL HIGH (ref 38–126)
Anion gap: 11 (ref 5–15)
BUN: 16 mg/dL (ref 6–20)
CO2: 28 mmol/L (ref 22–32)
Calcium: 9.4 mg/dL (ref 8.9–10.3)
Chloride: 100 mmol/L (ref 98–111)
Creatinine: 0.7 mg/dL (ref 0.61–1.24)
GFR, Estimated: >60 mL/min (ref >60)
Glucose, Bld: 123 mg/dL — ABNORMAL HIGH (ref 70–99)
Potassium: 3.3 mmol/L — ABNORMAL LOW (ref 3.5–5.1)
Sodium: 139 mmol/L (ref 135–145)
Total Bilirubin: 0.4 mg/dL (ref 0.3–1.2)
Total Protein: 7.5 g/dL (ref 6.5–8.1)

## 2023-03-13 LAB — CBC WITH DIFFERENTIAL (CANCER CENTER ONLY)
Abs Immature Granulocytes: 0.02 10*3/uL (ref 0.00–0.07)
Basophils Absolute: 0.1 10*3/uL (ref 0.0–0.1)
Basophils Relative: 2 %
Eosinophils Absolute: 0.3 10*3/uL (ref 0.0–0.5)
Eosinophils Relative: 4 %
HCT: 47.6 % (ref 39.0–52.0)
Hemoglobin: 14.3 g/dL (ref 13.0–17.0)
Immature Granulocytes: 0 %
Lymphocytes Relative: 29 %
Lymphs Abs: 2.2 10*3/uL (ref 0.7–4.0)
MCH: 24.5 pg — ABNORMAL LOW (ref 26.0–34.0)
MCHC: 30 g/dL (ref 30.0–36.0)
MCV: 81.6 fL (ref 80.0–100.0)
Monocytes Absolute: 0.8 10*3/uL (ref 0.1–1.0)
Monocytes Relative: 10 %
Neutro Abs: 4.3 10*3/uL (ref 1.7–7.7)
Neutrophils Relative %: 55 %
Platelet Count: 139 10*3/uL — ABNORMAL LOW (ref 150–400)
RBC: 5.83 MIL/uL — ABNORMAL HIGH (ref 4.22–5.81)
RDW: 23.9 % — ABNORMAL HIGH (ref 11.5–15.5)
WBC Count: 7.8 10*3/uL (ref 4.0–10.5)
nRBC: 0 % (ref 0.0–0.2)

## 2023-03-13 LAB — CEA (ACCESS): CEA (CHCC): 534.18 ng/mL — ABNORMAL HIGH (ref 0.00–5.00)

## 2023-03-13 LAB — MAGNESIUM: Magnesium: 1.9 mg/dL (ref 1.7–2.4)

## 2023-03-13 MED ORDER — OXALIPLATIN CHEMO INJECTION 100 MG/20ML
85.0000 mg/m2 | Freq: Once | INTRAVENOUS | Status: AC
  Administered 2023-03-13: 180 mg via INTRAVENOUS
  Filled 2023-03-13: qty 36

## 2023-03-13 MED ORDER — FLUOROURACIL CHEMO INJECTION 2.5 GM/50ML
400.0000 mg/m2 | Freq: Once | INTRAVENOUS | Status: AC
  Administered 2023-03-13: 850 mg via INTRAVENOUS
  Filled 2023-03-13: qty 17

## 2023-03-13 MED ORDER — NYSTATIN 100000 UNIT/ML MT SUSP
5.0000 mL | Freq: Four times a day (QID) | OROMUCOSAL | 1 refills | Status: DC | PRN
  Filled 2023-03-13: qty 240, 12d supply, fill #0
  Filled 2023-06-05: qty 240, 12d supply, fill #1

## 2023-03-13 MED ORDER — PALONOSETRON HCL INJECTION 0.25 MG/5ML
0.2500 mg | Freq: Once | INTRAVENOUS | Status: AC
  Administered 2023-03-13: 0.25 mg via INTRAVENOUS
  Filled 2023-03-13: qty 5

## 2023-03-13 MED ORDER — LEUCOVORIN CALCIUM INJECTION 350 MG
400.0000 mg/m2 | Freq: Once | INTRAVENOUS | Status: AC
  Administered 2023-03-13: 844 mg via INTRAVENOUS
  Filled 2023-03-13: qty 42.2

## 2023-03-13 MED ORDER — SODIUM CHLORIDE 0.9 % IV SOLN
6.0000 mg/kg | Freq: Once | INTRAVENOUS | Status: AC
  Administered 2023-03-13: 600 mg via INTRAVENOUS
  Filled 2023-03-13: qty 20

## 2023-03-13 MED ORDER — SODIUM CHLORIDE 0.9 % IV SOLN
10.0000 mg | Freq: Once | INTRAVENOUS | Status: AC
  Administered 2023-03-13: 10 mg via INTRAVENOUS
  Filled 2023-03-13: qty 10

## 2023-03-13 MED ORDER — DEXTROSE 5 % IV SOLN: Freq: Once | INTRAVENOUS | Status: AC

## 2023-03-13 MED ORDER — SODIUM CHLORIDE 0.9 % IV SOLN: Freq: Once | INTRAVENOUS | Status: AC

## 2023-03-13 MED ORDER — SODIUM CHLORIDE 0.9 % IV SOLN
2400.0000 mg/m2 | INTRAVENOUS | Status: DC
  Administered 2023-03-13: 5000 mg via INTRAVENOUS
  Filled 2023-03-13: qty 100

## 2023-03-13 MED ORDER — POTASSIUM CHLORIDE CRYS ER 20 MEQ PO TBCR
20.0000 meq | EXTENDED_RELEASE_TABLET | Freq: Every day | ORAL | 1 refills | Status: DC
Start: 2023-03-13 — End: 2023-05-06

## 2023-03-13 NOTE — Progress Notes (Signed)
Patient seen by Lonna Cobb NP today  Vitals are within treatment parameters.  Labs reviewed by Lonna Cobb NP and are within treatment parameters. K+ 3.3.   Per physician team, patient is ready for treatment and there are NO modifications to the treatment plan.

## 2023-03-13 NOTE — Patient Instructions (Signed)
[facility]   Discharge Instructions: Thank you for choosing [facility] to provide your oncology and hematology care.   If you have a lab appointment with the Cancer Center, please go directly to the Cancer Center and check in at the registration area.   Wear comfortable clothing and clothing appropriate for easy access to any Portacath or PICC line.   We strive to give you quality time with your provider. You may need to reschedule your appointment if you arrive late (15 or more minutes).  Arriving late affects you and other patients whose appointments are after yours.  Also, if you miss three or more appointments without notifying the office, you may be dismissed from the clinic at the provider's discretion.      For prescription refill requests, have your pharmacy contact our office and allow 72 hours for refills to be completed.    Today you received the following chemotherapy and/or immunotherapy agents Panitumumab (VECTIBIX), Oxaliplatin (ELOXATIN), Leucovorin & Flourouracil (ADRUCIL).      To help prevent nausea and vomiting after your treatment, we encourage you to take your nausea medication as directed.  BELOW ARE SYMPTOMS THAT SHOULD BE REPORTED IMMEDIATELY: *FEVER GREATER THAN 100.4 F (38 C) OR HIGHER *CHILLS OR SWEATING *NAUSEA AND VOMITING THAT IS NOT CONTROLLED WITH YOUR NAUSEA MEDICATION *UNUSUAL SHORTNESS OF BREATH *UNUSUAL BRUISING OR BLEEDING *URINARY PROBLEMS (pain or burning when urinating, or frequent urination) *BOWEL PROBLEMS (unusual diarrhea, constipation, pain near the anus) TENDERNESS IN MOUTH AND THROAT WITH OR WITHOUT PRESENCE OF ULCERS (sore throat, sores in mouth, or a toothache) UNUSUAL RASH, SWELLING OR PAIN  UNUSUAL VAGINAL DISCHARGE OR ITCHING   Items with * indicate a potential emergency and should be followed up as soon as possible or go to the Emergency Department if any problems should  occur.  Please show the CHEMOTHERAPY ALERT CARD or IMMUNOTHERAPY ALERT CARD at check-in to the Emergency Department and triage nurse.  Should you have questions after your visit or need to cancel or reschedule your appointment, please contact [facility]  Dept: [phone]  and follow the prompts.  Office hours are 8:00 a.m. to 4:30 p.m. Monday - Friday. Please note that voicemails left after 4:00 p.m. may not be returned until the following business day.  We are closed weekends and major holidays. You have access to a nurse at all times for urgent questions. Please call the main number to the clinic Dept: [phone] and follow the prompts.   For any non-urgent questions, you may also contact your provider using MyChart. We now offer e-Visits for anyone 46 and older to request care online for non-urgent symptoms. For details visit [email or web address].   Also download the MyChart app! Go to the app store, search "MyChart", open the app, select [place], and log in with your MyChart username and password.  Panitumumab Injection What is this medication? PANITUMUMAB (pan i TOOM ue mab) treats colorectal cancer. It works by blocking a protein that causes cancer cells to grow and multiply. This helps to slow or stop the spread of cancer cells. It is a monoclonal antibody. This medicine may be used for other purposes; ask your health care provider or pharmacist if you have questions. COMMON BRAND NAME(S): Vectibix What should I tell my care team before I take this medication? They need to know if you have any of these conditions: Eye disease Low levels of magnesium in the blood  Lung disease An unusual or allergic reaction to panitumumab, other medications, foods, dyes, or preservatives Pregnant or trying to get pregnant Breast-feeding How should I use this medication? This medication is injected into a vein. It is given by your care team in a hospital or  clinic setting. Talk to your care team about the use of this medication in children. Special care may be needed. Overdosage: If you think you have taken too much of this medicine contact a poison control center or emergency room at once. NOTE: This medicine is only for you. Do not share this medicine with others. What if I miss a dose? Keep appointments for follow-up doses. It is important not to miss your dose. Call your care team if you are unable to keep an appointment. What may interact with this medication? Bevacizumab This list may not describe all possible interactions. Give your health care provider a list of all the medicines, herbs, non-prescription drugs, or dietary supplements you use. Also tell them if you smoke, drink alcohol, or use illegal drugs. Some items may interact with your medicine. What should I watch for while using this medication? Your condition will be monitored carefully while you are receiving this medication. This medication may make you feel generally unwell. This is not uncommon as chemotherapy can affect healthy cells as well as cancer cells. Report any side effects. Continue your course of treatment even though you feel ill unless your care team tells you to stop. This medication can make you more sensitive to the sun. Keep out of the sun while receiving this medication and for 2 months after stopping therapy. If you cannot avoid being in the sun, wear protective clothing and sunscreen. Do not use sun lamps, tanning beds, or tanning booths. Check with your care team if you have severe diarrhea, nausea, and vomiting or if you sweat a lot. The loss of too much body fluid may make it dangerous for you to take this medication. This medication may cause serious skin reactions. They can happen weeks to months after starting the medication. Contact your care team right away if you notice fevers or flu-like symptoms with a rash. The rash may be red or purple and then turn  into blisters or peeling of the skin. You may also notice a red rash with swelling of the face, lips, or lymph nodes in your neck or under your arms. Talk to your care team if you may be pregnant. Serious birth defects can occur if you take this medication during pregnancy and for 2 months after the last dose. Contraception is recommended while taking this medication and for 2 months after the last dose. Your care team can help you find the option that works for you. Do not breastfeed while taking this medication and for 2 months after the last dose. This medication may cause infertility. Talk to your care team if you are concerned about your fertility. What side effects may I notice from receiving this medication? Side effects that you should report to your care team as soon as possible: Allergic reactions--skin rash, itching, hives, swelling of the face, lips, tongue, or throat Dry cough, shortness of breath or trouble breathing Eye pain, redness, irritation, or discharge with blurry or decreased vision Infusion reactions--chest pain, shortness of breath or trouble breathing, feeling faint or lightheaded Low magnesium level--muscle pain or cramps, unusual weakness or fatigue, fast or irregular heartbeat, tremors Low potassium level--muscle pain or cramps, unusual weakness or fatigue, fast or irregular heartbeat,  constipation Redness, blistering, peeling, or loosening of the skin, including inside the mouth Skin reactions on sun-exposed areas Side effects that usually do not require medical attention (report to your care team if they continue or are bothersome): Change in nail shape, thickness, or color Diarrhea Dry skin Fatigue Nausea Vomiting This list may not describe all possible side effects. Call your doctor for medical advice about side effects. You may report side effects to FDA at 1-800-FDA-1088. Where should I keep my medication? This medication is given in a hospital or clinic. It  will not be stored at home. NOTE: This sheet is a summary. It may not cover all possible information. If you have questions about this medicine, talk to your doctor, pharmacist, or health care provider.  2024 Elsevier/Gold Standard (2021-11-22 00:00:00)  Oxaliplatin Injection What is this medication? OXALIPLATIN (ox AL i PLA tin) treats colorectal cancer. It works by slowing down the growth of cancer cells. This medicine may be used for other purposes; ask your health care provider or pharmacist if you have questions. COMMON BRAND NAME(S): Eloxatin What should I tell my care team before I take this medication? They need to know if you have any of these conditions: Heart disease History of irregular heartbeat or rhythm Liver disease Low blood cell levels (white cells, red cells, and platelets) Lung or breathing disease, such as asthma Take medications that treat or prevent blood clots Tingling of the fingers, toes, or other nerve disorder An unusual or allergic reaction to oxaliplatin, other medications, foods, dyes, or preservatives If you or your partner are pregnant or trying to get pregnant Breast-feeding How should I use this medication? This medication is injected into a vein. It is given by your care team in a hospital or clinic setting. Talk to your care team about the use of this medication in children. Special care may be needed. Overdosage: If you think you have taken too much of this medicine contact a poison control center or emergency room at once. NOTE: This medicine is only for you. Do not share this medicine with others. What if I miss a dose? Keep appointments for follow-up doses. It is important not to miss a dose. Call your care team if you are unable to keep an appointment. What may interact with this medication? Do not take this medication with any of the following: Cisapride Dronedarone Pimozide Thioridazine This medication may also interact with the  following: Aspirin and aspirin-like medications Certain medications that treat or prevent blood clots, such as warfarin, apixaban, dabigatran, and rivaroxaban Cisplatin Cyclosporine Diuretics Medications for infection, such as acyclovir, adefovir, amphotericin B, bacitracin, cidofovir, foscarnet, ganciclovir, gentamicin, pentamidine, vancomycin NSAIDs, medications for pain and inflammation, such as ibuprofen or naproxen Other medications that cause heart rhythm changes Pamidronate Zoledronic acid This list may not describe all possible interactions. Give your health care provider a list of all the medicines, herbs, non-prescription drugs, or dietary supplements you use. Also tell them if you smoke, drink alcohol, or use illegal drugs. Some items may interact with your medicine. What should I watch for while using this medication? Your condition will be monitored carefully while you are receiving this medication. You may need blood work while taking this medication. This medication may make you feel generally unwell. This is not uncommon as chemotherapy can affect healthy cells as well as cancer cells. Report any side effects. Continue your course of treatment even though you feel ill unless your care team tells you to stop. This medication  may increase your risk of getting an infection. Call your care team for advice if you get a fever, chills, sore throat, or other symptoms of a cold or flu. Do not treat yourself. Try to avoid being around people who are sick. Avoid taking medications that contain aspirin, acetaminophen, ibuprofen, naproxen, or ketoprofen unless instructed by your care team. These medications may hide a fever. Be careful brushing or flossing your teeth or using a toothpick because you may get an infection or bleed more easily. If you have any dental work done, tell your dentist you are receiving this medication. This medication can make you more sensitive to cold. Do not drink  cold drinks or use ice. Cover exposed skin before coming in contact with cold temperatures or cold objects. When out in cold weather wear warm clothing and cover your mouth and nose to warm the air that goes into your lungs. Tell your care team if you get sensitive to the cold. Talk to your care team if you or your partner are pregnant or think either of you might be pregnant. This medication can cause serious birth defects if taken during pregnancy and for 9 months after the last dose. A negative pregnancy test is required before starting this medication. A reliable form of contraception is recommended while taking this medication and for 9 months after the last dose. Talk to your care team about effective forms of contraception. Do not father a child while taking this medication and for 6 months after the last dose. Use a condom while having sex during this time period. Do not breastfeed while taking this medication and for 3 months after the last dose. This medication may cause infertility. Talk to your care team if you are concerned about your fertility. What side effects may I notice from receiving this medication? Side effects that you should report to your care team as soon as possible: Allergic reactions--skin rash, itching, hives, swelling of the face, lips, tongue, or throat Bleeding--bloody or black, tar-like stools, vomiting blood or brown material that looks like coffee grounds, red or dark brown urine, small red or purple spots on skin, unusual bruising or bleeding Dry cough, shortness of breath or trouble breathing Heart rhythm changes--fast or irregular heartbeat, dizziness, feeling faint or lightheaded, chest pain, trouble breathing Infection--fever, chills, cough, sore throat, wounds that don't heal, pain or trouble when passing urine, general feeling of discomfort or being unwell Liver injury--right upper belly pain, loss of appetite, nausea, light-colored stool, dark yellow or brown  urine, yellowing skin or eyes, unusual weakness or fatigue Low red blood cell level--unusual weakness or fatigue, dizziness, headache, trouble breathing Muscle injury--unusual weakness or fatigue, muscle pain, dark yellow or brown urine, decrease in amount of urine Pain, tingling, or numbness in the hands or feet Sudden and severe headache, confusion, change in vision, seizures, which may be signs of posterior reversible encephalopathy syndrome (PRES) Unusual bruising or bleeding Side effects that usually do not require medical attention (report to your care team if they continue or are bothersome): Diarrhea Nausea Pain, redness, or swelling with sores inside the mouth or throat Unusual weakness or fatigue Vomiting This list may not describe all possible side effects. Call your doctor for medical advice about side effects. You may report side effects to FDA at 1-800-FDA-1088. Where should I keep my medication? This medication is given in a hospital or clinic. It will not be stored at home. NOTE: This sheet is a summary. It may not cover  all possible information. If you have questions about this medicine, talk to your doctor, pharmacist, or health care provider.  2024 Elsevier/Gold Standard (2022-04-24 00:00:00)   Leucovorin Injection What is this medication? LEUCOVORIN (loo koe VOR in) prevents side effects from certain medications, such as methotrexate. It works by increasing folate levels. This helps protect healthy cells in your body. It may also be used to treat anemia caused by low levels of folate. It can also be used with fluorouracil, a type of chemotherapy, to treat colorectal cancer. It works by increasing the effects of fluorouracil in the body. This medicine may be used for other purposes; ask your health care provider or pharmacist if you have questions. What should I tell my care team before I take this medication? They need to know if you have any of these conditions: Anemia  from low levels of vitamin B12 in the blood An unusual or allergic reaction to leucovorin, folic acid, other medications, foods, dyes, or preservatives Pregnant or trying to get pregnant Breastfeeding How should I use this medication? This medication is injected into a vein or a muscle. It is given by your care team in a hospital or clinic setting. Talk to your care team about the use of this medication in children. Special care may be needed. Overdosage: If you think you have taken too much of this medicine contact a poison control center or emergency room at once. NOTE: This medicine is only for you. Do not share this medicine with others. What if I miss a dose? Keep appointments for follow-up doses. It is important not to miss your dose. Call your care team if you are unable to keep an appointment. What may interact with this medication? Capecitabine Fluorouracil Phenobarbital Phenytoin Primidone Trimethoprim;sulfamethoxazole This list may not describe all possible interactions. Give your health care provider a list of all the medicines, herbs, non-prescription drugs, or dietary supplements you use. Also tell them if you smoke, drink alcohol, or use illegal drugs. Some items may interact with your medicine. What should I watch for while using this medication? Your condition will be monitored carefully while you are receiving this medication. This medication may increase the side effects of 5-fluorouracil. Tell your care team if you have diarrhea or mouth sores that do not get better or that get worse. What side effects may I notice from receiving this medication? Side effects that you should report to your care team as soon as possible: Allergic reactions--skin rash, itching, hives, swelling of the face, lips, tongue, or throat This list may not describe all possible side effects. Call your doctor for medical advice about side effects. You may report side effects to FDA at  1-800-FDA-1088. Where should I keep my medication? This medication is given in a hospital or clinic. It will not be stored at home. NOTE: This sheet is a summary. It may not cover all possible information. If you have questions about this medicine, talk to your doctor, pharmacist, or health care provider.  2024 Elsevier/Gold Standard (2021-12-12 00:00:00)   Fluorouracil Injection What is this medication? FLUOROURACIL (flure oh YOOR a sil) treats some types of cancer. It works by slowing down the growth of cancer cells. This medicine may be used for other purposes; ask your health care provider or pharmacist if you have questions. COMMON BRAND NAME(S): Adrucil What should I tell my care team before I take this medication? They need to know if you have any of these conditions: Blood disorders Dihydropyrimidine dehydrogenase (DPD)  deficiency Infection, such as chickenpox, cold sores, herpes Kidney disease Liver disease Poor nutrition Recent or ongoing radiation therapy An unusual or allergic reaction to fluorouracil, other medications, foods, dyes, or preservatives If you or your partner are pregnant or trying to get pregnant Breast-feeding How should I use this medication? This medication is injected into a vein. It is administered by your care team in a hospital or clinic setting. Talk to your care team about the use of this medication in children. Special care may be needed. Overdosage: If you think you have taken too much of this medicine contact a poison control center or emergency room at once. NOTE: This medicine is only for you. Do not share this medicine with others. What if I miss a dose? Keep appointments for follow-up doses. It is important not to miss your dose. Call your care team if you are unable to keep an appointment. What may interact with this medication? Do not take this medication with any of the following: Live virus vaccines This medication may also interact  with the following: Medications that treat or prevent blood clots, such as warfarin, enoxaparin, dalteparin This list may not describe all possible interactions. Give your health care provider a list of all the medicines, herbs, non-prescription drugs, or dietary supplements you use. Also tell them if you smoke, drink alcohol, or use illegal drugs. Some items may interact with your medicine. What should I watch for while using this medication? Your condition will be monitored carefully while you are receiving this medication. This medication may make you feel generally unwell. This is not uncommon as chemotherapy can affect healthy cells as well as cancer cells. Report any side effects. Continue your course of treatment even though you feel ill unless your care team tells you to stop. In some cases, you may be given additional medications to help with side effects. Follow all directions for their use. This medication may increase your risk of getting an infection. Call your care team for advice if you get a fever, chills, sore throat, or other symptoms of a cold or flu. Do not treat yourself. Try to avoid being around people who are sick. This medication may increase your risk to bruise or bleed. Call your care team if you notice any unusual bleeding. Be careful brushing or flossing your teeth or using a toothpick because you may get an infection or bleed more easily. If you have any dental work done, tell your dentist you are receiving this medication. Avoid taking medications that contain aspirin, acetaminophen, ibuprofen, naproxen, or ketoprofen unless instructed by your care team. These medications may hide a fever. Do not treat diarrhea with over the counter products. Contact your care team if you have diarrhea that lasts more than 2 days or if it is severe and watery. This medication can make you more sensitive to the sun. Keep out of the sun. If you cannot avoid being in the sun, wear protective  clothing and sunscreen. Do not use sun lamps, tanning beds, or tanning booths. Talk to your care team if you or your partner wish to become pregnant or think you might be pregnant. This medication can cause serious birth defects if taken during pregnancy and for 3 months after the last dose. A reliable form of contraception is recommended while taking this medication and for 3 months after the last dose. Talk to your care team about effective forms of contraception. Do not father a child while taking this medication and for  3 months after the last dose. Use a condom while having sex during this time period. Do not breastfeed while taking this medication. This medication may cause infertility. Talk to your care team if you are concerned about your fertility. What side effects may I notice from receiving this medication? Side effects that you should report to your care team as soon as possible: Allergic reactions--skin rash, itching, hives, swelling of the face, lips, tongue, or throat Heart attack--pain or tightness in the chest, shoulders, arms, or jaw, nausea, shortness of breath, cold or clammy skin, feeling faint or lightheaded Heart failure--shortness of breath, swelling of the ankles, feet, or hands, sudden weight gain, unusual weakness or fatigue Heart rhythm changes--fast or irregular heartbeat, dizziness, feeling faint or lightheaded, chest pain, trouble breathing High ammonia level--unusual weakness or fatigue, confusion, loss of appetite, nausea, vomiting, seizures Infection--fever, chills, cough, sore throat, wounds that don't heal, pain or trouble when passing urine, general feeling of discomfort or being unwell Low red blood cell level--unusual weakness or fatigue, dizziness, headache, trouble breathing Pain, tingling, or numbness in the hands or feet, muscle weakness, change in vision, confusion or trouble speaking, loss of balance or coordination, trouble walking, seizures Redness,  swelling, and blistering of the skin over hands and feet Severe or prolonged diarrhea Unusual bruising or bleeding Side effects that usually do not require medical attention (report to your care team if they continue or are bothersome): Dry skin Headache Increased tears Nausea Pain, redness, or swelling with sores inside the mouth or throat Sensitivity to light Vomiting This list may not describe all possible side effects. Call your doctor for medical advice about side effects. You may report side effects to FDA at 1-800-FDA-1088. Where should I keep my medication? This medication is given in a hospital or clinic. It will not be stored at home. NOTE: This sheet is a summary. It may not cover all possible information. If you have questions about this medicine, talk to your doctor, pharmacist, or health care provider.  2024 [employer]/Gold Standard (2021-11-14 00:00:00)  The chemotherapy medication bag should finish at 46 hours, 96 hours, or 7 days. For example, if your pump is scheduled for 46 hours and it was put on at 4:00 p.m., it should finish at 2:00 p.m. the day it is scheduled to come off regardless of your appointment time.     Estimated time to finish at 11:30 a.m. on Friday 03/15/2023.   If the display on your pump reads "Low Volume" and it is beeping, take the batteries out of the pump and come to the cancer center for it to be taken off.   If the pump alarms go off prior to the pump reading "Low Volume" then call [phone] and someone can assist you.  If the plunger comes out and the chemotherapy medication is leaking out, please use your home chemo spill kit to clean up the spill. Do NOT use paper towels or other household products.  If you have problems or questions regarding your pump, please call either [phone] (24 hours a day) or the cancer center Monday-Friday 8:00 a.m.- 4:30 p.m. at the clinic number and we will assist you. If you are unable to get assistance,  then go to the nearest Emergency Department and ask the staff to contact the IV team for assistance.

## 2023-03-13 NOTE — Progress Notes (Signed)
[facility] OFFICE PROGRESS NOTE   Diagnosis: Colon cancer  INTERVAL HISTORY:   Daniel [name] returns as scheduled.  He completed cycle 3 FOLFOX 02/12/2023, Panitumumab 03/01/2023.  No increase in nausea following chemotherapy.  He has intermittent nausea which he relates to [facility].  He has a sore along the left buccal mucosa in an area that he "bit" 4 days ago.  No diarrhea.  He has developed a rash on his chest, back and right arm, pruritic in some areas.  He notes the upper nasolabial folds are dry and itchy.  Objective:  Vital signs in last 24 hours:  Blood pressure 103/78, pulse 100, temperature 98.1 F (36.7 C), resp. rate 18, height 5\' 6"  (1.676 m), weight 209 lb 9.6 oz (95.1 kg), SpO2 100%.    HEENT: Superficial ulceration left buccal mucosa.  No thrush. Resp: Lungs clear bilaterally. Cardio: Regular rate and rhythm. GI: No hepatosplenomegaly. Vascular: No leg edema. Skin: Acne type rash scattered over the upper chest and back. Upper nasolabial folds have a dry appearance.  Faint erythema at the right upper arm.  Palms without erythema. Port-A-Cath without erythema.  Lab Results:  Lab Results  Component Value Date   WBC 10.5 02/27/2023   HGB 12.6 (L) 02/27/2023   HCT 40.7 02/27/2023   MCV 79.0 (L) 02/27/2023   PLT 193 02/27/2023   NEUTROABS 6.4 02/27/2023    Imaging:  No results found.  Medications: I have reviewed the patient's current medications.  Assessment/Plan: Sigmoid colon mass, multiple liver lesions Ultrasound abdomen 01/08/2023-multifocal hyperechoic liver lesions, largest measuring 8.7 cm in the right hepatic lobe CT abdomen/pelvis 01/08/2023-multiple by lobar hypoattenuating hepatic lesions, masslike thickening of the sigmoid colon with abnormal spiculated pericolonic soft tissue.  IMA lymphadenopathy.  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 Colonoscopy 01/29/2023-malignant tumor in the sigmoid colon (invasive moderately differentiated adenocarcinoma), four 6-9 mm polyps in the rectum, ascending colon and cecum, one 14 mm polyp in the sigmoid colon (within the rectal polyp biopsies there are 2 similar-appearing fragments showing invasive tumor with associated adenomatous change which is felt to be carryover from the sigmoid mass biopsies, the rectal polyp biopsies also show multiple fragments of tubular adenoma without high-grade dysplasia) Cycle 1 FOLFOX 01/30/2023 Cycle 2 FOLFOX 02/12/2023 Cycle 3 FOLFOX plus Panitumumab 02/27/2023 Cycle 4 FOLFOX plus Panitumumab 03/13/2023 Diabetes Cardiomyopathy Hypertension Hypercholesterolemia Weight loss secondary to [facility], metastatic carcinoma? Microcytic anemia secondary to #1  Disposition: Mr. Bowlin appears stable.  He has completed 3 cycles of FOLFOX.  Panitumumab was added with cycle 3.  Plan to proceed with cycle 4 FOLFOX + Panitumumab today as scheduled.  He has developed a skin rash that appears consistent with a Panitumumab skin rash.  He will continue doxycycline.  He is applying lotion frequently.  For small bothersome areas he will try over-the-counter hydrocortisone cream.  He has a single mouth sore in an area of minor trauma.  He understands the mouth sore could be related to chemotherapy.  We are calling a prescription in for Magic mouthwash.  He will call the office if he develops progressive mouth sores.  CBC reviewed.  Counts adequate to proceed as above.  He will return for follow-up and treatment in 2 weeks.  He will contact the office in the interim as outlined above or with any other problems.  Lonna Cobb ANP/GNP-BC   03/13/2023  8:19 AM

## 2023-03-14 ENCOUNTER — Other Ambulatory Visit: Payer: Self-pay

## 2023-03-15 ENCOUNTER — Inpatient Hospital Stay: Payer: Medicare Other

## 2023-03-15 VITALS — BP 106/82 | HR 94 | Temp 97.7°F | Resp 18

## 2023-03-15 DIAGNOSIS — C187 Malignant neoplasm of sigmoid colon: Secondary | ICD-10-CM

## 2023-03-15 DIAGNOSIS — Z5112 Encounter for antineoplastic immunotherapy: Secondary | ICD-10-CM | POA: Diagnosis not present

## 2023-03-15 MED ORDER — HEPARIN SOD (PORK) LOCK FLUSH 100 UNIT/ML IV SOLN
500.0000 [IU] | Freq: Once | INTRAVENOUS | Status: AC | PRN
  Administered 2023-03-15: 500 [IU]

## 2023-03-15 MED ORDER — SODIUM CHLORIDE 0.9% FLUSH
10.0000 mL | INTRAVENOUS | Status: DC | PRN
  Administered 2023-03-15: 10 mL

## 2023-03-15 NOTE — Patient Instructions (Signed)

## 2023-03-24 ENCOUNTER — Other Ambulatory Visit: Payer: Self-pay | Admitting: Oncology

## 2023-03-24 DIAGNOSIS — C187 Malignant neoplasm of sigmoid colon: Secondary | ICD-10-CM

## 2023-03-27 ENCOUNTER — Inpatient Hospital Stay: Payer: Medicare Other

## 2023-03-27 ENCOUNTER — Inpatient Hospital Stay: Payer: Medicare Other | Attending: Oncology

## 2023-03-27 ENCOUNTER — Inpatient Hospital Stay (HOSPITAL_BASED_OUTPATIENT_CLINIC_OR_DEPARTMENT_OTHER): Payer: Medicare Other | Admitting: Oncology

## 2023-03-27 ENCOUNTER — Encounter: Payer: Self-pay | Admitting: *Deleted

## 2023-03-27 VITALS — BP 104/73 | HR 95

## 2023-03-27 DIAGNOSIS — C187 Malignant neoplasm of sigmoid colon: Secondary | ICD-10-CM

## 2023-03-27 DIAGNOSIS — R110 Nausea: Secondary | ICD-10-CM | POA: Diagnosis not present

## 2023-03-27 DIAGNOSIS — K1379 Other lesions of oral mucosa: Secondary | ICD-10-CM | POA: Diagnosis not present

## 2023-03-27 DIAGNOSIS — R21 Rash and other nonspecific skin eruption: Secondary | ICD-10-CM | POA: Insufficient documentation

## 2023-03-27 DIAGNOSIS — Z5111 Encounter for antineoplastic chemotherapy: Secondary | ICD-10-CM | POA: Insufficient documentation

## 2023-03-27 DIAGNOSIS — Z5112 Encounter for antineoplastic immunotherapy: Secondary | ICD-10-CM | POA: Diagnosis present

## 2023-03-27 DIAGNOSIS — C787 Secondary malignant neoplasm of liver and intrahepatic bile duct: Secondary | ICD-10-CM | POA: Diagnosis not present

## 2023-03-27 LAB — CBC WITH DIFFERENTIAL (CANCER CENTER ONLY)
Abs Immature Granulocytes: 0.01 10*3/uL (ref 0.00–0.07)
Basophils Absolute: 0.1 10*3/uL (ref 0.0–0.1)
Basophils Relative: 1 %
Eosinophils Absolute: 0.4 10*3/uL (ref 0.0–0.5)
Eosinophils Relative: 7 %
HCT: 44.6 % (ref 39.0–52.0)
Hemoglobin: 14 g/dL (ref 13.0–17.0)
Immature Granulocytes: 0 %
Lymphocytes Relative: 31 %
Lymphs Abs: 2.1 10*3/uL (ref 0.7–4.0)
MCH: 26.2 pg (ref 26.0–34.0)
MCHC: 31.4 g/dL (ref 30.0–36.0)
MCV: 83.5 fL (ref 80.0–100.0)
Monocytes Absolute: 0.8 10*3/uL (ref 0.1–1.0)
Monocytes Relative: 12 %
Neutro Abs: 3.4 10*3/uL (ref 1.7–7.7)
Neutrophils Relative %: 49 %
Platelet Count: 119 10*3/uL — ABNORMAL LOW (ref 150–400)
RBC: 5.34 MIL/uL (ref 4.22–5.81)
RDW: 24 % — ABNORMAL HIGH (ref 11.5–15.5)
WBC Count: 6.7 10*3/uL (ref 4.0–10.5)
nRBC: 0 % (ref 0.0–0.2)

## 2023-03-27 LAB — CMP (CANCER CENTER ONLY)
ALT: 28 U/L (ref 0–44)
AST: 30 U/L (ref 15–41)
Albumin: 3.8 g/dL (ref 3.5–5.0)
Alkaline Phosphatase: 114 U/L (ref 38–126)
Anion gap: 8 (ref 5–15)
BUN: 11 mg/dL (ref 6–20)
CO2: 29 mmol/L (ref 22–32)
Calcium: 8.7 mg/dL — ABNORMAL LOW (ref 8.9–10.3)
Chloride: 102 mmol/L (ref 98–111)
Creatinine: 0.62 mg/dL (ref 0.61–1.24)
GFR, Estimated: >60 mL/min (ref >60)
Glucose, Bld: 115 mg/dL — ABNORMAL HIGH (ref 70–99)
Potassium: 3.4 mmol/L — ABNORMAL LOW (ref 3.5–5.1)
Sodium: 139 mmol/L (ref 135–145)
Total Bilirubin: 0.4 mg/dL (ref 0.3–1.2)
Total Protein: 7 g/dL (ref 6.5–8.1)

## 2023-03-27 LAB — CEA (ACCESS): CEA (CHCC): 100.45 ng/mL — ABNORMAL HIGH (ref 0.00–5.00)

## 2023-03-27 LAB — MAGNESIUM: Magnesium: 1.9 mg/dL (ref 1.7–2.4)

## 2023-03-27 MED ORDER — FLUOROURACIL CHEMO INJECTION 2.5 GM/50ML
400.0000 mg/m2 | Freq: Once | INTRAVENOUS | Status: AC
  Administered 2023-03-27: 850 mg via INTRAVENOUS
  Filled 2023-03-27: qty 17

## 2023-03-27 MED ORDER — PALONOSETRON HCL INJECTION 0.25 MG/5ML
0.2500 mg | Freq: Once | INTRAVENOUS | Status: AC
  Administered 2023-03-27: 0.25 mg via INTRAVENOUS
  Filled 2023-03-27: qty 5

## 2023-03-27 MED ORDER — SODIUM CHLORIDE 0.9 % IV SOLN
10.0000 mg | Freq: Once | INTRAVENOUS | Status: AC
  Administered 2023-03-27: 10 mg via INTRAVENOUS
  Filled 2023-03-27: qty 10

## 2023-03-27 MED ORDER — SODIUM CHLORIDE 0.9 % IV SOLN
2400.0000 mg/m2 | INTRAVENOUS | Status: DC
  Administered 2023-03-27: 5000 mg via INTRAVENOUS
  Filled 2023-03-27: qty 100

## 2023-03-27 MED ORDER — SODIUM CHLORIDE 0.9 % IV SOLN: Freq: Once | INTRAVENOUS | Status: AC

## 2023-03-27 MED ORDER — LEUCOVORIN CALCIUM INJECTION 350 MG
400.0000 mg/m2 | Freq: Once | INTRAVENOUS | Status: DC
  Filled 2023-03-27: qty 42.2

## 2023-03-27 MED ORDER — DEXTROSE 5 % IV SOLN: Freq: Once | INTRAVENOUS | Status: AC

## 2023-03-27 MED ORDER — LEUCOVORIN CALCIUM INJECTION 350 MG
400.0000 mg/m2 | Freq: Once | INTRAVENOUS | Status: AC
  Administered 2023-03-27: 844 mg via INTRAVENOUS
  Filled 2023-03-27: qty 42.2

## 2023-03-27 MED ORDER — SODIUM CHLORIDE 0.9 % IV SOLN
6.0000 mg/kg | Freq: Once | INTRAVENOUS | Status: AC
  Administered 2023-03-27: 600 mg via INTRAVENOUS
  Filled 2023-03-27: qty 20

## 2023-03-27 MED ORDER — OXALIPLATIN CHEMO INJECTION 100 MG/20ML
85.0000 mg/m2 | Freq: Once | INTRAVENOUS | Status: AC
  Administered 2023-03-27: 180 mg via INTRAVENOUS
  Filled 2023-03-27: qty 36

## 2023-03-27 NOTE — Progress Notes (Signed)
[facility] OFFICE PROGRESS NOTE   Diagnosis: Colon cancer  INTERVAL HISTORY:   Daniel [name] completed another cycle of FOLFOX/panitumumab on 03/13/2023.  He reports mild mouth sores and nausea following chemotherapy.  Cold sensitivity lasted 3 to 4 days.  No neuropathy symptoms at present.  He has a rash over the face, trunk, and extremities.  The rash has improved with an over-the-counter cream.  Objective:  Vital signs in last 24 hours:  Blood pressure 106/74, pulse 99, temperature 98.1 F (36.7 C), temperature source Oral, resp. rate 18, height 5\' 6"  (1.676 m), weight 215 lb (97.5 kg), SpO2 100%.    HEENT: 2 mm healing ulcer at the right buccal mucosa, smaller healing ulcer at the left buccal mucosa, no thrush Resp: Lungs clear bilaterally Cardio: Regular rate and rhythm GI: Nontender, no hepatosplenomegaly Vascular: No leg edema  Skin: Acne type rash over the face, neck, chest, back, and legs.  The rash is most prominent over the chest, back, and neck  Portacath/PICC-without erythema  Lab Results:  Lab Results  Component Value Date   WBC 6.7 03/27/2023   HGB 14.0 03/27/2023   HCT 44.6 03/27/2023   MCV 83.5 03/27/2023   PLT 119 (L) 03/27/2023   NEUTROABS 3.4 03/27/2023    CMP  Lab Results  Component Value Date   NA 139 03/13/2023   K 3.3 (L) 03/13/2023   CL 100 03/13/2023   CO2 28 03/13/2023   GLUCOSE 123 (H) 03/13/2023   BUN 16 03/13/2023   CREATININE 0.70 03/13/2023   CALCIUM 9.4 03/13/2023   PROT 7.5 03/13/2023   ALBUMIN 4.0 03/13/2023   AST 36 03/13/2023   ALT 39 03/13/2023   ALKPHOS 151 (H) 03/13/2023   BILITOT 0.4 03/13/2023   GFRNONAA >60 03/13/2023   GFRAA >60 07/23/2016    Lab Results  Component Value Date   CEA 534.18 (H) 03/13/2023    Lab Results  Component Value Date   INR 1.1 01/22/2023   LABPROT 14.1 01/22/2023    Imaging:  No results found.  Medications: I have reviewed the patient's current  medications.   Assessment/Plan: Sigmoid colon mass, multiple liver lesions Ultrasound abdomen 01/08/2023-multifocal hyperechoic liver lesions, largest measuring 8.7 cm in the right hepatic lobe CT abdomen/pelvis 01/08/2023-multiple by lobar hypoattenuating hepatic lesions, masslike thickening of the sigmoid colon with abnormal spiculated pericolonic soft tissue.  IMA lymphadenopathy.  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 Colonoscopy 01/29/2023-malignant tumor in the sigmoid colon (invasive moderately differentiated adenocarcinoma), four 6-9 mm polyps in the rectum, ascending colon and cecum, one 14 mm polyp in the sigmoid colon (within the rectal polyp biopsies there are 2 similar-appearing fragments showing invasive tumor with associated adenomatous change which is felt to be carryover from the sigmoid mass biopsies, the rectal polyp biopsies also show multiple fragments of tubular adenoma without high-grade dysplasia) Cycle 1 FOLFOX 01/30/2023 Cycle 2 FOLFOX 02/12/2023 Cycle 3 FOLFOX plus Panitumumab 02/27/2023 Cycle 4 FOLFOX plus Panitumumab 03/13/2023 Cycle 5 FOLFOX plus panitumumab 03/27/2023 Diabetes Cardiomyopathy Hypertension Hypercholesterolemia Weight loss secondary to [facility], metastatic carcinoma? Microcytic anemia secondary to #1    Disposition: Daniel [name] has completed 4 cycles of chemotherapy.  He has tolerated the chemotherapy well.  He will complete cycle 5 today.  He has a panitumumab skin rash.  He will continue to use a moisturizer and  over-the-counter hydrocortisone cream as needed.  He will call for progression of the rash or new symptoms.  He will return for an office visit and cycle 6 FOLFOX/panitumumab in 2 weeks.  He will  undergo a restaging CT evaluation after cycle 6.  We will follow-up on the CEA from today.  The CEA was lower when he was here on 03/13/2023.  Thornton Papas, MD  03/27/2023  9:34 AM

## 2023-03-27 NOTE — Progress Notes (Signed)
Patient seen by Dr. Truett Perna today  Vitals are within treatment parameters.  Labs reviewed by Dr. Truett Perna and are within treatment parameters.OK to proceed w/K+ 3.4  Per physician team, patient is ready for treatment and there are NO modifications to the treatment plan.

## 2023-03-27 NOTE — Patient Instructions (Addendum)
[facility]   The chemotherapy medication bag should finish at 46 hours, 96 hours, or 7 days. For example, if your pump is scheduled for 46 hours and it was put on at 4:00 p.m., it should finish at 2:00 p.m. the day it is scheduled to come off regardless of your appointment time.     Estimated time to finish at 12:15 Friday, March 29, 2023.   If the display on your pump reads "Low Volume" and it is beeping, take the batteries out of the pump and come to the cancer center for it to be taken off.   If the pump alarms go off prior to the pump reading "Low Volume" then call [phone] and someone can assist you.  If the plunger comes out and the chemotherapy medication is leaking out, please use your home chemo spill kit to clean up the spill. Do NOT use paper towels or other household products.  If you have problems or questions regarding your pump, please call either [phone] (24 hours a day) or the cancer center Monday-Friday 8:00 a.m.- 4:30 p.m. at the clinic number and we will assist you. If you are unable to get assistance, then go to the nearest Emergency Department and ask the staff to contact the IV team for assistance.  Discharge Instructions: Thank you for choosing [facility] to provide your oncology and hematology care.   If you have a lab appointment with the Cancer Center, please go directly to the Cancer Center and check in at the registration area.   Wear comfortable clothing and clothing appropriate for easy access to any Portacath or PICC line.   We strive to give you quality time with your provider. You may need to reschedule your appointment if you arrive late (15 or more minutes).  Arriving late affects you and other patients whose appointments are after yours.  Also, if you miss three or more appointments without notifying the office, you may be dismissed from the clinic at the provider's discretion.      For  prescription refill requests, have your pharmacy contact our office and allow 72 hours for refills to be completed.    Today you received the following chemotherapy and/or immunotherapy agents Vectibix, Oxaliplatin, Leucovorin, Fluorouracil.      To help prevent nausea and vomiting after your treatment, we encourage you to take your nausea medication as directed.  BELOW ARE SYMPTOMS THAT SHOULD BE REPORTED IMMEDIATELY: *FEVER GREATER THAN 100.4 F (38 C) OR HIGHER *CHILLS OR SWEATING *NAUSEA AND VOMITING THAT IS NOT CONTROLLED WITH YOUR NAUSEA MEDICATION *UNUSUAL SHORTNESS OF BREATH *UNUSUAL BRUISING OR BLEEDING *URINARY PROBLEMS (pain or burning when urinating, or frequent urination) *BOWEL PROBLEMS (unusual diarrhea, constipation, pain near the anus) TENDERNESS IN MOUTH AND THROAT WITH OR WITHOUT PRESENCE OF ULCERS (sore throat, sores in mouth, or a toothache) UNUSUAL RASH, SWELLING OR PAIN  UNUSUAL VAGINAL DISCHARGE OR ITCHING   Items with * indicate a potential emergency and should be followed up as soon as possible or go to the Emergency Department if any problems should occur.  Please show the CHEMOTHERAPY ALERT CARD or IMMUNOTHERAPY ALERT CARD at check-in to the Emergency Department and triage nurse.  Should you have questions after your visit or need to cancel or reschedule your appointment, please contact [facility]  Dept: [phone]  and follow the prompts.  Office hours are 8:00 a.m. to 4:30 p.m. Monday - Friday. Please  note that voicemails left after 4:00 p.m. may not be returned until the following business day.  We are closed weekends and major holidays. You have access to a nurse at all times for urgent questions. Please call the main number to the clinic Dept: [phone] and follow the prompts.   For any non-urgent questions, you may also contact your provider using MyChart. We now offer e-Visits for anyone 40 and older to request care  online for non-urgent symptoms. For details visit [email or web address].   Also download the MyChart app! Go to the app store, search "MyChart", open the app, select [facility], and log in with your MyChart username and password.  Panitumumab Injection What is this medication? PANITUMUMAB (pan i TOOM ue mab) treats colorectal cancer. It works by blocking a protein that causes cancer cells to grow and multiply. This helps to slow or stop the spread of cancer cells. It is a monoclonal antibody. This medicine may be used for other purposes; ask your health care provider or pharmacist if you have questions. COMMON BRAND NAME(S): Vectibix What should I tell my care team before I take this medication? They need to know if you have any of these conditions: Eye disease Low levels of magnesium in the blood Lung disease An unusual or allergic reaction to panitumumab, other medications, foods, dyes, or preservatives Pregnant or trying to get pregnant Breast-feeding How should I use this medication? This medication is injected into a vein. It is given by your care team in a hospital or clinic setting. Talk to your care team about the use of this medication in children. Special care may be needed. Overdosage: If you think you have taken too much of this medicine contact a poison control center or emergency room at once. NOTE: This medicine is only for you. Do not share this medicine with others. What if I miss a dose? Keep appointments for follow-up doses. It is important not to miss your dose. Call your care team if you are unable to keep an appointment. What may interact with this medication? Bevacizumab This list may not describe all possible interactions. Give your health care provider a list of all the medicines, herbs, non-prescription drugs, or dietary supplements you use. Also tell them if you smoke, drink alcohol, or use illegal drugs. Some items may interact with your medicine. What should  I watch for while using this medication? Your condition will be monitored carefully while you are receiving this medication. This medication may make you feel generally unwell. This is not uncommon as chemotherapy can affect healthy cells as well as cancer cells. Report any side effects. Continue your course of treatment even though you feel ill unless your care team tells you to stop. This medication can make you more sensitive to the sun. Keep out of the sun while receiving this medication and for 2 months after stopping therapy. If you cannot avoid being in the sun, wear protective clothing and sunscreen. Do not use sun lamps, tanning beds, or tanning booths. Check with your care team if you have severe diarrhea, nausea, and vomiting or if you sweat a lot. The loss of too much body fluid may make it dangerous for you to take this medication. This medication may cause serious skin reactions. They can happen weeks to months after starting the medication. Contact your care team right away if you notice fevers or flu-like symptoms with a rash. The rash may be red or purple and then turn into blisters or peeling  of the skin. You may also notice a red rash with swelling of the face, lips, or lymph nodes in your neck or under your arms. Talk to your care team if you may be pregnant. Serious birth defects can occur if you take this medication during pregnancy and for 2 months after the last dose. Contraception is recommended while taking this medication and for 2 months after the last dose. Your care team can help you find the option that works for you. Do not breastfeed while taking this medication and for 2 months after the last dose. This medication may cause infertility. Talk to your care team if you are concerned about your fertility. What side effects may I notice from receiving this medication? Side effects that you should report to your care team as soon as possible: Allergic reactions--skin rash,  itching, hives, swelling of the face, lips, tongue, or throat Dry cough, shortness of breath or trouble breathing Eye pain, redness, irritation, or discharge with blurry or decreased vision Infusion reactions--chest pain, shortness of breath or trouble breathing, feeling faint or lightheaded Low magnesium level--muscle pain or cramps, unusual weakness or fatigue, fast or irregular heartbeat, tremors Low potassium level--muscle pain or cramps, unusual weakness or fatigue, fast or irregular heartbeat, constipation Redness, blistering, peeling, or loosening of the skin, including inside the mouth Skin reactions on sun-exposed areas Side effects that usually do not require medical attention (report to your care team if they continue or are bothersome): Change in nail shape, thickness, or color Diarrhea Dry skin Fatigue Nausea Vomiting This list may not describe all possible side effects. Call your doctor for medical advice about side effects. You may report side effects to FDA at 1-800-FDA-1088. Where should I keep my medication? This medication is given in a hospital or clinic. It will not be stored at home. NOTE: This sheet is a summary. It may not cover all possible information. If you have questions about this medicine, talk to your doctor, pharmacist, or health care provider.  2024 Elsevier/Gold Standard (2021-11-22 00:00:00)  Oxaliplatin Injection What is this medication? OXALIPLATIN (ox AL i PLA tin) treats colorectal cancer. It works by slowing down the growth of cancer cells. This medicine may be used for other purposes; ask your health care provider or pharmacist if you have questions. COMMON BRAND NAME(S): Eloxatin What should I tell my care team before I take this medication? They need to know if you have any of these conditions: Heart disease History of irregular heartbeat or rhythm Liver disease Low blood cell levels (white cells, red cells, and platelets) Lung or breathing  disease, such as asthma Take medications that treat or prevent blood clots Tingling of the fingers, toes, or other nerve disorder An unusual or allergic reaction to oxaliplatin, other medications, foods, dyes, or preservatives If you or your partner are pregnant or trying to get pregnant Breast-feeding How should I use this medication? This medication is injected into a vein. It is given by your care team in a hospital or clinic setting. Talk to your care team about the use of this medication in children. Special care may be needed. Overdosage: If you think you have taken too much of this medicine contact a poison control center or emergency room at once. NOTE: This medicine is only for you. Do not share this medicine with others. What if I miss a dose? Keep appointments for follow-up doses. It is important not to miss a dose. Call your care team if you are unable  to keep an appointment. What may interact with this medication? Do not take this medication with any of the following: Cisapride Dronedarone Pimozide Thioridazine This medication may also interact with the following: Aspirin and aspirin-like medications Certain medications that treat or prevent blood clots, such as warfarin, apixaban, dabigatran, and rivaroxaban Cisplatin Cyclosporine Diuretics Medications for infection, such as acyclovir, adefovir, amphotericin B, bacitracin, cidofovir, foscarnet, ganciclovir, gentamicin, pentamidine, vancomycin NSAIDs, medications for pain and inflammation, such as ibuprofen or naproxen Other medications that cause heart rhythm changes Pamidronate Zoledronic acid This list may not describe all possible interactions. Give your health care provider a list of all the medicines, herbs, non-prescription drugs, or dietary supplements you use. Also tell them if you smoke, drink alcohol, or use illegal drugs. Some items may interact with your medicine. What should I watch for while using this  medication? Your condition will be monitored carefully while you are receiving this medication. You may need blood work while taking this medication. This medication may make you feel generally unwell. This is not uncommon as chemotherapy can affect healthy cells as well as cancer cells. Report any side effects. Continue your course of treatment even though you feel ill unless your care team tells you to stop. This medication may increase your risk of getting an infection. Call your care team for advice if you get a fever, chills, sore throat, or other symptoms of a cold or flu. Do not treat yourself. Try to avoid being around people who are sick. Avoid taking medications that contain aspirin, acetaminophen, ibuprofen, naproxen, or ketoprofen unless instructed by your care team. These medications may hide a fever. Be careful brushing or flossing your teeth or using a toothpick because you may get an infection or bleed more easily. If you have any dental work done, tell your dentist you are receiving this medication. This medication can make you more sensitive to cold. Do not drink cold drinks or use ice. Cover exposed skin before coming in contact with cold temperatures or cold objects. When out in cold weather wear warm clothing and cover your mouth and nose to warm the air that goes into your lungs. Tell your care team if you get sensitive to the cold. Talk to your care team if you or your partner are pregnant or think either of you might be pregnant. This medication can cause serious birth defects if taken during pregnancy and for 9 months after the last dose. A negative pregnancy test is required before starting this medication. A reliable form of contraception is recommended while taking this medication and for 9 months after the last dose. Talk to your care team about effective forms of contraception. Do not father a child while taking this medication and for 6 months after the last dose. Use a condom  while having sex during this time period. Do not breastfeed while taking this medication and for 3 months after the last dose. This medication may cause infertility. Talk to your care team if you are concerned about your fertility. What side effects may I notice from receiving this medication? Side effects that you should report to your care team as soon as possible: Allergic reactions--skin rash, itching, hives, swelling of the face, lips, tongue, or throat Bleeding--bloody or black, tar-like stools, vomiting blood or brown material that looks like coffee grounds, red or dark brown urine, small red or purple spots on skin, unusual bruising or bleeding Dry cough, shortness of breath or trouble breathing Heart rhythm changes--fast or irregular  heartbeat, dizziness, feeling faint or lightheaded, chest pain, trouble breathing Infection--fever, chills, cough, sore throat, wounds that don't heal, pain or trouble when passing urine, general feeling of discomfort or being unwell Liver injury--right upper belly pain, loss of appetite, nausea, light-colored stool, dark yellow or brown urine, yellowing skin or eyes, unusual weakness or fatigue Low red blood cell level--unusual weakness or fatigue, dizziness, headache, trouble breathing Muscle injury--unusual weakness or fatigue, muscle pain, dark yellow or brown urine, decrease in amount of urine Pain, tingling, or numbness in the hands or feet Sudden and severe headache, confusion, change in vision, seizures, which may be signs of posterior reversible encephalopathy syndrome (PRES) Unusual bruising or bleeding Side effects that usually do not require medical attention (report to your care team if they continue or are bothersome): Diarrhea Nausea Pain, redness, or swelling with sores inside the mouth or throat Unusual weakness or fatigue Vomiting This list may not describe all possible side effects. Call your doctor for medical advice about side effects.  You may report side effects to FDA at 1-800-FDA-1088. Where should I keep my medication? This medication is given in a hospital or clinic. It will not be stored at home. NOTE: This sheet is a summary. It may not cover all possible information. If you have questions about this medicine, talk to your doctor, pharmacist, or health care provider.  2024 Elsevier/Gold Standard (2022-04-24 00:00:00)  Leucovorin Injection What is this medication? LEUCOVORIN (loo koe VOR in) prevents side effects from certain medications, such as methotrexate. It works by increasing folate levels. This helps protect healthy cells in your body. It may also be used to treat anemia caused by low levels of folate. It can also be used with fluorouracil, a type of chemotherapy, to treat colorectal cancer. It works by increasing the effects of fluorouracil in the body. This medicine may be used for other purposes; ask your health care provider or pharmacist if you have questions. What should I tell my care team before I take this medication? They need to know if you have any of these conditions: Anemia from low levels of vitamin B12 in the blood An unusual or allergic reaction to leucovorin, folic acid, other medications, foods, dyes, or preservatives Pregnant or trying to get pregnant Breastfeeding How should I use this medication? This medication is injected into a vein or a muscle. It is given by your care team in a hospital or clinic setting. Talk to your care team about the use of this medication in children. Special care may be needed. Overdosage: If you think you have taken too much of this medicine contact a poison control center or emergency room at once. NOTE: This medicine is only for you. Do not share this medicine with others. What if I miss a dose? Keep appointments for follow-up doses. It is important not to miss your dose. Call your care team if you are unable to keep an appointment. What may interact with  this medication? Capecitabine Fluorouracil Phenobarbital Phenytoin Primidone Trimethoprim;sulfamethoxazole This list may not describe all possible interactions. Give your health care provider a list of all the medicines, herbs, non-prescription drugs, or dietary supplements you use. Also tell them if you smoke, drink alcohol, or use illegal drugs. Some items may interact with your medicine. What should I watch for while using this medication? Your condition will be monitored carefully while you are receiving this medication. This medication may increase the side effects of 5-fluorouracil. Tell your care team if you have diarrhea  or mouth sores that do not get better or that get worse. What side effects may I notice from receiving this medication? Side effects that you should report to your care team as soon as possible: Allergic reactions--skin rash, itching, hives, swelling of the face, lips, tongue, or throat This list may not describe all possible side effects. Call your doctor for medical advice about side effects. You may report side effects to FDA at 1-800-FDA-1088. Where should I keep my medication? This medication is given in a hospital or clinic. It will not be stored at home. NOTE: This sheet is a summary. It may not cover all possible information. If you have questions about this medicine, talk to your doctor, pharmacist, or health care provider.  2024 Elsevier/Gold Standard (2021-12-12 00:00:00)  Fluorouracil Injection What is this medication? FLUOROURACIL (flure oh YOOR a sil) treats some types of cancer. It works by slowing down the growth of cancer cells. This medicine may be used for other purposes; ask your health care provider or pharmacist if you have questions. COMMON BRAND NAME(S): Adrucil What should I tell my care team before I take this medication? They need to know if you have any of these conditions: Blood disorders Dihydropyrimidine dehydrogenase (DPD)  deficiency Infection, such as chickenpox, cold sores, herpes Kidney disease Liver disease Poor nutrition Recent or ongoing radiation therapy An unusual or allergic reaction to fluorouracil, other medications, foods, dyes, or preservatives If you or your partner are pregnant or trying to get pregnant Breast-feeding How should I use this medication? This medication is injected into a vein. It is administered by your care team in a hospital or clinic setting. Talk to your care team about the use of this medication in children. Special care may be needed. Overdosage: If you think you have taken too much of this medicine contact a poison control center or emergency room at once. NOTE: This medicine is only for you. Do not share this medicine with others. What if I miss a dose? Keep appointments for follow-up doses. It is important not to miss your dose. Call your care team if you are unable to keep an appointment. What may interact with this medication? Do not take this medication with any of the following: Live virus vaccines This medication may also interact with the following: Medications that treat or prevent blood clots, such as warfarin, enoxaparin, dalteparin This list may not describe all possible interactions. Give your health care provider a list of all the medicines, herbs, non-prescription drugs, or dietary supplements you use. Also tell them if you smoke, drink alcohol, or use illegal drugs. Some items may interact with your medicine. What should I watch for while using this medication? Your condition will be monitored carefully while you are receiving this medication. This medication may make you feel generally unwell. This is not uncommon as chemotherapy can affect healthy cells as well as cancer cells. Report any side effects. Continue your course of treatment even though you feel ill unless your care team tells you to stop. In some cases, you may be given additional medications  to help with side effects. Follow all directions for their use. This medication may increase your risk of getting an infection. Call your care team for advice if you get a fever, chills, sore throat, or other symptoms of a cold or flu. Do not treat yourself. Try to avoid being around people who are sick. This medication may increase your risk to bruise or bleed. Call your care team  if you notice any unusual bleeding. Be careful brushing or flossing your teeth or using a toothpick because you may get an infection or bleed more easily. If you have any dental work done, tell your dentist you are receiving this medication. Avoid taking medications that contain aspirin, acetaminophen, ibuprofen, naproxen, or ketoprofen unless instructed by your care team. These medications may hide a fever. Do not treat diarrhea with over the counter products. Contact your care team if you have diarrhea that lasts more than 2 days or if it is severe and watery. This medication can make you more sensitive to the sun. Keep out of the sun. If you cannot avoid being in the sun, wear protective clothing and sunscreen. Do not use sun lamps, tanning beds, or tanning booths. Talk to your care team if you or your partner wish to become pregnant or think you might be pregnant. This medication can cause serious birth defects if taken during pregnancy and for 3 months after the last dose. A reliable form of contraception is recommended while taking this medication and for 3 months after the last dose. Talk to your care team about effective forms of contraception. Do not father a child while taking this medication and for 3 months after the last dose. Use a condom while having sex during this time period. Do not breastfeed while taking this medication. This medication may cause infertility. Talk to your care team if you are concerned about your fertility. What side effects may I notice from receiving this medication? Side effects that you  should report to your care team as soon as possible: Allergic reactions--skin rash, itching, hives, swelling of the face, lips, tongue, or throat Heart attack--pain or tightness in the chest, shoulders, arms, or jaw, nausea, shortness of breath, cold or clammy skin, feeling faint or lightheaded Heart failure--shortness of breath, swelling of the ankles, feet, or hands, sudden weight gain, unusual weakness or fatigue Heart rhythm changes--fast or irregular heartbeat, dizziness, feeling faint or lightheaded, chest pain, trouble breathing High ammonia level--unusual weakness or fatigue, confusion, loss of appetite, nausea, vomiting, seizures Infection--fever, chills, cough, sore throat, wounds that don't heal, pain or trouble when passing urine, general feeling of discomfort or being unwell Low red blood cell level--unusual weakness or fatigue, dizziness, headache, trouble breathing Pain, tingling, or numbness in the hands or feet, muscle weakness, change in vision, confusion or trouble speaking, loss of balance or coordination, trouble walking, seizures Redness, swelling, and blistering of the skin over hands and feet Severe or prolonged diarrhea Unusual bruising or bleeding Side effects that usually do not require medical attention (report to your care team if they continue or are bothersome): Dry skin Headache Increased tears Nausea Pain, redness, or swelling with sores inside the mouth or throat Sensitivity to light Vomiting This list may not describe all possible side effects. Call your doctor for medical advice about side effects. You may report side effects to FDA at 1-800-FDA-1088. Where should I keep my medication? This medication is given in a hospital or clinic. It will not be stored at home. NOTE: This sheet is a summary. It may not cover all possible information. If you have questions about this medicine, talk to your doctor, pharmacist, or health care provider.  2024 [employer]vier/Gold  Standard (2021-11-14 00:00:00)

## 2023-03-29 ENCOUNTER — Inpatient Hospital Stay: Payer: Medicare Other

## 2023-03-29 VITALS — BP 100/82 | HR 100 | Temp 97.9°F | Resp 18

## 2023-03-29 DIAGNOSIS — C187 Malignant neoplasm of sigmoid colon: Secondary | ICD-10-CM

## 2023-03-29 MED ORDER — HEPARIN SOD (PORK) LOCK FLUSH 100 UNIT/ML IV SOLN: 500.0000 [IU] | Freq: Once | INTRAVENOUS | Status: DC | PRN

## 2023-03-29 MED ORDER — SODIUM CHLORIDE 0.9% FLUSH: 10.0000 mL | INTRAVENOUS | Status: DC | PRN

## 2023-03-29 NOTE — Patient Instructions (Signed)

## 2023-04-06 ENCOUNTER — Other Ambulatory Visit: Payer: Self-pay | Admitting: Oncology

## 2023-04-06 DIAGNOSIS — C187 Malignant neoplasm of sigmoid colon: Secondary | ICD-10-CM

## 2023-04-10 ENCOUNTER — Inpatient Hospital Stay: Payer: Medicare Other

## 2023-04-10 ENCOUNTER — Inpatient Hospital Stay (HOSPITAL_BASED_OUTPATIENT_CLINIC_OR_DEPARTMENT_OTHER): Payer: Medicare Other | Admitting: Nurse Practitioner

## 2023-04-10 ENCOUNTER — Encounter: Payer: Self-pay | Admitting: Nurse Practitioner

## 2023-04-10 VITALS — BP 105/78 | HR 100 | Resp 18

## 2023-04-10 VITALS — BP 100/74 | HR 100 | Temp 97.9°F | Resp 18 | Ht 66.0 in | Wt 211.7 lb

## 2023-04-10 DIAGNOSIS — C187 Malignant neoplasm of sigmoid colon: Secondary | ICD-10-CM

## 2023-04-10 DIAGNOSIS — Z5112 Encounter for antineoplastic immunotherapy: Secondary | ICD-10-CM | POA: Diagnosis not present

## 2023-04-10 LAB — CBC WITH DIFFERENTIAL (CANCER CENTER ONLY)
Abs Immature Granulocytes: 0.02 10*3/uL (ref 0.00–0.07)
Basophils Absolute: 0.1 10*3/uL (ref 0.0–0.1)
Basophils Relative: 1 %
Eosinophils Absolute: 0.4 10*3/uL (ref 0.0–0.5)
Eosinophils Relative: 4 %
HCT: 50.2 % (ref 39.0–52.0)
Hemoglobin: 15.8 g/dL (ref 13.0–17.0)
Immature Granulocytes: 0 %
Lymphocytes Relative: 27 %
Lymphs Abs: 2.6 10*3/uL (ref 0.7–4.0)
MCH: 26.9 pg (ref 26.0–34.0)
MCHC: 31.5 g/dL (ref 30.0–36.0)
MCV: 85.4 fL (ref 80.0–100.0)
Monocytes Absolute: 1.2 10*3/uL — ABNORMAL HIGH (ref 0.1–1.0)
Monocytes Relative: 13 %
Neutro Abs: 5.5 10*3/uL (ref 1.7–7.7)
Neutrophils Relative %: 55 %
Platelet Count: 130 10*3/uL — ABNORMAL LOW (ref 150–400)
RBC: 5.88 MIL/uL — ABNORMAL HIGH (ref 4.22–5.81)
RDW: 23.8 % — ABNORMAL HIGH (ref 11.5–15.5)
WBC Count: 9.9 10*3/uL (ref 4.0–10.5)
nRBC: 0 % (ref 0.0–0.2)

## 2023-04-10 LAB — CMP (CANCER CENTER ONLY)
ALT: 28 U/L (ref 0–44)
AST: 34 U/L (ref 15–41)
Albumin: 3.7 g/dL (ref 3.5–5.0)
Alkaline Phosphatase: 134 U/L — ABNORMAL HIGH (ref 38–126)
Anion gap: 10 (ref 5–15)
BUN: 15 mg/dL (ref 6–20)
CO2: 28 mmol/L (ref 22–32)
Calcium: 8.9 mg/dL (ref 8.9–10.3)
Chloride: 101 mmol/L (ref 98–111)
Creatinine: 0.75 mg/dL (ref 0.61–1.24)
GFR, Estimated: >60 mL/min (ref >60)
Glucose, Bld: 105 mg/dL — ABNORMAL HIGH (ref 70–99)
Potassium: 3.4 mmol/L — ABNORMAL LOW (ref 3.5–5.1)
Sodium: 139 mmol/L (ref 135–145)
Total Bilirubin: 0.5 mg/dL (ref 0.3–1.2)
Total Protein: 7.3 g/dL (ref 6.5–8.1)

## 2023-04-10 LAB — MAGNESIUM: Magnesium: 1.7 mg/dL (ref 1.7–2.4)

## 2023-04-10 LAB — CEA (ACCESS): CEA (CHCC): 33.3 ng/mL — ABNORMAL HIGH (ref 0.00–5.00)

## 2023-04-10 MED ORDER — SODIUM CHLORIDE 0.9 % IV SOLN
10.0000 mg | Freq: Once | INTRAVENOUS | Status: AC
  Administered 2023-04-10: 10 mg via INTRAVENOUS
  Filled 2023-04-10: qty 10

## 2023-04-10 MED ORDER — SODIUM CHLORIDE 0.9 % IV SOLN: Freq: Once | INTRAVENOUS | Status: AC

## 2023-04-10 MED ORDER — DEXTROSE 5 % IV SOLN: Freq: Once | INTRAVENOUS | Status: AC

## 2023-04-10 MED ORDER — LEUCOVORIN CALCIUM INJECTION 350 MG
400.0000 mg/m2 | Freq: Once | INTRAVENOUS | Status: AC
  Administered 2023-04-10: 844 mg via INTRAVENOUS
  Filled 2023-04-10: qty 42.2

## 2023-04-10 MED ORDER — OXALIPLATIN CHEMO INJECTION 100 MG/20ML
85.0000 mg/m2 | Freq: Once | INTRAVENOUS | Status: AC
  Administered 2023-04-10: 180 mg via INTRAVENOUS
  Filled 2023-04-10: qty 36

## 2023-04-10 MED ORDER — SODIUM CHLORIDE 0.9 % IV SOLN
6.0000 mg/kg | Freq: Once | INTRAVENOUS | Status: AC
  Administered 2023-04-10: 600 mg via INTRAVENOUS
  Filled 2023-04-10: qty 20

## 2023-04-10 MED ORDER — FLUOROURACIL CHEMO INJECTION 2.5 GM/50ML
400.0000 mg/m2 | Freq: Once | INTRAVENOUS | Status: AC
  Administered 2023-04-10: 850 mg via INTRAVENOUS
  Filled 2023-04-10: qty 17

## 2023-04-10 MED ORDER — PALONOSETRON HCL INJECTION 0.25 MG/5ML
0.2500 mg | Freq: Once | INTRAVENOUS | Status: AC
  Administered 2023-04-10: 0.25 mg via INTRAVENOUS
  Filled 2023-04-10: qty 5

## 2023-04-10 MED ORDER — SODIUM CHLORIDE 0.9 % IV SOLN
2400.0000 mg/m2 | INTRAVENOUS | Status: DC
  Administered 2023-04-10: 5000 mg via INTRAVENOUS
  Filled 2023-04-10: qty 100

## 2023-04-10 NOTE — Progress Notes (Signed)
Patient seen by Lisa Thomas NP today  Vitals are within treatment parameters.  Labs reviewed by Lisa Thomas NP and are within treatment parameters.  Per physician team, patient is ready for treatment and there are NO modifications to the treatment plan.     

## 2023-04-10 NOTE — Progress Notes (Signed)
[facility] OFFICE PROGRESS NOTE   Diagnosis: Colon cancer  INTERVAL HISTORY:   Mr. Monley returns as scheduled.  He completed another cycle of FOLFOX plus Panitumumab 03/27/2023.  He denies nausea/vomiting.  No mouth sores.  No diarrhea.  He notes an acne rash mainly on the chest and back.  On 04/04/2023 he noted significant redness and a burning sensation on his face.  This has improved.  He recalls mowing his lawn the day prior without sunscreen.  He estimates being exposed to sun for about 45 minutes.  It was a sunny day.  No redness or pain on the palms or soles.  He notes on right hand fourth nailbed there is sore.  Cold sensitivity lasted 3 to 4 days.  No persistent neuropathy symptoms.  Objective:  Vital signs in last 24 hours:  Blood pressure 100/74, pulse 100, temperature 97.9 F (36.6 C), temperature source Skin, resp. rate 18, height 5\' 6"  (1.676 m), weight 211 lb 11.2 oz (96 kg), SpO2 100%.    HEENT: No thrush or ulcers. Resp: Lungs clear bilaterally. Cardio: Regular rate and rhythm. GI: No hepatosplenomegaly. Vascular: No leg edema. Skin: Acne type rash upper anterior chest and back.  Face with diffuse erythema.  Right fourth nailbed with a linear break in the skin. Port-A-Cath without erythema.  Lab Results:  Lab Results  Component Value Date   WBC 9.9 04/10/2023   HGB 15.8 04/10/2023   HCT 50.2 04/10/2023   MCV 85.4 04/10/2023   PLT 130 (L) 04/10/2023   NEUTROABS 5.5 04/10/2023    Imaging:  No results found.  Medications: I have reviewed the patient's current medications.  Assessment/Plan: Sigmoid colon mass, multiple liver lesions Ultrasound abdomen 01/08/2023-multifocal hyperechoic liver lesions, largest measuring 8.7 cm in the right hepatic lobe CT abdomen/pelvis 01/08/2023-multiple by lobar hypoattenuating hepatic lesions, masslike thickening of the sigmoid colon with abnormal spiculated pericolonic soft tissue.  IMA lymphadenopathy.   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 Colonoscopy 01/29/2023-malignant tumor in the sigmoid colon (invasive moderately differentiated adenocarcinoma), four 6-9 mm polyps in the rectum, ascending colon and cecum, one 14 mm polyp in the sigmoid colon (within the rectal polyp biopsies there are 2 similar-appearing fragments showing invasive tumor with associated adenomatous change which is felt to be carryover from the sigmoid mass biopsies, the rectal polyp biopsies also show multiple fragments of tubular adenoma without high-grade dysplasia) Cycle 1 FOLFOX 01/30/2023 Cycle 2 FOLFOX 02/12/2023 Cycle 3 FOLFOX plus Panitumumab 02/27/2023 Cycle 4 FOLFOX plus Panitumumab 03/13/2023 Cycle 5 FOLFOX plus panitumumab 03/27/2023 Cycle 6 FOLFOX plus Panitumumab 04/10/2023 Diabetes Cardiomyopathy Hypertension Hypercholesterolemia Weight loss secondary to [facility], metastatic carcinoma? Microcytic anemia secondary to #1  Disposition: Mr. Willadsen appears stable.  He has completed 5 cycles of FOLFOX/Panitumumab.  CEA is better.  Plan to proceed with cycle 6 FOLFOX/panitumumab today as scheduled.  Restaging CTs prior to next office visit.  CBC and chemistry panel reviewed.  Labs adequate to proceed as above.  The acne type rash appears consistent with a Panitumumab rash.  The facial erythema appears consistent with sunburn.  He understands there is increased sensitivity to sun with 5-fluorouracil.  The Panitumumab may also be contributing.  He will continue moisturizing lotions and try to avoid sun exposure.  If out in the sun he will apply sunscreen and use clothing/hat to limit  the exposure as much as possible.  He will return for follow-up and treatment in 2 weeks.  He  will contact the office in the interim with any problems.  Patient seen with Dr. Truett Perna.  Lonna Cobb ANP/GNP-BC   04/10/2023  8:13 AM  This was a shared visit with Lonna Cobb.  Mr. Talley was interviewed and examined.  He has completed 5 cycles of systemic therapy.  The CEA is lower.  The rash over his face is likely related to sun exposure while on 5-FU and panitumumab.  We encouraged him to use sunscreen and avoid heavy sun exposure.  He will undergo a restaging CT evaluation after this cycle of chemotherapy.  Mancel Bale, MD

## 2023-04-10 NOTE — Patient Instructions (Addendum)
[facility]   Discharge Instructions: Thank you for choosing [facility] to provide your oncology and hematology care.   If you have a lab appointment with the Cancer Center, please go directly to the Cancer Center and check in at the registration area.   Wear comfortable clothing and clothing appropriate for easy access to any Portacath or PICC line.   We strive to give you quality time with your provider. You may need to reschedule your appointment if you arrive late (15 or more minutes).  Arriving late affects you and other patients whose appointments are after yours.  Also, if you miss three or more appointments without notifying the office, you may be dismissed from the clinic at the provider's discretion.      For prescription refill requests, have your pharmacy contact our office and allow 72 hours for refills to be completed.    Today you received the following chemotherapy and/or immunotherapy agents Panitumumab (VECTIBIX), Oxaliplatin (ELOXATIN), Leucovorin & Flourouracil (ADRUCIL).       To help prevent nausea and vomiting after your treatment, we encourage you to take your nausea medication as directed.  BELOW ARE SYMPTOMS THAT SHOULD BE REPORTED IMMEDIATELY: *FEVER GREATER THAN 100.4 F (38 C) OR HIGHER *CHILLS OR SWEATING *NAUSEA AND VOMITING THAT IS NOT CONTROLLED WITH YOUR NAUSEA MEDICATION *UNUSUAL SHORTNESS OF BREATH *UNUSUAL BRUISING OR BLEEDING *URINARY PROBLEMS (pain or burning when urinating, or frequent urination) *BOWEL PROBLEMS (unusual diarrhea, constipation, pain near the anus) TENDERNESS IN MOUTH AND THROAT WITH OR WITHOUT PRESENCE OF ULCERS (sore throat, sores in mouth, or a toothache) UNUSUAL RASH, SWELLING OR PAIN  UNUSUAL VAGINAL DISCHARGE OR ITCHING   Items with * indicate a potential emergency and should be followed up as soon as possible or go to the Emergency Department if any problems should  occur.  Please show the CHEMOTHERAPY ALERT CARD or IMMUNOTHERAPY ALERT CARD at check-in to the Emergency Department and triage nurse.  Should you have questions after your visit or need to cancel or reschedule your appointment, please contact [place] [facility]  Dept: [phone]  and follow the prompts.  Office hours are 8:00 a.m. to 4:30 p.m. Monday - Friday. Please note that voicemails left after 4:00 p.m. may not be returned until the following business day.  We are closed weekends and major holidays. You have access to a nurse at all times for urgent questions. Please call the main number to the clinic Dept: [phone] and follow the prompts.   For any non-urgent questions, you may also contact your provider using MyChart. We now offer e-Visits for anyone 45 and older to request care online for non-urgent symptoms. For details visit [email or web address].   Also download the MyChart app! Go to the app store, search "MyChart", open the app, select [place], and log in with your MyChart username and password.  Panitumumab Injection What is this medication? PANITUMUMAB (pan i TOOM ue mab) treats colorectal cancer. It works by blocking a protein that causes cancer cells to grow and multiply. This helps to slow or stop the spread of cancer cells. It is a monoclonal antibody. This medicine may be used for other purposes; ask your health care provider or pharmacist if you have questions. COMMON BRAND NAME(S): Vectibix What should I tell my care team before I take this medication? They need to know if you have any of these conditions: Eye disease Low levels of magnesium in the  blood Lung disease An unusual or allergic reaction to panitumumab, other medications, foods, dyes, or preservatives Pregnant or trying to get pregnant Breast-feeding How should I use this medication? This medication is injected into a vein. It is given by your care team in a hospital or  clinic setting. Talk to your care team about the use of this medication in children. Special care may be needed. Overdosage: If you think you have taken too much of this medicine contact a poison control center or emergency room at once. NOTE: This medicine is only for you. Do not share this medicine with others. What if I miss a dose? Keep appointments for follow-up doses. It is important not to miss your dose. Call your care team if you are unable to keep an appointment. What may interact with this medication? Bevacizumab This list may not describe all possible interactions. Give your health care provider a list of all the medicines, herbs, non-prescription drugs, or dietary supplements you use. Also tell them if you smoke, drink alcohol, or use illegal drugs. Some items may interact with your medicine. What should I watch for while using this medication? Your condition will be monitored carefully while you are receiving this medication. This medication may make you feel generally unwell. This is not uncommon as chemotherapy can affect healthy cells as well as cancer cells. Report any side effects. Continue your course of treatment even though you feel ill unless your care team tells you to stop. This medication can make you more sensitive to the sun. Keep out of the sun while receiving this medication and for 2 months after stopping therapy. If you cannot avoid being in the sun, wear protective clothing and sunscreen. Do not use sun lamps, tanning beds, or tanning booths. Check with your care team if you have severe diarrhea, nausea, and vomiting or if you sweat a lot. The loss of too much body fluid may make it dangerous for you to take this medication. This medication may cause serious skin reactions. They can happen weeks to months after starting the medication. Contact your care team right away if you notice fevers or flu-like symptoms with a rash. The rash may be red or purple and then turn  into blisters or peeling of the skin. You may also notice a red rash with swelling of the face, lips, or lymph nodes in your neck or under your arms. Talk to your care team if you may be pregnant. Serious birth defects can occur if you take this medication during pregnancy and for 2 months after the last dose. Contraception is recommended while taking this medication and for 2 months after the last dose. Your care team can help you find the option that works for you. Do not breastfeed while taking this medication and for 2 months after the last dose. This medication may cause infertility. Talk to your care team if you are concerned about your fertility. What side effects may I notice from receiving this medication? Side effects that you should report to your care team as soon as possible: Allergic reactions--skin rash, itching, hives, swelling of the face, lips, tongue, or throat Dry cough, shortness of breath or trouble breathing Eye pain, redness, irritation, or discharge with blurry or decreased vision Infusion reactions--chest pain, shortness of breath or trouble breathing, feeling faint or lightheaded Low magnesium level--muscle pain or cramps, unusual weakness or fatigue, fast or irregular heartbeat, tremors Low potassium level--muscle pain or cramps, unusual weakness or fatigue, fast or irregular  heartbeat, constipation Redness, blistering, peeling, or loosening of the skin, including inside the mouth Skin reactions on sun-exposed areas Side effects that usually do not require medical attention (report to your care team if they continue or are bothersome): Change in nail shape, thickness, or color Diarrhea Dry skin Fatigue Nausea Vomiting This list may not describe all possible side effects. Call your doctor for medical advice about side effects. You may report side effects to FDA at 1-800-FDA-1088. Where should I keep my medication? This medication is given in a hospital or clinic. It  will not be stored at home. NOTE: This sheet is a summary. It may not cover all possible information. If you have questions about this medicine, talk to your doctor, pharmacist, or health care provider.  2024 Elsevier/Gold Standard (2021-11-22 00:00:00)  Oxaliplatin Injection What is this medication? OXALIPLATIN (ox AL i PLA tin) treats colorectal cancer. It works by slowing down the growth of cancer cells. This medicine may be used for other purposes; ask your health care provider or pharmacist if you have questions. COMMON BRAND NAME(S): Eloxatin What should I tell my care team before I take this medication? They need to know if you have any of these conditions: Heart disease History of irregular heartbeat or rhythm Liver disease Low blood cell levels (white cells, red cells, and platelets) Lung or breathing disease, such as asthma Take medications that treat or prevent blood clots Tingling of the fingers, toes, or other nerve disorder An unusual or allergic reaction to oxaliplatin, other medications, foods, dyes, or preservatives If you or your partner are pregnant or trying to get pregnant Breast-feeding How should I use this medication? This medication is injected into a vein. It is given by your care team in a hospital or clinic setting. Talk to your care team about the use of this medication in children. Special care may be needed. Overdosage: If you think you have taken too much of this medicine contact a poison control center or emergency room at once. NOTE: This medicine is only for you. Do not share this medicine with others. What if I miss a dose? Keep appointments for follow-up doses. It is important not to miss a dose. Call your care team if you are unable to keep an appointment. What may interact with this medication? Do not take this medication with any of the following: Cisapride Dronedarone Pimozide Thioridazine This medication may also interact with the  following: Aspirin and aspirin-like medications Certain medications that treat or prevent blood clots, such as warfarin, apixaban, dabigatran, and rivaroxaban Cisplatin Cyclosporine Diuretics Medications for infection, such as acyclovir, adefovir, amphotericin B, bacitracin, cidofovir, foscarnet, ganciclovir, gentamicin, pentamidine, vancomycin NSAIDs, medications for pain and inflammation, such as ibuprofen or naproxen Other medications that cause heart rhythm changes Pamidronate Zoledronic acid This list may not describe all possible interactions. Give your health care provider a list of all the medicines, herbs, non-prescription drugs, or dietary supplements you use. Also tell them if you smoke, drink alcohol, or use illegal drugs. Some items may interact with your medicine. What should I watch for while using this medication? Your condition will be monitored carefully while you are receiving this medication. You may need blood work while taking this medication. This medication may make you feel generally unwell. This is not uncommon as chemotherapy can affect healthy cells as well as cancer cells. Report any side effects. Continue your course of treatment even though you feel ill unless your care team tells you to stop. This  medication may increase your risk of getting an infection. Call your care team for advice if you get a fever, chills, sore throat, or other symptoms of a cold or flu. Do not treat yourself. Try to avoid being around people who are sick. Avoid taking medications that contain aspirin, acetaminophen, ibuprofen, naproxen, or ketoprofen unless instructed by your care team. These medications may hide a fever. Be careful brushing or flossing your teeth or using a toothpick because you may get an infection or bleed more easily. If you have any dental work done, tell your dentist you are receiving this medication. This medication can make you more sensitive to cold. Do not drink  cold drinks or use ice. Cover exposed skin before coming in contact with cold temperatures or cold objects. When out in cold weather wear warm clothing and cover your mouth and nose to warm the air that goes into your lungs. Tell your care team if you get sensitive to the cold. Talk to your care team if you or your partner are pregnant or think either of you might be pregnant. This medication can cause serious birth defects if taken during pregnancy and for 9 months after the last dose. A negative pregnancy test is required before starting this medication. A reliable form of contraception is recommended while taking this medication and for 9 months after the last dose. Talk to your care team about effective forms of contraception. Do not father a child while taking this medication and for 6 months after the last dose. Use a condom while having sex during this time period. Do not breastfeed while taking this medication and for 3 months after the last dose. This medication may cause infertility. Talk to your care team if you are concerned about your fertility. What side effects may I notice from receiving this medication? Side effects that you should report to your care team as soon as possible: Allergic reactions--skin rash, itching, hives, swelling of the face, lips, tongue, or throat Bleeding--bloody or black, tar-like stools, vomiting blood or brown material that looks like coffee grounds, red or dark brown urine, small red or purple spots on skin, unusual bruising or bleeding Dry cough, shortness of breath or trouble breathing Heart rhythm changes--fast or irregular heartbeat, dizziness, feeling faint or lightheaded, chest pain, trouble breathing Infection--fever, chills, cough, sore throat, wounds that don't heal, pain or trouble when passing urine, general feeling of discomfort or being unwell Liver injury--right upper belly pain, loss of appetite, nausea, light-colored stool, dark yellow or brown  urine, yellowing skin or eyes, unusual weakness or fatigue Low red blood cell level--unusual weakness or fatigue, dizziness, headache, trouble breathing Muscle injury--unusual weakness or fatigue, muscle pain, dark yellow or brown urine, decrease in amount of urine Pain, tingling, or numbness in the hands or feet Sudden and severe headache, confusion, change in vision, seizures, which may be signs of posterior reversible encephalopathy syndrome (PRES) Unusual bruising or bleeding Side effects that usually do not require medical attention (report to your care team if they continue or are bothersome): Diarrhea Nausea Pain, redness, or swelling with sores inside the mouth or throat Unusual weakness or fatigue Vomiting This list may not describe all possible side effects. Call your doctor for medical advice about side effects. You may report side effects to FDA at 1-800-FDA-1088. Where should I keep my medication? This medication is given in a hospital or clinic. It will not be stored at home. NOTE: This sheet is a summary. It may not  cover all possible information. If you have questions about this medicine, talk to your doctor, pharmacist, or health care provider.  2024 Elsevier/Gold Standard (2022-04-24 00:00:00)   Leucovorin Injection What is this medication? LEUCOVORIN (loo koe VOR in) prevents side effects from certain medications, such as methotrexate. It works by increasing folate levels. This helps protect healthy cells in your body. It may also be used to treat anemia caused by low levels of folate. It can also be used with fluorouracil, a type of chemotherapy, to treat colorectal cancer. It works by increasing the effects of fluorouracil in the body. This medicine may be used for other purposes; ask your health care provider or pharmacist if you have questions. What should I tell my care team before I take this medication? They need to know if you have any of these conditions: Anemia  from low levels of vitamin B12 in the blood An unusual or allergic reaction to leucovorin, folic acid, other medications, foods, dyes, or preservatives Pregnant or trying to get pregnant Breastfeeding How should I use this medication? This medication is injected into a vein or a muscle. It is given by your care team in a hospital or clinic setting. Talk to your care team about the use of this medication in children. Special care may be needed. Overdosage: If you think you have taken too much of this medicine contact a poison control center or emergency room at once. NOTE: This medicine is only for you. Do not share this medicine with others. What if I miss a dose? Keep appointments for follow-up doses. It is important not to miss your dose. Call your care team if you are unable to keep an appointment. What may interact with this medication? Capecitabine Fluorouracil Phenobarbital Phenytoin Primidone Trimethoprim;sulfamethoxazole This list may not describe all possible interactions. Give your health care provider a list of all the medicines, herbs, non-prescription drugs, or dietary supplements you use. Also tell them if you smoke, drink alcohol, or use illegal drugs. Some items may interact with your medicine. What should I watch for while using this medication? Your condition will be monitored carefully while you are receiving this medication. This medication may increase the side effects of 5-fluorouracil. Tell your care team if you have diarrhea or mouth sores that do not get better or that get worse. What side effects may I notice from receiving this medication? Side effects that you should report to your care team as soon as possible: Allergic reactions--skin rash, itching, hives, swelling of the face, lips, tongue, or throat This list may not describe all possible side effects. Call your doctor for medical advice about side effects. You may report side effects to FDA at  1-800-FDA-1088. Where should I keep my medication? This medication is given in a hospital or clinic. It will not be stored at home. NOTE: This sheet is a summary. It may not cover all possible information. If you have questions about this medicine, talk to your doctor, pharmacist, or health care provider.  2024 Elsevier/Gold Standard (2021-12-12 00:00:00)  Fluorouracil Injection What is this medication? FLUOROURACIL (flure oh YOOR a sil) treats some types of cancer. It works by slowing down the growth of cancer cells. This medicine may be used for other purposes; ask your health care provider or pharmacist if you have questions. COMMON BRAND NAME(S): Adrucil What should I tell my care team before I take this medication? They need to know if you have any of these conditions: Blood disorders Dihydropyrimidine dehydrogenase (DPD)  deficiency Infection, such as chickenpox, cold sores, herpes Kidney disease Liver disease Poor nutrition Recent or ongoing radiation therapy An unusual or allergic reaction to fluorouracil, other medications, foods, dyes, or preservatives If you or your partner are pregnant or trying to get pregnant Breast-feeding How should I use this medication? This medication is injected into a vein. It is administered by your care team in a hospital or clinic setting. Talk to your care team about the use of this medication in children. Special care may be needed. Overdosage: If you think you have taken too much of this medicine contact a poison control center or emergency room at once. NOTE: This medicine is only for you. Do not share this medicine with others. What if I miss a dose? Keep appointments for follow-up doses. It is important not to miss your dose. Call your care team if you are unable to keep an appointment. What may interact with this medication? Do not take this medication with any of the following: Live virus vaccines This medication may also interact with  the following: Medications that treat or prevent blood clots, such as warfarin, enoxaparin, dalteparin This list may not describe all possible interactions. Give your health care provider a list of all the medicines, herbs, non-prescription drugs, or dietary supplements you use. Also tell them if you smoke, drink alcohol, or use illegal drugs. Some items may interact with your medicine. What should I watch for while using this medication? Your condition will be monitored carefully while you are receiving this medication. This medication may make you feel generally unwell. This is not uncommon as chemotherapy can affect healthy cells as well as cancer cells. Report any side effects. Continue your course of treatment even though you feel ill unless your care team tells you to stop. In some cases, you may be given additional medications to help with side effects. Follow all directions for their use. This medication may increase your risk of getting an infection. Call your care team for advice if you get a fever, chills, sore throat, or other symptoms of a cold or flu. Do not treat yourself. Try to avoid being around people who are sick. This medication may increase your risk to bruise or bleed. Call your care team if you notice any unusual bleeding. Be careful brushing or flossing your teeth or using a toothpick because you may get an infection or bleed more easily. If you have any dental work done, tell your dentist you are receiving this medication. Avoid taking medications that contain aspirin, acetaminophen, ibuprofen, naproxen, or ketoprofen unless instructed by your care team. These medications may hide a fever. Do not treat diarrhea with over the counter products. Contact your care team if you have diarrhea that lasts more than 2 days or if it is severe and watery. This medication can make you more sensitive to the sun. Keep out of the sun. If you cannot avoid being in the sun, wear protective  clothing and sunscreen. Do not use sun lamps, tanning beds, or tanning booths. Talk to your care team if you or your partner wish to become pregnant or think you might be pregnant. This medication can cause serious birth defects if taken during pregnancy and for 3 months after the last dose. A reliable form of contraception is recommended while taking this medication and for 3 months after the last dose. Talk to your care team about effective forms of contraception. Do not father a child while taking this medication and for  3 months after the last dose. Use a condom while having sex during this time period. Do not breastfeed while taking this medication. This medication may cause infertility. Talk to your care team if you are concerned about your fertility. What side effects may I notice from receiving this medication? Side effects that you should report to your care team as soon as possible: Allergic reactions--skin rash, itching, hives, swelling of the face, lips, tongue, or throat Heart attack--pain or tightness in the chest, shoulders, arms, or jaw, nausea, shortness of breath, cold or clammy skin, feeling faint or lightheaded Heart failure--shortness of breath, swelling of the ankles, feet, or hands, sudden weight gain, unusual weakness or fatigue Heart rhythm changes--fast or irregular heartbeat, dizziness, feeling faint or lightheaded, chest pain, trouble breathing High ammonia level--unusual weakness or fatigue, confusion, loss of appetite, nausea, vomiting, seizures Infection--fever, chills, cough, sore throat, wounds that don't heal, pain or trouble when passing urine, general feeling of discomfort or being unwell Low red blood cell level--unusual weakness or fatigue, dizziness, headache, trouble breathing Pain, tingling, or numbness in the hands or feet, muscle weakness, change in vision, confusion or trouble speaking, loss of balance or coordination, trouble walking, seizures Redness,  swelling, and blistering of the skin over hands and feet Severe or prolonged diarrhea Unusual bruising or bleeding Side effects that usually do not require medical attention (report to your care team if they continue or are bothersome): Dry skin Headache Increased tears Nausea Pain, redness, or swelling with sores inside the mouth or throat Sensitivity to light Vomiting This list may not describe all possible side effects. Call your doctor for medical advice about side effects. You may report side effects to FDA at 1-800-FDA-1088. Where should I keep my medication? This medication is given in a hospital or clinic. It will not be stored at home. NOTE: This sheet is a summary. It may not cover all possible information. If you have questions about this medicine, talk to your doctor, pharmacist, or health care provider.  2024 [employer]/Gold Standard (2021-11-14 00:00:00)  The chemotherapy medication bag should finish at 46 hours, 96 hours, or 7 days. For example, if your pump is scheduled for 46 hours and it was put on at 4:00 p.m., it should finish at 2:00 p.m. the day it is scheduled to come off regardless of your appointment time.     Estimated time to finish at 11:30 a.m. on Friday 04/12/2023.   If the display on your pump reads "Low Volume" and it is beeping, take the batteries out of the pump and come to the cancer center for it to be taken off.   If the pump alarms go off prior to the pump reading "Low Volume" then call [phone] and someone can assist you.  If the plunger comes out and the chemotherapy medication is leaking out, please use your home chemo spill kit to clean up the spill. Do NOT use paper towels or other household products.  If you have problems or questions regarding your pump, please call either [phone] (24 hours a day) or the cancer center Monday-Friday 8:00 a.m.- 4:30 p.m. at the clinic number and we will assist you. If you are unable to get assistance,  then go to the nearest Emergency Department and ask the staff to contact the IV team for assistance.

## 2023-04-11 ENCOUNTER — Other Ambulatory Visit: Payer: Self-pay

## 2023-04-12 ENCOUNTER — Inpatient Hospital Stay: Payer: Medicare Other

## 2023-04-12 VITALS — BP 108/72 | HR 109 | Temp 97.2°F | Resp 18

## 2023-04-12 DIAGNOSIS — Z5112 Encounter for antineoplastic immunotherapy: Secondary | ICD-10-CM | POA: Diagnosis not present

## 2023-04-12 DIAGNOSIS — C187 Malignant neoplasm of sigmoid colon: Secondary | ICD-10-CM

## 2023-04-12 MED ORDER — SODIUM CHLORIDE 0.9% FLUSH
10.0000 mL | INTRAVENOUS | Status: DC | PRN
  Administered 2023-04-12: 10 mL

## 2023-04-12 MED ORDER — HEPARIN SOD (PORK) LOCK FLUSH 100 UNIT/ML IV SOLN
500.0000 [IU] | Freq: Once | INTRAVENOUS | Status: AC | PRN
  Administered 2023-04-12: 500 [IU]

## 2023-04-18 ENCOUNTER — Encounter: Payer: Self-pay | Admitting: Oncology

## 2023-04-19 ENCOUNTER — Ambulatory Visit (HOSPITAL_BASED_OUTPATIENT_CLINIC_OR_DEPARTMENT_OTHER)
Admission: RE | Admit: 2023-04-19 | Discharge: 2023-04-19 | Disposition: A | Discharge status: Home / Self Care | Payer: Medicare Other | Source: Ambulatory Visit | Attending: Oncology | Admitting: Oncology

## 2023-04-19 ENCOUNTER — Inpatient Hospital Stay: Payer: Medicare Other

## 2023-04-19 DIAGNOSIS — C187 Malignant neoplasm of sigmoid colon: Secondary | ICD-10-CM | POA: Insufficient documentation

## 2023-04-19 MED ORDER — IOHEXOL 300 MG/ML  SOLN
100.0000 mL | Freq: Once | INTRAMUSCULAR | Status: AC | PRN
  Administered 2023-04-19: 100 mL via INTRAVENOUS

## 2023-04-20 ENCOUNTER — Other Ambulatory Visit: Payer: Self-pay | Admitting: Oncology

## 2023-04-20 DIAGNOSIS — C187 Malignant neoplasm of sigmoid colon: Secondary | ICD-10-CM

## 2023-04-24 ENCOUNTER — Encounter: Payer: Self-pay | Admitting: *Deleted

## 2023-04-24 ENCOUNTER — Inpatient Hospital Stay: Payer: Medicare Other

## 2023-04-24 ENCOUNTER — Inpatient Hospital Stay: Payer: Medicare Other | Attending: Oncology | Admitting: Oncology

## 2023-04-24 VITALS — BP 104/71 | HR 106

## 2023-04-24 VITALS — BP 98/83 | HR 102 | Temp 97.9°F | Resp 18 | Ht 66.0 in | Wt 210.9 lb

## 2023-04-24 DIAGNOSIS — Z79630 Long term (current) use of alkylating agent: Secondary | ICD-10-CM | POA: Insufficient documentation

## 2023-04-24 DIAGNOSIS — C787 Secondary malignant neoplasm of liver and intrahepatic bile duct: Secondary | ICD-10-CM | POA: Diagnosis not present

## 2023-04-24 DIAGNOSIS — Z5111 Encounter for antineoplastic chemotherapy: Secondary | ICD-10-CM | POA: Diagnosis present

## 2023-04-24 DIAGNOSIS — Z5112 Encounter for antineoplastic immunotherapy: Secondary | ICD-10-CM | POA: Diagnosis present

## 2023-04-24 DIAGNOSIS — C187 Malignant neoplasm of sigmoid colon: Secondary | ICD-10-CM | POA: Insufficient documentation

## 2023-04-24 DIAGNOSIS — D701 Agranulocytosis secondary to cancer chemotherapy: Secondary | ICD-10-CM | POA: Diagnosis not present

## 2023-04-24 DIAGNOSIS — T451X5A Adverse effect of antineoplastic and immunosuppressive drugs, initial encounter: Secondary | ICD-10-CM | POA: Insufficient documentation

## 2023-04-24 LAB — CEA (ACCESS): CEA (CHCC): 12.76 ng/mL — ABNORMAL HIGH (ref 0.00–5.00)

## 2023-04-24 LAB — CMP (CANCER CENTER ONLY)
ALT: 32 U/L (ref 0–44)
AST: 37 U/L (ref 15–41)
Albumin: 3.9 g/dL (ref 3.5–5.0)
Alkaline Phosphatase: 116 U/L (ref 38–126)
Anion gap: 9 (ref 5–15)
BUN: 12 mg/dL (ref 6–20)
CO2: 29 mmol/L (ref 22–32)
Calcium: 9.3 mg/dL (ref 8.9–10.3)
Chloride: 103 mmol/L (ref 98–111)
Creatinine: 0.71 mg/dL (ref 0.61–1.24)
GFR, Estimated: >60 mL/min (ref >60)
Glucose, Bld: 72 mg/dL (ref 70–99)
Potassium: 3.5 mmol/L (ref 3.5–5.1)
Sodium: 141 mmol/L (ref 135–145)
Total Bilirubin: 0.5 mg/dL (ref 0.3–1.2)
Total Protein: 7.4 g/dL (ref 6.5–8.1)

## 2023-04-24 LAB — CBC WITH DIFFERENTIAL (CANCER CENTER ONLY)
Abs Immature Granulocytes: 0.01 10*3/uL (ref 0.00–0.07)
Basophils Absolute: 0.1 10*3/uL (ref 0.0–0.1)
Basophils Relative: 1 %
Eosinophils Absolute: 0.3 10*3/uL (ref 0.0–0.5)
Eosinophils Relative: 5 %
HCT: 48.2 % (ref 39.0–52.0)
Hemoglobin: 15.6 g/dL (ref 13.0–17.0)
Immature Granulocytes: 0 %
Lymphocytes Relative: 34 %
Lymphs Abs: 2.2 10*3/uL (ref 0.7–4.0)
MCH: 28.1 pg (ref 26.0–34.0)
MCHC: 32.4 g/dL (ref 30.0–36.0)
MCV: 86.7 fL (ref 80.0–100.0)
Monocytes Absolute: 0.9 10*3/uL (ref 0.1–1.0)
Monocytes Relative: 13 %
Neutro Abs: 3.1 10*3/uL (ref 1.7–7.7)
Neutrophils Relative %: 47 %
Platelet Count: 97 10*3/uL — ABNORMAL LOW (ref 150–400)
RBC: 5.56 MIL/uL (ref 4.22–5.81)
RDW: 22.3 % — ABNORMAL HIGH (ref 11.5–15.5)
WBC Count: 6.6 10*3/uL (ref 4.0–10.5)
nRBC: 0 % (ref 0.0–0.2)

## 2023-04-24 LAB — MAGNESIUM: Magnesium: 1.7 mg/dL (ref 1.7–2.4)

## 2023-04-24 MED ORDER — SODIUM CHLORIDE 0.9 % IV SOLN
6.0000 mg/kg | Freq: Once | INTRAVENOUS | Status: AC
  Administered 2023-04-24: 600 mg via INTRAVENOUS
  Filled 2023-04-24: qty 20

## 2023-04-24 MED ORDER — FLUOROURACIL CHEMO INJECTION 2.5 GM/50ML
400.0000 mg/m2 | Freq: Once | INTRAVENOUS | Status: AC
  Administered 2023-04-24: 850 mg via INTRAVENOUS
  Filled 2023-04-24: qty 17

## 2023-04-24 MED ORDER — SODIUM CHLORIDE 0.9 % IV SOLN
10.0000 mg | Freq: Once | INTRAVENOUS | Status: AC
  Administered 2023-04-24: 10 mg via INTRAVENOUS
  Filled 2023-04-24: qty 10

## 2023-04-24 MED ORDER — PALONOSETRON HCL INJECTION 0.25 MG/5ML
0.2500 mg | Freq: Once | INTRAVENOUS | Status: AC
  Administered 2023-04-24: 0.25 mg via INTRAVENOUS
  Filled 2023-04-24: qty 5

## 2023-04-24 MED ORDER — SODIUM CHLORIDE 0.9 % IV SOLN
2400.0000 mg/m2 | INTRAVENOUS | Status: DC
  Administered 2023-04-24: 5000 mg via INTRAVENOUS
  Filled 2023-04-24: qty 100

## 2023-04-24 MED ORDER — LEUCOVORIN CALCIUM INJECTION 350 MG
400.0000 mg/m2 | Freq: Once | INTRAVENOUS | Status: AC
  Administered 2023-04-24: 844 mg via INTRAVENOUS
  Filled 2023-04-24: qty 42.2

## 2023-04-24 MED ORDER — SODIUM CHLORIDE 0.9 % IV SOLN: Freq: Once | INTRAVENOUS | Status: AC

## 2023-04-24 MED ORDER — DEXTROSE 5 % IV SOLN: Freq: Once | INTRAVENOUS | Status: AC

## 2023-04-24 MED ORDER — OXALIPLATIN CHEMO INJECTION 100 MG/20ML
65.0000 mg/m2 | Freq: Once | INTRAVENOUS | Status: AC
  Administered 2023-04-24: 150 mg via INTRAVENOUS
  Filled 2023-04-24: qty 20

## 2023-04-24 NOTE — Progress Notes (Signed)
[facility] OFFICE PROGRESS NOTE   Diagnosis: Colon cancer  INTERVAL HISTORY:   Daniel [name] returns as scheduled.  He completed another cycle of FOLFOX/panitumumab on 04/10/2023.  No nausea/vomiting or diarrhea.  He reports a stable skin rash.  He has dryness of the hands with mild paronychia.  He reports a few mouth sores that improved with Magic mouthwash.  He had cold sensitivity for 5 days following chemotherapy.  No neuropathy symptoms at present.  Objective:  Vital signs in last 24 hours:  Blood pressure 98/83, pulse (!) 102, temperature 97.9 F (36.6 C), temperature source Temporal, resp. rate 18, height 5\' 6"  (1.676 m), weight 210 lb 14.4 oz (95.7 kg), SpO2 100%.    HEENT: No thrush or ulcers, healed ulcer at the left buccal mucosa? Resp: Lungs clear bilaterally Cardio: Regular rate and rhythm GI: No hepatosplenomegaly, nontender Vascular: No leg edema Neuro: Mild loss of vibratory sense at the right fingertips, moderate loss of vibratory sense at the left fingertips Skin: Acne type rash over the chest, back, and face.  Few areas of paronychia at the distal fingers and nailbeds  Portacath/PICC-without erythema  Lab Results:  Lab Results  Component Value Date   WBC 6.6 04/24/2023   HGB 15.6 04/24/2023   HCT 48.2 04/24/2023   MCV 86.7 04/24/2023   PLT 97 (L) 04/24/2023   NEUTROABS 3.1 04/24/2023    CMP  Lab Results  Component Value Date   NA 141 04/24/2023   K 3.5 04/24/2023   CL 103 04/24/2023   CO2 29 04/24/2023   GLUCOSE 72 04/24/2023   BUN 12 04/24/2023   CREATININE 0.71 04/24/2023   CALCIUM 9.3 04/24/2023   PROT 7.4 04/24/2023   ALBUMIN 3.9 04/24/2023   AST 37 04/24/2023   ALT 32 04/24/2023   ALKPHOS 116 04/24/2023   BILITOT 0.5 04/24/2023   GFRNONAA >60 04/24/2023   GFRAA >60 07/23/2016    Lab Results  Component Value Date   CEA 12.76 (H) 04/24/2023    Medications: I have reviewed the patient's current  medications.   Assessment/Plan: Sigmoid colon mass, multiple liver lesions Ultrasound abdomen 01/08/2023-multifocal hyperechoic liver lesions, largest measuring 8.7 cm in the right hepatic lobe CT abdomen/pelvis 01/08/2023-multiple by lobar hypoattenuating hepatic lesions, masslike thickening of the sigmoid colon with abnormal spiculated pericolonic soft tissue.  IMA lymphadenopathy.  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 Colonoscopy 01/29/2023-malignant tumor in the sigmoid colon (invasive moderately differentiated adenocarcinoma), four 6-9 mm polyps in the rectum, ascending colon and cecum, one 14 mm polyp in the sigmoid colon (within the rectal polyp biopsies there are 2 similar-appearing fragments showing invasive tumor with associated adenomatous change which is felt to be carryover from the sigmoid mass biopsies, the rectal polyp biopsies also show multiple fragments of tubular adenoma without high-grade dysplasia) Cycle 1 FOLFOX 01/30/2023 Cycle 2 FOLFOX 02/12/2023 Cycle 3 FOLFOX plus Panitumumab 02/27/2023 Cycle 4 FOLFOX plus Panitumumab 03/13/2023 Cycle 5 FOLFOX plus panitumumab 03/27/2023 Cycle 6 FOLFOX plus Panitumumab 04/10/2023 CTs 04/19/2023-decrease size of hepatic metastases, no new lesions, decreased right upper quadrant and retroperitoneal lymph nodes, sigmoid colonic wall thickening no longer seen with a decrease in sigmoid mesenteric lymph nodes Cycle 7 FOLFOX plus panitumumab 04/24/2023, 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 Diabetes Cardiomyopathy Hypertension Hypercholesterolemia Weight loss secondary to [facility], metastatic carcinoma? Microcytic anemia  secondary to #1    Disposition: Daniel [name] has  completed 6 cycles of FOLFOX (4 cycles with panitumumab).  He has tolerated the treatment well.  The CEA is lower and the restaging CTs reveal a significant response to therapy.  I reviewed the CT findings and images with Daniel [name] and his wife.  I recommend continuing FOLFOX plus panitumumab.  He is developing early oxaliplatin neuropathy.  Oxaliplatin will be dose reduced secondary to neuropathy and thrombocytopenia.  He will complete a cycle of FOLFOX/panitumumab today.  Daniel [name] will return for an office visit and chemotherapy in 2 weeks.  Thornton Papas, MD  04/24/2023  9:32 AM

## 2023-04-24 NOTE — Progress Notes (Signed)
Patient seen by Dr. Thornton Papas today  Vitals are within treatment parameters:No (Please specify and give further instructions.)Pulse 102--OK to proceed  Labs are within treatment parameters: No (Please specify and give further instructions.) Platelet count 97,000--OK to proceed  Treatment plan has been signed: Yes   Per physician team, Patient is ready for treatment. Please note the following modifications: Dose reduction of oxaliplatin.

## 2023-04-24 NOTE — Patient Instructions (Addendum)
[facility] [facility]   The chemotherapy medication bag should finish at 46 hours, 96 hours, or 7 days. For example, if your pump is scheduled for 46 hours and it was put on at 4:00 p.m., it should finish at 2:00 p.m. the day it is scheduled to come off regardless of your appointment time.     Estimated time to finish at 1245 Friday, April 26, 2023.   If the display on your pump reads "Low Volume" and it is beeping, take the batteries out of the pump and come to the cancer center for it to be taken off.   If the pump alarms go off prior to the pump reading "Low Volume" then call [phone] and someone can assist you.  If the plunger comes out and the chemotherapy medication is leaking out, please use your home chemo spill kit to clean up the spill. Do NOT use paper towels or other household products.  If you have problems or questions regarding your pump, please call either [phone] (24 hours a day) or the cancer center Monday-Friday 8:00 a.m.- 4:30 p.m. at the clinic number and we will assist you. If you are unable to get assistance, then go to the nearest Emergency Department and ask the staff to contact the IV team for assistance.  Discharge Instructions: Thank you for choosing [facility] to provide your oncology and hematology care.   If you have a lab appointment with the Cancer Center, please go directly to the Cancer Center and check in at the registration area.   Wear comfortable clothing and clothing appropriate for easy access to any Portacath or PICC line.   We strive to give you quality time with your provider. You may need to reschedule your appointment if you arrive late (15 or more minutes).  Arriving late affects you and other patients whose appointments are after yours.  Also, if you miss three or more appointments without notifying the office, you may be dismissed from the clinic at the provider's discretion.      For  prescription refill requests, have your pharmacy contact our office and allow 72 hours for refills to be completed.    Today you received the following chemotherapy and/or immunotherapy agents Vectibix, Oxaliplatin, Leucovorin, Fluorouracil.       To help prevent nausea and vomiting after your treatment, we encourage you to take your nausea medication as directed.  BELOW ARE SYMPTOMS THAT SHOULD BE REPORTED IMMEDIATELY: *FEVER GREATER THAN 100.4 F (38 C) OR HIGHER *CHILLS OR SWEATING *NAUSEA AND VOMITING THAT IS NOT CONTROLLED WITH YOUR NAUSEA MEDICATION *UNUSUAL SHORTNESS OF BREATH *UNUSUAL BRUISING OR BLEEDING *URINARY PROBLEMS (pain or burning when urinating, or frequent urination) *BOWEL PROBLEMS (unusual diarrhea, constipation, pain near the anus) TENDERNESS IN MOUTH AND THROAT WITH OR WITHOUT PRESENCE OF ULCERS (sore throat, sores in mouth, or a toothache) UNUSUAL RASH, SWELLING OR PAIN  UNUSUAL VAGINAL DISCHARGE OR ITCHING   Items with * indicate a potential emergency and should be followed up as soon as possible or go to the Emergency Department if any problems should occur.  Please show the CHEMOTHERAPY ALERT CARD or IMMUNOTHERAPY ALERT CARD at check-in to the Emergency Department and triage nurse.  Should you have questions after your visit or need to cancel or reschedule your appointment, please contact [facility]  Dept: [phone]  and follow the prompts.  Office hours are 8:00 a.m. to 4:30 p.m. Monday - Friday.  Please note that voicemails left after 4:00 p.m. may not be returned until the following business day.  We are closed weekends and major holidays. You have access to a nurse at all times for urgent questions. Please call the main number to the clinic Dept: [phone] and follow the prompts.   For any non-urgent questions, you may also contact your provider using MyChart. We now offer e-Visits for anyone 26 and older to request  care online for non-urgent symptoms. For details visit [email or web address].   Also download the MyChart app! Go to the app store, search "MyChart", open the app, select [facility], and log in with your MyChart username and password.  Panitumumab Injection What is this medication? PANITUMUMAB (pan i TOOM ue mab) treats colorectal cancer. It works by blocking a protein that causes cancer cells to grow and multiply. This helps to slow or stop the spread of cancer cells. It is a monoclonal antibody. This medicine may be used for other purposes; ask your health care provider or pharmacist if you have questions. COMMON BRAND NAME(S): Vectibix What should I tell my care team before I take this medication? They need to know if you have any of these conditions: Eye disease Low levels of magnesium in the blood Lung disease An unusual or allergic reaction to panitumumab, other medications, foods, dyes, or preservatives Pregnant or trying to get pregnant Breast-feeding How should I use this medication? This medication is injected into a vein. It is given by your care team in a hospital or clinic setting. Talk to your care team about the use of this medication in children. Special care may be needed. Overdosage: If you think you have taken too much of this medicine contact a poison control center or emergency room at once. NOTE: This medicine is only for you. Do not share this medicine with others. What if I miss a dose? Keep appointments for follow-up doses. It is important not to miss your dose. Call your care team if you are unable to keep an appointment. What may interact with this medication? Bevacizumab This list may not describe all possible interactions. Give your health care provider a list of all the medicines, herbs, non-prescription drugs, or dietary supplements you use. Also tell them if you smoke, drink alcohol, or use illegal drugs. Some items may interact with your medicine. What  should I watch for while using this medication? Your condition will be monitored carefully while you are receiving this medication. This medication may make you feel generally unwell. This is not uncommon as chemotherapy can affect healthy cells as well as cancer cells. Report any side effects. Continue your course of treatment even though you feel ill unless your care team tells you to stop. This medication can make you more sensitive to the sun. Keep out of the sun while receiving this medication and for 2 months after stopping therapy. If you cannot avoid being in the sun, wear protective clothing and sunscreen. Do not use sun lamps, tanning beds, or tanning booths. Check with your care team if you have severe diarrhea, nausea, and vomiting or if you sweat a lot. The loss of too much body fluid may make it dangerous for you to take this medication. This medication may cause serious skin reactions. They can happen weeks to months after starting the medication. Contact your care team right away if you notice fevers or flu-like symptoms with a rash. The rash may be red or purple and then turn into blisters or  peeling of the skin. You may also notice a red rash with swelling of the face, lips, or lymph nodes in your neck or under your arms. Talk to your care team if you may be pregnant. Serious birth defects can occur if you take this medication during pregnancy and for 2 months after the last dose. Contraception is recommended while taking this medication and for 2 months after the last dose. Your care team can help you find the option that works for you. Do not breastfeed while taking this medication and for 2 months after the last dose. This medication may cause infertility. Talk to your care team if you are concerned about your fertility. What side effects may I notice from receiving this medication? Side effects that you should report to your care team as soon as possible: Allergic reactions--skin  rash, itching, hives, swelling of the face, lips, tongue, or throat Dry cough, shortness of breath or trouble breathing Eye pain, redness, irritation, or discharge with blurry or decreased vision Infusion reactions--chest pain, shortness of breath or trouble breathing, feeling faint or lightheaded Low magnesium level--muscle pain or cramps, unusual weakness or fatigue, fast or irregular heartbeat, tremors Low potassium level--muscle pain or cramps, unusual weakness or fatigue, fast or irregular heartbeat, constipation Redness, blistering, peeling, or loosening of the skin, including inside the mouth Skin reactions on sun-exposed areas Side effects that usually do not require medical attention (report to your care team if they continue or are bothersome): Change in nail shape, thickness, or color Diarrhea Dry skin Fatigue Nausea Vomiting This list may not describe all possible side effects. Call your doctor for medical advice about side effects. You may report side effects to FDA at 1-800-FDA-1088. Where should I keep my medication? This medication is given in a hospital or clinic. It will not be stored at home. NOTE: This sheet is a summary. It may not cover all possible information. If you have questions about this medicine, talk to your doctor, pharmacist, or health care provider.  2024 Elsevier/Gold Standard (2021-11-22 00:00:00)  Oxaliplatin Injection What is this medication? OXALIPLATIN (ox AL i PLA tin) treats colorectal cancer. It works by slowing down the growth of cancer cells. This medicine may be used for other purposes; ask your health care provider or pharmacist if you have questions. COMMON BRAND NAME(S): Eloxatin What should I tell my care team before I take this medication? They need to know if you have any of these conditions: Heart disease History of irregular heartbeat or rhythm Liver disease Low blood cell levels (white cells, red cells, and platelets) Lung or  breathing disease, such as asthma Take medications that treat or prevent blood clots Tingling of the fingers, toes, or other nerve disorder An unusual or allergic reaction to oxaliplatin, other medications, foods, dyes, or preservatives If you or your partner are pregnant or trying to get pregnant Breast-feeding How should I use this medication? This medication is injected into a vein. It is given by your care team in a hospital or clinic setting. Talk to your care team about the use of this medication in children. Special care may be needed. Overdosage: If you think you have taken too much of this medicine contact a poison control center or emergency room at once. NOTE: This medicine is only for you. Do not share this medicine with others. What if I miss a dose? Keep appointments for follow-up doses. It is important not to miss a dose. Call your care team if you are  unable to keep an appointment. What may interact with this medication? Do not take this medication with any of the following: Cisapride Dronedarone Pimozide Thioridazine This medication may also interact with the following: Aspirin and aspirin-like medications Certain medications that treat or prevent blood clots, such as warfarin, apixaban, dabigatran, and rivaroxaban Cisplatin Cyclosporine Diuretics Medications for infection, such as acyclovir, adefovir, amphotericin B, bacitracin, cidofovir, foscarnet, ganciclovir, gentamicin, pentamidine, vancomycin NSAIDs, medications for pain and inflammation, such as ibuprofen or naproxen Other medications that cause heart rhythm changes Pamidronate Zoledronic acid This list may not describe all possible interactions. Give your health care provider a list of all the medicines, herbs, non-prescription drugs, or dietary supplements you use. Also tell them if you smoke, drink alcohol, or use illegal drugs. Some items may interact with your medicine. What should I watch for while using  this medication? Your condition will be monitored carefully while you are receiving this medication. You may need blood work while taking this medication. This medication may make you feel generally unwell. This is not uncommon as chemotherapy can affect healthy cells as well as cancer cells. Report any side effects. Continue your course of treatment even though you feel ill unless your care team tells you to stop. This medication may increase your risk of getting an infection. Call your care team for advice if you get a fever, chills, sore throat, or other symptoms of a cold or flu. Do not treat yourself. Try to avoid being around people who are sick. Avoid taking medications that contain aspirin, acetaminophen, ibuprofen, naproxen, or ketoprofen unless instructed by your care team. These medications may hide a fever. Be careful brushing or flossing your teeth or using a toothpick because you may get an infection or bleed more easily. If you have any dental work done, tell your dentist you are receiving this medication. This medication can make you more sensitive to cold. Do not drink cold drinks or use ice. Cover exposed skin before coming in contact with cold temperatures or cold objects. When out in cold weather wear warm clothing and cover your mouth and nose to warm the air that goes into your lungs. Tell your care team if you get sensitive to the cold. Talk to your care team if you or your partner are pregnant or think either of you might be pregnant. This medication can cause serious birth defects if taken during pregnancy and for 9 months after the last dose. A negative pregnancy test is required before starting this medication. A reliable form of contraception is recommended while taking this medication and for 9 months after the last dose. Talk to your care team about effective forms of contraception. Do not father a child while taking this medication and for 6 months after the last dose. Use a  condom while having sex during this time period. Do not breastfeed while taking this medication and for 3 months after the last dose. This medication may cause infertility. Talk to your care team if you are concerned about your fertility. What side effects may I notice from receiving this medication? Side effects that you should report to your care team as soon as possible: Allergic reactions--skin rash, itching, hives, swelling of the face, lips, tongue, or throat Bleeding--bloody or black, tar-like stools, vomiting blood or brown material that looks like coffee grounds, red or dark brown urine, small red or purple spots on skin, unusual bruising or bleeding Dry cough, shortness of breath or trouble breathing Heart rhythm changes--fast or  irregular heartbeat, dizziness, feeling faint or lightheaded, chest pain, trouble breathing Infection--fever, chills, cough, sore throat, wounds that don't heal, pain or trouble when passing urine, general feeling of discomfort or being unwell Liver injury--right upper belly pain, loss of appetite, nausea, light-colored stool, dark yellow or brown urine, yellowing skin or eyes, unusual weakness or fatigue Low red blood cell level--unusual weakness or fatigue, dizziness, headache, trouble breathing Muscle injury--unusual weakness or fatigue, muscle pain, dark yellow or brown urine, decrease in amount of urine Pain, tingling, or numbness in the hands or feet Sudden and severe headache, confusion, change in vision, seizures, which may be signs of posterior reversible encephalopathy syndrome (PRES) Unusual bruising or bleeding Side effects that usually do not require medical attention (report to your care team if they continue or are bothersome): Diarrhea Nausea Pain, redness, or swelling with sores inside the mouth or throat Unusual weakness or fatigue Vomiting This list may not describe all possible side effects. Call your doctor for medical advice about side  effects. You may report side effects to FDA at 1-800-FDA-1088. Where should I keep my medication? This medication is given in a hospital or clinic. It will not be stored at home. NOTE: This sheet is a summary. It may not cover all possible information. If you have questions about this medicine, talk to your doctor, pharmacist, or health care provider.  2024 Elsevier/Gold Standard (2022-04-24 00:00:00)  Leucovorin Injection What is this medication? LEUCOVORIN (loo koe VOR in) prevents side effects from certain medications, such as methotrexate. It works by increasing folate levels. This helps protect healthy cells in your body. It may also be used to treat anemia caused by low levels of folate. It can also be used with fluorouracil, a type of chemotherapy, to treat colorectal cancer. It works by increasing the effects of fluorouracil in the body. This medicine may be used for other purposes; ask your health care provider or pharmacist if you have questions. What should I tell my care team before I take this medication? They need to know if you have any of these conditions: Anemia from low levels of vitamin B12 in the blood An unusual or allergic reaction to leucovorin, folic acid, other medications, foods, dyes, or preservatives Pregnant or trying to get pregnant Breastfeeding How should I use this medication? This medication is injected into a vein or a muscle. It is given by your care team in a hospital or clinic setting. Talk to your care team about the use of this medication in children. Special care may be needed. Overdosage: If you think you have taken too much of this medicine contact a poison control center or emergency room at once. NOTE: This medicine is only for you. Do not share this medicine with others. What if I miss a dose? Keep appointments for follow-up doses. It is important not to miss your dose. Call your care team if you are unable to keep an appointment. What may  interact with this medication? Capecitabine Fluorouracil Phenobarbital Phenytoin Primidone Trimethoprim;sulfamethoxazole This list may not describe all possible interactions. Give your health care provider a list of all the medicines, herbs, non-prescription drugs, or dietary supplements you use. Also tell them if you smoke, drink alcohol, or use illegal drugs. Some items may interact with your medicine. What should I watch for while using this medication? Your condition will be monitored carefully while you are receiving this medication. This medication may increase the side effects of 5-fluorouracil. Tell your care team if you have  diarrhea or mouth sores that do not get better or that get worse. What side effects may I notice from receiving this medication? Side effects that you should report to your care team as soon as possible: Allergic reactions--skin rash, itching, hives, swelling of the face, lips, tongue, or throat This list may not describe all possible side effects. Call your doctor for medical advice about side effects. You may report side effects to FDA at 1-800-FDA-1088. Where should I keep my medication? This medication is given in a hospital or clinic. It will not be stored at home. NOTE: This sheet is a summary. It may not cover all possible information. If you have questions about this medicine, talk to your doctor, pharmacist, or health care provider.  2024 Elsevier/Gold Standard (2021-12-12 00:00:00)  Fluorouracil Injection What is this medication? FLUOROURACIL (flure oh YOOR a sil) treats some types of cancer. It works by slowing down the growth of cancer cells. This medicine may be used for other purposes; ask your health care provider or pharmacist if you have questions. COMMON BRAND NAME(S): Adrucil What should I tell my care team before I take this medication? They need to know if you have any of these conditions: Blood disorders Dihydropyrimidine dehydrogenase  (DPD) deficiency Infection, such as chickenpox, cold sores, herpes Kidney disease Liver disease Poor nutrition Recent or ongoing radiation therapy An unusual or allergic reaction to fluorouracil, other medications, foods, dyes, or preservatives If you or your partner are pregnant or trying to get pregnant Breast-feeding How should I use this medication? This medication is injected into a vein. It is administered by your care team in a hospital or clinic setting. Talk to your care team about the use of this medication in children. Special care may be needed. Overdosage: If you think you have taken too much of this medicine contact a poison control center or emergency room at once. NOTE: This medicine is only for you. Do not share this medicine with others. What if I miss a dose? Keep appointments for follow-up doses. It is important not to miss your dose. Call your care team if you are unable to keep an appointment. What may interact with this medication? Do not take this medication with any of the following: Live virus vaccines This medication may also interact with the following: Medications that treat or prevent blood clots, such as warfarin, enoxaparin, dalteparin This list may not describe all possible interactions. Give your health care provider a list of all the medicines, herbs, non-prescription drugs, or dietary supplements you use. Also tell them if you smoke, drink alcohol, or use illegal drugs. Some items may interact with your medicine. What should I watch for while using this medication? Your condition will be monitored carefully while you are receiving this medication. This medication may make you feel generally unwell. This is not uncommon as chemotherapy can affect healthy cells as well as cancer cells. Report any side effects. Continue your course of treatment even though you feel ill unless your care team tells you to stop. In some cases, you may be given additional  medications to help with side effects. Follow all directions for their use. This medication may increase your risk of getting an infection. Call your care team for advice if you get a fever, chills, sore throat, or other symptoms of a cold or flu. Do not treat yourself. Try to avoid being around people who are sick. This medication may increase your risk to bruise or bleed. Call your care  team if you notice any unusual bleeding. Be careful brushing or flossing your teeth or using a toothpick because you may get an infection or bleed more easily. If you have any dental work done, tell your dentist you are receiving this medication. Avoid taking medications that contain aspirin, acetaminophen, ibuprofen, naproxen, or ketoprofen unless instructed by your care team. These medications may hide a fever. Do not treat diarrhea with over the counter products. Contact your care team if you have diarrhea that lasts more than 2 days or if it is severe and watery. This medication can make you more sensitive to the sun. Keep out of the sun. If you cannot avoid being in the sun, wear protective clothing and sunscreen. Do not use sun lamps, tanning beds, or tanning booths. Talk to your care team if you or your partner wish to become pregnant or think you might be pregnant. This medication can cause serious birth defects if taken during pregnancy and for 3 months after the last dose. A reliable form of contraception is recommended while taking this medication and for 3 months after the last dose. Talk to your care team about effective forms of contraception. Do not father a child while taking this medication and for 3 months after the last dose. Use a condom while having sex during this time period. Do not breastfeed while taking this medication. This medication may cause infertility. Talk to your care team if you are concerned about your fertility. What side effects may I notice from receiving this medication? Side  effects that you should report to your care team as soon as possible: Allergic reactions--skin rash, itching, hives, swelling of the face, lips, tongue, or throat Heart attack--pain or tightness in the chest, shoulders, arms, or jaw, nausea, shortness of breath, cold or clammy skin, feeling faint or lightheaded Heart failure--shortness of breath, swelling of the ankles, feet, or hands, sudden weight gain, unusual weakness or fatigue Heart rhythm changes--fast or irregular heartbeat, dizziness, feeling faint or lightheaded, chest pain, trouble breathing High ammonia level--unusual weakness or fatigue, confusion, loss of appetite, nausea, vomiting, seizures Infection--fever, chills, cough, sore throat, wounds that don't heal, pain or trouble when passing urine, general feeling of discomfort or being unwell Low red blood cell level--unusual weakness or fatigue, dizziness, headache, trouble breathing Pain, tingling, or numbness in the hands or feet, muscle weakness, change in vision, confusion or trouble speaking, loss of balance or coordination, trouble walking, seizures Redness, swelling, and blistering of the skin over hands and feet Severe or prolonged diarrhea Unusual bruising or bleeding Side effects that usually do not require medical attention (report to your care team if they continue or are bothersome): Dry skin Headache Increased tears Nausea Pain, redness, or swelling with sores inside the mouth or throat Sensitivity to light Vomiting This list may not describe all possible side effects. Call your doctor for medical advice about side effects. You may report side effects to FDA at 1-800-FDA-1088. Where should I keep my medication? This medication is given in a hospital or clinic. It will not be stored at home. NOTE: This sheet is a summary. It may not cover all possible information. If you have questions about this medicine, talk to your doctor, pharmacist, or health care provider.   2024 [employer] (2021-11-14 00:00:00)

## 2023-04-25 ENCOUNTER — Other Ambulatory Visit: Payer: Self-pay

## 2023-04-26 ENCOUNTER — Inpatient Hospital Stay: Payer: Medicare Other

## 2023-04-26 DIAGNOSIS — C187 Malignant neoplasm of sigmoid colon: Secondary | ICD-10-CM

## 2023-04-26 DIAGNOSIS — Z5112 Encounter for antineoplastic immunotherapy: Secondary | ICD-10-CM | POA: Diagnosis not present

## 2023-04-26 MED ORDER — SODIUM CHLORIDE 0.9% FLUSH
10.0000 mL | Freq: Once | INTRAVENOUS | Status: AC
  Administered 2023-04-26: 10 mL via INTRAVENOUS

## 2023-04-26 MED ORDER — HEPARIN SOD (PORK) LOCK FLUSH 100 UNIT/ML IV SOLN
500.0000 [IU] | Freq: Once | INTRAVENOUS | Status: AC
  Administered 2023-04-26: 500 [IU] via INTRAVENOUS

## 2023-04-26 NOTE — Patient Instructions (Signed)

## 2023-05-05 ENCOUNTER — Other Ambulatory Visit: Payer: Self-pay | Admitting: Oncology

## 2023-05-05 ENCOUNTER — Other Ambulatory Visit: Payer: Self-pay | Admitting: Nurse Practitioner

## 2023-05-05 DIAGNOSIS — C187 Malignant neoplasm of sigmoid colon: Secondary | ICD-10-CM

## 2023-05-08 ENCOUNTER — Inpatient Hospital Stay: Payer: Medicare Other

## 2023-05-08 ENCOUNTER — Encounter: Payer: Self-pay | Admitting: Nurse Practitioner

## 2023-05-08 ENCOUNTER — Inpatient Hospital Stay (HOSPITAL_BASED_OUTPATIENT_CLINIC_OR_DEPARTMENT_OTHER): Payer: Medicare Other | Admitting: Nurse Practitioner

## 2023-05-08 VITALS — BP 106/69 | HR 103 | Temp 98.1°F | Resp 18 | Ht 66.0 in | Wt 212.3 lb

## 2023-05-08 DIAGNOSIS — Z5112 Encounter for antineoplastic immunotherapy: Secondary | ICD-10-CM | POA: Diagnosis not present

## 2023-05-08 DIAGNOSIS — C187 Malignant neoplasm of sigmoid colon: Secondary | ICD-10-CM

## 2023-05-08 LAB — CBC WITH DIFFERENTIAL (CANCER CENTER ONLY)
Abs Immature Granulocytes: 0.01 10*3/uL (ref 0.00–0.07)
Basophils Absolute: 0.1 10*3/uL (ref 0.0–0.1)
Basophils Relative: 1 %
Eosinophils Absolute: 0.3 10*3/uL (ref 0.0–0.5)
Eosinophils Relative: 4 %
HCT: 46.9 % (ref 39.0–52.0)
Hemoglobin: 15.3 g/dL (ref 13.0–17.0)
Immature Granulocytes: 0 %
Lymphocytes Relative: 35 %
Lymphs Abs: 2.3 10*3/uL (ref 0.7–4.0)
MCH: 28.9 pg (ref 26.0–34.0)
MCHC: 32.6 g/dL (ref 30.0–36.0)
MCV: 88.7 fL (ref 80.0–100.0)
Monocytes Absolute: 0.9 10*3/uL (ref 0.1–1.0)
Monocytes Relative: 14 %
Neutro Abs: 2.9 10*3/uL (ref 1.7–7.7)
Neutrophils Relative %: 46 %
Platelet Count: 90 10*3/uL — ABNORMAL LOW (ref 150–400)
RBC: 5.29 MIL/uL (ref 4.22–5.81)
RDW: 20.6 % — ABNORMAL HIGH (ref 11.5–15.5)
WBC Count: 6.4 10*3/uL (ref 4.0–10.5)
nRBC: 0 % (ref 0.0–0.2)

## 2023-05-08 LAB — CMP (CANCER CENTER ONLY)
ALT: 29 U/L (ref 0–44)
AST: 33 U/L (ref 15–41)
Albumin: 3.9 g/dL (ref 3.5–5.0)
Alkaline Phosphatase: 130 U/L — ABNORMAL HIGH (ref 38–126)
Anion gap: 9 (ref 5–15)
BUN: 11 mg/dL (ref 6–20)
CO2: 28 mmol/L (ref 22–32)
Calcium: 9.1 mg/dL (ref 8.9–10.3)
Chloride: 104 mmol/L (ref 98–111)
Creatinine: 0.68 mg/dL (ref 0.61–1.24)
GFR, Estimated: >60 mL/min (ref >60)
Glucose, Bld: 104 mg/dL — ABNORMAL HIGH (ref 70–99)
Potassium: 3.4 mmol/L — ABNORMAL LOW (ref 3.5–5.1)
Sodium: 141 mmol/L (ref 135–145)
Total Bilirubin: 0.5 mg/dL (ref 0.3–1.2)
Total Protein: 7.1 g/dL (ref 6.5–8.1)

## 2023-05-08 LAB — MAGNESIUM: Magnesium: 1.7 mg/dL (ref 1.7–2.4)

## 2023-05-08 LAB — CEA (ACCESS): CEA (CHCC): 6.68 ng/mL — ABNORMAL HIGH (ref 0.00–5.00)

## 2023-05-08 MED ORDER — DEXTROSE 5 % IV SOLN: Freq: Once | INTRAVENOUS | Status: AC

## 2023-05-08 MED ORDER — OXALIPLATIN CHEMO INJECTION 100 MG/20ML
65.0000 mg/m2 | Freq: Once | INTRAVENOUS | Status: AC
  Administered 2023-05-08: 150 mg via INTRAVENOUS
  Filled 2023-05-08: qty 20

## 2023-05-08 MED ORDER — LEUCOVORIN CALCIUM INJECTION 350 MG
400.0000 mg/m2 | Freq: Once | INTRAVENOUS | Status: AC
  Administered 2023-05-08: 844 mg via INTRAVENOUS
  Filled 2023-05-08: qty 42.2

## 2023-05-08 MED ORDER — PALONOSETRON HCL INJECTION 0.25 MG/5ML
0.2500 mg | Freq: Once | INTRAVENOUS | Status: AC
  Administered 2023-05-08: 0.25 mg via INTRAVENOUS
  Filled 2023-05-08: qty 5

## 2023-05-08 MED ORDER — SODIUM CHLORIDE 0.9 % IV SOLN
2400.0000 mg/m2 | INTRAVENOUS | Status: DC
  Administered 2023-05-08: 5000 mg via INTRAVENOUS
  Filled 2023-05-08: qty 100

## 2023-05-08 MED ORDER — FLUOROURACIL CHEMO INJECTION 2.5 GM/50ML
400.0000 mg/m2 | Freq: Once | INTRAVENOUS | Status: AC
  Administered 2023-05-08: 850 mg via INTRAVENOUS
  Filled 2023-05-08: qty 17

## 2023-05-08 MED ORDER — SODIUM CHLORIDE 0.9 % IV SOLN
10.0000 mg | Freq: Once | INTRAVENOUS | Status: AC
  Administered 2023-05-08: 10 mg via INTRAVENOUS
  Filled 2023-05-08: qty 10

## 2023-05-08 MED ORDER — SODIUM CHLORIDE 0.9 % IV SOLN
6.0000 mg/kg | Freq: Once | INTRAVENOUS | Status: AC
  Administered 2023-05-08: 600 mg via INTRAVENOUS
  Filled 2023-05-08: qty 20

## 2023-05-08 MED ORDER — SODIUM CHLORIDE 0.9 % IV SOLN: Freq: Once | INTRAVENOUS | Status: AC

## 2023-05-08 NOTE — Patient Instructions (Signed)
[facility]  The chemotherapy medication bag should finish at 46 hours, 96 hours, or 7 days. For example, if your pump is scheduled for 46 hours and it was put on at 4:00 p.m., it should finish at 2:00 p.m. the day it is scheduled to come off regardless of your appointment time.     Estimated time to finish at Friday, May 10, 2023.   If the display on your pump reads "Low Volume" and it is beeping, take the batteries out of the pump and come to the cancer center for it to be taken off.   If the pump alarms go off prior to the pump reading "Low Volume" then call [phone] and someone can assist you.  If the plunger comes out and the chemotherapy medication is leaking out, please use your home chemo spill kit to clean up the spill. Do NOT use paper towels or other household products.  If you have problems or questions regarding your pump, please call either [phone] (24 hours a day) or the cancer center Monday-Friday 8:00 a.m.- 4:30 p.m. at the clinic number and we will assist you. If you are unable to get assistance, then go to the nearest Emergency Department and ask the staff to contact the IV team for assistance.   Discharge Instructions: Thank you for choosing [facility] to provide your oncology and hematology care.   If you have a lab appointment with the Cancer Center, please go directly to the Cancer Center and check in at the registration area.   Wear comfortable clothing and clothing appropriate for easy access to any Portacath or PICC line.   We strive to give you quality time with your provider. You may need to reschedule your appointment if you arrive late (15 or more minutes).  Arriving late affects you and other patients whose appointments are after yours.  Also, if you miss three or more appointments without notifying the office, you may be dismissed from the clinic at the provider's discretion.      For prescription  refill requests, have your pharmacy contact our office and allow 72 hours for refills to be completed.    Today you received the following chemotherapy and/or immunotherapy agents Vectibix/Oxaliplatin/Leucovorin/Fluorouracil.      To help prevent nausea and vomiting after your treatment, we encourage you to take your nausea medication as directed.  BELOW ARE SYMPTOMS THAT SHOULD BE REPORTED IMMEDIATELY: *FEVER GREATER THAN 100.4 F (38 C) OR HIGHER *CHILLS OR SWEATING *NAUSEA AND VOMITING THAT IS NOT CONTROLLED WITH YOUR NAUSEA MEDICATION *UNUSUAL SHORTNESS OF BREATH *UNUSUAL BRUISING OR BLEEDING *URINARY PROBLEMS (pain or burning when urinating, or frequent urination) *BOWEL PROBLEMS (unusual diarrhea, constipation, pain near the anus) TENDERNESS IN MOUTH AND THROAT WITH OR WITHOUT PRESENCE OF ULCERS (sore throat, sores in mouth, or a toothache) UNUSUAL RASH, SWELLING OR PAIN  UNUSUAL VAGINAL DISCHARGE OR ITCHING   Items with * indicate a potential emergency and should be followed up as soon as possible or go to the Emergency Department if any problems should occur.  Please show the CHEMOTHERAPY ALERT CARD or IMMUNOTHERAPY ALERT CARD at check-in to the Emergency Department and triage nurse.  Should you have questions after your visit or need to cancel or reschedule your appointment, please contact [facility]  Dept: [phone]  and follow the prompts.  Office hours are 8:00 a.m. to 4:30 p.m. Monday - Friday. Please note that voicemails left  after 4:00 p.m. may not be returned until the following business day.  We are closed weekends and major holidays. You have access to a nurse at all times for urgent questions. Please call the main number to the clinic Dept: [phone] and follow the prompts.   For any non-urgent questions, you may also contact your provider using MyChart. We now offer e-Visits for anyone 7 and older to request care online for  non-urgent symptoms. For details visit [email or web address].   Also download the MyChart app! Go to the app store, search "MyChart", open the app, select [place], and log in with your MyChart username and password.  Panitumumab Injection What is this medication? PANITUMUMAB (pan i TOOM ue mab) treats colorectal cancer. It works by blocking a protein that causes cancer cells to grow and multiply. This helps to slow or stop the spread of cancer cells. It is a monoclonal antibody. This medicine may be used for other purposes; ask your health care provider or pharmacist if you have questions. COMMON BRAND NAME(S): Vectibix What should I tell my care team before I take this medication? They need to know if you have any of these conditions: Eye disease Low levels of magnesium in the blood Lung disease An unusual or allergic reaction to panitumumab, other medications, foods, dyes, or preservatives Pregnant or trying to get pregnant Breast-feeding How should I use this medication? This medication is injected into a vein. It is given by your care team in a hospital or clinic setting. Talk to your care team about the use of this medication in children. Special care may be needed. Overdosage: If you think you have taken too much of this medicine contact a poison control center or emergency room at once. NOTE: This medicine is only for you. Do not share this medicine with others. What if I miss a dose? Keep appointments for follow-up doses. It is important not to miss your dose. Call your care team if you are unable to keep an appointment. What may interact with this medication? Bevacizumab This list may not describe all possible interactions. Give your health care provider a list of all the medicines, herbs, non-prescription drugs, or dietary supplements you use. Also tell them if you smoke, drink alcohol, or use illegal drugs. Some items may interact with your medicine. What should I watch for  while using this medication? Your condition will be monitored carefully while you are receiving this medication. This medication may make you feel generally unwell. This is not uncommon as chemotherapy can affect healthy cells as well as cancer cells. Report any side effects. Continue your course of treatment even though you feel ill unless your care team tells you to stop. This medication can make you more sensitive to the sun. Keep out of the sun while receiving this medication and for 2 months after stopping therapy. If you cannot avoid being in the sun, wear protective clothing and sunscreen. Do not use sun lamps, tanning beds, or tanning booths. Check with your care team if you have severe diarrhea, nausea, and vomiting or if you sweat a lot. The loss of too much body fluid may make it dangerous for you to take this medication. This medication may cause serious skin reactions. They can happen weeks to months after starting the medication. Contact your care team right away if you notice fevers or flu-like symptoms with a rash. The rash may be red or purple and then turn into blisters or peeling of the skin. You  may also notice a red rash with swelling of the face, lips, or lymph nodes in your neck or under your arms. Talk to your care team if you may be pregnant. Serious birth defects can occur if you take this medication during pregnancy and for 2 months after the last dose. Contraception is recommended while taking this medication and for 2 months after the last dose. Your care team can help you find the option that works for you. Do not breastfeed while taking this medication and for 2 months after the last dose. This medication may cause infertility. Talk to your care team if you are concerned about your fertility. What side effects may I notice from receiving this medication? Side effects that you should report to your care team as soon as possible: Allergic reactions--skin rash, itching, hives,  swelling of the face, lips, tongue, or throat Dry cough, shortness of breath or trouble breathing Eye pain, redness, irritation, or discharge with blurry or decreased vision Infusion reactions--chest pain, shortness of breath or trouble breathing, feeling faint or lightheaded Low magnesium level--muscle pain or cramps, unusual weakness or fatigue, fast or irregular heartbeat, tremors Low potassium level--muscle pain or cramps, unusual weakness or fatigue, fast or irregular heartbeat, constipation Redness, blistering, peeling, or loosening of the skin, including inside the mouth Skin reactions on sun-exposed areas Side effects that usually do not require medical attention (report to your care team if they continue or are bothersome): Change in nail shape, thickness, or color Diarrhea Dry skin Fatigue Nausea Vomiting This list may not describe all possible side effects. Call your doctor for medical advice about side effects. You may report side effects to FDA at 1-800-FDA-1088. Where should I keep my medication? This medication is given in a hospital or clinic. It will not be stored at home. NOTE: This sheet is a summary. It may not cover all possible information. If you have questions about this medicine, talk to your doctor, pharmacist, or health care provider.  2024 Elsevier/Gold Standard (2021-11-22 00:00:00) Oxaliplatin Injection What is this medication? OXALIPLATIN (ox AL i PLA tin) treats colorectal cancer. It works by slowing down the growth of cancer cells. This medicine may be used for other purposes; ask your health care provider or pharmacist if you have questions. COMMON BRAND NAME(S): Eloxatin What should I tell my care team before I take this medication? They need to know if you have any of these conditions: Heart disease History of irregular heartbeat or rhythm Liver disease Low blood cell levels (white cells, red cells, and platelets) Lung or breathing disease, such as  asthma Take medications that treat or prevent blood clots Tingling of the fingers, toes, or other nerve disorder An unusual or allergic reaction to oxaliplatin, other medications, foods, dyes, or preservatives If you or your partner are pregnant or trying to get pregnant Breast-feeding How should I use this medication? This medication is injected into a vein. It is given by your care team in a hospital or clinic setting. Talk to your care team about the use of this medication in children. Special care may be needed. Overdosage: If you think you have taken too much of this medicine contact a poison control center or emergency room at once. NOTE: This medicine is only for you. Do not share this medicine with others. What if I miss a dose? Keep appointments for follow-up doses. It is important not to miss a dose. Call your care team if you are unable to keep an appointment. What  may interact with this medication? Do not take this medication with any of the following: Cisapride Dronedarone Pimozide Thioridazine This medication may also interact with the following: Aspirin and aspirin-like medications Certain medications that treat or prevent blood clots, such as warfarin, apixaban, dabigatran, and rivaroxaban Cisplatin Cyclosporine Diuretics Medications for infection, such as acyclovir, adefovir, amphotericin B, bacitracin, cidofovir, foscarnet, ganciclovir, gentamicin, pentamidine, vancomycin NSAIDs, medications for pain and inflammation, such as ibuprofen or naproxen Other medications that cause heart rhythm changes Pamidronate Zoledronic acid This list may not describe all possible interactions. Give your health care provider a list of all the medicines, herbs, non-prescription drugs, or dietary supplements you use. Also tell them if you smoke, drink alcohol, or use illegal drugs. Some items may interact with your medicine. What should I watch for while using this medication? Your  condition will be monitored carefully while you are receiving this medication. You may need blood work while taking this medication. This medication may make you feel generally unwell. This is not uncommon as chemotherapy can affect healthy cells as well as cancer cells. Report any side effects. Continue your course of treatment even though you feel ill unless your care team tells you to stop. This medication may increase your risk of getting an infection. Call your care team for advice if you get a fever, chills, sore throat, or other symptoms of a cold or flu. Do not treat yourself. Try to avoid being around people who are sick. Avoid taking medications that contain aspirin, acetaminophen, ibuprofen, naproxen, or ketoprofen unless instructed by your care team. These medications may hide a fever. Be careful brushing or flossing your teeth or using a toothpick because you may get an infection or bleed more easily. If you have any dental work done, tell your dentist you are receiving this medication. This medication can make you more sensitive to cold. Do not drink cold drinks or use ice. Cover exposed skin before coming in contact with cold temperatures or cold objects. When out in cold weather wear warm clothing and cover your mouth and nose to warm the air that goes into your lungs. Tell your care team if you get sensitive to the cold. Talk to your care team if you or your partner are pregnant or think either of you might be pregnant. This medication can cause serious birth defects if taken during pregnancy and for 9 months after the last dose. A negative pregnancy test is required before starting this medication. A reliable form of contraception is recommended while taking this medication and for 9 months after the last dose. Talk to your care team about effective forms of contraception. Do not father a child while taking this medication and for 6 months after the last dose. Use a condom while having sex  during this time period. Do not breastfeed while taking this medication and for 3 months after the last dose. This medication may cause infertility. Talk to your care team if you are concerned about your fertility. What side effects may I notice from receiving this medication? Side effects that you should report to your care team as soon as possible: Allergic reactions--skin rash, itching, hives, swelling of the face, lips, tongue, or throat Bleeding--bloody or black, tar-like stools, vomiting blood or brown material that looks like coffee grounds, red or dark brown urine, small red or purple spots on skin, unusual bruising or bleeding Dry cough, shortness of breath or trouble breathing Heart rhythm changes--fast or irregular heartbeat, dizziness, feeling faint or  lightheaded, chest pain, trouble breathing Infection--fever, chills, cough, sore throat, wounds that don't heal, pain or trouble when passing urine, general feeling of discomfort or being unwell Liver injury--right upper belly pain, loss of appetite, nausea, light-colored stool, dark yellow or brown urine, yellowing skin or eyes, unusual weakness or fatigue Low red blood cell level--unusual weakness or fatigue, dizziness, headache, trouble breathing Muscle injury--unusual weakness or fatigue, muscle pain, dark yellow or brown urine, decrease in amount of urine Pain, tingling, or numbness in the hands or feet Sudden and severe headache, confusion, change in vision, seizures, which may be signs of posterior reversible encephalopathy syndrome (PRES) Unusual bruising or bleeding Side effects that usually do not require medical attention (report to your care team if they continue or are bothersome): Diarrhea Nausea Pain, redness, or swelling with sores inside the mouth or throat Unusual weakness or fatigue Vomiting This list may not describe all possible side effects. Call your doctor for medical advice about side effects. You may report  side effects to FDA at 1-800-FDA-1088. Where should I keep my medication? This medication is given in a hospital or clinic. It will not be stored at home. NOTE: This sheet is a summary. It may not cover all possible information. If you have questions about this medicine, talk to your doctor, pharmacist, or health care provider.  2024 Elsevier/Gold Standard (2022-04-24 00:00:00) Leucovorin Injection What is this medication? LEUCOVORIN (loo koe VOR in) prevents side effects from certain medications, such as methotrexate. It works by increasing folate levels. This helps protect healthy cells in your body. It may also be used to treat anemia caused by low levels of folate. It can also be used with fluorouracil, a type of chemotherapy, to treat colorectal cancer. It works by increasing the effects of fluorouracil in the body. This medicine may be used for other purposes; ask your health care provider or pharmacist if you have questions. What should I tell my care team before I take this medication? They need to know if you have any of these conditions: Anemia from low levels of vitamin B12 in the blood An unusual or allergic reaction to leucovorin, folic acid, other medications, foods, dyes, or preservatives Pregnant or trying to get pregnant Breastfeeding How should I use this medication? This medication is injected into a vein or a muscle. It is given by your care team in a hospital or clinic setting. Talk to your care team about the use of this medication in children. Special care may be needed. Overdosage: If you think you have taken too much of this medicine contact a poison control center or emergency room at once. NOTE: This medicine is only for you. Do not share this medicine with others. What if I miss a dose? Keep appointments for follow-up doses. It is important not to miss your dose. Call your care team if you are unable to keep an appointment. What may interact with this  medication? Capecitabine Fluorouracil Phenobarbital Phenytoin Primidone Trimethoprim;sulfamethoxazole This list may not describe all possible interactions. Give your health care provider a list of all the medicines, herbs, non-prescription drugs, or dietary supplements you use. Also tell them if you smoke, drink alcohol, or use illegal drugs. Some items may interact with your medicine. What should I watch for while using this medication? Your condition will be monitored carefully while you are receiving this medication. This medication may increase the side effects of 5-fluorouracil. Tell your care team if you have diarrhea or mouth sores that do not  get better or that get worse. What side effects may I notice from receiving this medication? Side effects that you should report to your care team as soon as possible: Allergic reactions--skin rash, itching, hives, swelling of the face, lips, tongue, or throat This list may not describe all possible side effects. Call your doctor for medical advice about side effects. You may report side effects to FDA at 1-800-FDA-1088. Where should I keep my medication? This medication is given in a hospital or clinic. It will not be stored at home. NOTE: This sheet is a summary. It may not cover all possible information. If you have questions about this medicine, talk to your doctor, pharmacist, or health care provider.  2024 Elsevier/Gold Standard (2021-12-12 00:00:00) Fluorouracil Injection What is this medication? FLUOROURACIL (flure oh YOOR a sil) treats some types of cancer. It works by slowing down the growth of cancer cells. This medicine may be used for other purposes; ask your health care provider or pharmacist if you have questions. COMMON BRAND NAME(S): Adrucil What should I tell my care team before I take this medication? They need to know if you have any of these conditions: Blood disorders Dihydropyrimidine dehydrogenase (DPD)  deficiency Infection, such as chickenpox, cold sores, herpes Kidney disease Liver disease Poor nutrition Recent or ongoing radiation therapy An unusual or allergic reaction to fluorouracil, other medications, foods, dyes, or preservatives If you or your partner are pregnant or trying to get pregnant Breast-feeding How should I use this medication? This medication is injected into a vein. It is administered by your care team in a hospital or clinic setting. Talk to your care team about the use of this medication in children. Special care may be needed. Overdosage: If you think you have taken too much of this medicine contact a poison control center or emergency room at once. NOTE: This medicine is only for you. Do not share this medicine with others. What if I miss a dose? Keep appointments for follow-up doses. It is important not to miss your dose. Call your care team if you are unable to keep an appointment. What may interact with this medication? Do not take this medication with any of the following: Live virus vaccines This medication may also interact with the following: Medications that treat or prevent blood clots, such as warfarin, enoxaparin, dalteparin This list may not describe all possible interactions. Give your health care provider a list of all the medicines, herbs, non-prescription drugs, or dietary supplements you use. Also tell them if you smoke, drink alcohol, or use illegal drugs. Some items may interact with your medicine. What should I watch for while using this medication? Your condition will be monitored carefully while you are receiving this medication. This medication may make you feel generally unwell. This is not uncommon as chemotherapy can affect healthy cells as well as cancer cells. Report any side effects. Continue your course of treatment even though you feel ill unless your care team tells you to stop. In some cases, you may be given additional medications  to help with side effects. Follow all directions for their use. This medication may increase your risk of getting an infection. Call your care team for advice if you get a fever, chills, sore throat, or other symptoms of a cold or flu. Do not treat yourself. Try to avoid being around people who are sick. This medication may increase your risk to bruise or bleed. Call your care team if you notice any unusual bleeding. Be  careful brushing or flossing your teeth or using a toothpick because you may get an infection or bleed more easily. If you have any dental work done, tell your dentist you are receiving this medication. Avoid taking medications that contain aspirin, acetaminophen, ibuprofen, naproxen, or ketoprofen unless instructed by your care team. These medications may hide a fever. Do not treat diarrhea with over the counter products. Contact your care team if you have diarrhea that lasts more than 2 days or if it is severe and watery. This medication can make you more sensitive to the sun. Keep out of the sun. If you cannot avoid being in the sun, wear protective clothing and sunscreen. Do not use sun lamps, tanning beds, or tanning booths. Talk to your care team if you or your partner wish to become pregnant or think you might be pregnant. This medication can cause serious birth defects if taken during pregnancy and for 3 months after the last dose. A reliable form of contraception is recommended while taking this medication and for 3 months after the last dose. Talk to your care team about effective forms of contraception. Do not father a child while taking this medication and for 3 months after the last dose. Use a condom while having sex during this time period. Do not breastfeed while taking this medication. This medication may cause infertility. Talk to your care team if you are concerned about your fertility. What side effects may I notice from receiving this medication? Side effects that you  should report to your care team as soon as possible: Allergic reactions--skin rash, itching, hives, swelling of the face, lips, tongue, or throat Heart attack--pain or tightness in the chest, shoulders, arms, or jaw, nausea, shortness of breath, cold or clammy skin, feeling faint or lightheaded Heart failure--shortness of breath, swelling of the ankles, feet, or hands, sudden weight gain, unusual weakness or fatigue Heart rhythm changes--fast or irregular heartbeat, dizziness, feeling faint or lightheaded, chest pain, trouble breathing High ammonia level--unusual weakness or fatigue, confusion, loss of appetite, nausea, vomiting, seizures Infection--fever, chills, cough, sore throat, wounds that don't heal, pain or trouble when passing urine, general feeling of discomfort or being unwell Low red blood cell level--unusual weakness or fatigue, dizziness, headache, trouble breathing Pain, tingling, or numbness in the hands or feet, muscle weakness, change in vision, confusion or trouble speaking, loss of balance or coordination, trouble walking, seizures Redness, swelling, and blistering of the skin over hands and feet Severe or prolonged diarrhea Unusual bruising or bleeding Side effects that usually do not require medical attention (report to your care team if they continue or are bothersome): Dry skin Headache Increased tears Nausea Pain, redness, or swelling with sores inside the mouth or throat Sensitivity to light Vomiting This list may not describe all possible side effects. Call your doctor for medical advice about side effects. You may report side effects to FDA at 1-800-FDA-1088. Where should I keep my medication? This medication is given in a hospital or clinic. It will not be stored at home. NOTE: This sheet is a summary. It may not cover all possible information. If you have questions about this medicine, talk to your doctor, pharmacist, or health care provider.  2024 [employer]vier/Gold  Standard (2021-11-14 00:00:00)

## 2023-05-08 NOTE — Progress Notes (Signed)
Patient seen by Lonna Cobb NP today  Vitals are within treatment parameters:No (Please specify and give further instructions.)Pulse 103 it is okay to proceed. Per [employer] are within treatment parameters: No (Please specify and give further instructions.) plts 90 and K= 3.2 it is okay to proceed  Per lisa  Treatment plan has been signed: Yes   Per physician team, Patient is ready for treatment and there are NO modifications to the treatment plan.

## 2023-05-08 NOTE — Progress Notes (Signed)
[facility] OFFICE PROGRESS NOTE   Diagnosis: Colon cancer  INTERVAL HISTORY:   Daniel [name] returns as scheduled.  He completed cycle 7 FOLFOX plus Panitumumab 04/24/2023.  Oxaliplatin was dose reduced due to thrombocytopenia and neuropathy.  He denies nausea/vomiting.  No mouth sores.  No diarrhea.  He has mild intermittent cold sensitivity.  No persistent neuropathy symptoms.  Skin rash is stable.  He is applying moisturizers.  He continues doxycycline.  Objective:  Vital signs in last 24 hours:  Blood pressure 106/69, pulse (!) 103, temperature 98.1 F (36.7 C), temperature source Temporal, resp. rate 18, height 5\' 6"  (1.676 m), weight 212 lb 4.8 oz (96.3 kg), SpO2 99%.    HEENT: No thrush or ulcers. Resp: Lungs clear bilaterally. Cardio: Regular rate and rhythm. GI: No hepatosplenomegaly. Vascular: No leg edema. Neuro: Vibratory sense intact over the fingertips per tuning fork exam. Skin: Soles are dry appearing.  Palms with mild erythema.  No skin breakdown on the palms or soles. Port-A-Cath without erythema.  Lab Results:  Lab Results  Component Value Date   WBC 6.4 05/08/2023   HGB 15.3 05/08/2023   HCT 46.9 05/08/2023   MCV 88.7 05/08/2023   PLT 90 (L) 05/08/2023   NEUTROABS 2.9 05/08/2023    Imaging:  No results found.  Medications: I have reviewed the patient's current medications.  Assessment/Plan: Sigmoid colon mass, multiple liver lesions Ultrasound abdomen 01/08/2023-multifocal hyperechoic liver lesions, largest measuring 8.7 cm in the right hepatic lobe CT abdomen/pelvis 01/08/2023-multiple by lobar hypoattenuating hepatic lesions, masslike thickening of the sigmoid colon with abnormal spiculated pericolonic soft tissue.  IMA lymphadenopathy.  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 Colonoscopy 01/29/2023-malignant tumor in the sigmoid colon (invasive moderately differentiated adenocarcinoma), four 6-9 mm polyps in the rectum, ascending colon and cecum, one 14 mm polyp in the sigmoid colon (within the rectal polyp biopsies there are 2 similar-appearing fragments showing invasive tumor with associated adenomatous change which is felt to be carryover from the sigmoid mass biopsies, the rectal polyp biopsies also show multiple fragments of tubular adenoma without high-grade dysplasia) Cycle 1 FOLFOX 01/30/2023 Cycle 2 FOLFOX 02/12/2023 Cycle 3 FOLFOX plus Panitumumab 02/27/2023 Cycle 4 FOLFOX plus Panitumumab 03/13/2023 Cycle 5 FOLFOX plus panitumumab 03/27/2023 Cycle 6 FOLFOX plus Panitumumab 04/10/2023 CTs 04/19/2023-decrease size of hepatic metastases, no new lesions, decreased right upper quadrant and retroperitoneal lymph nodes, sigmoid colonic wall thickening no longer seen with a decrease in sigmoid mesenteric lymph nodes Cycle 7 FOLFOX plus panitumumab 04/24/2023, 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 Cycle 8 FOLFOX plus Panitumumab 05/08/2023 Diabetes Cardiomyopathy Hypertension Hypercholesterolemia Weight loss secondary to [facility], metastatic carcinoma? Microcytic anemia secondary to #1  Disposition: [name] appears stable.  He has completed 7 cycles of FOLFOX plus Panitumumab.  Oxaliplatin was dose reduced with cycle 7 due to thrombocytopenia and neuropathy.  CBC from today shows persistent thrombocytopenia, platelet count mildly lower as compared to 2 weeks ago.  Plan to proceed with treatment today as scheduled.  CBC and chemistry panel reviewed.  Labs adequate to proceed as above.  He understands to contact the office with any bleeding.  He will return for follow-up and treatment in 2 weeks.  We are available to see him sooner if needed.  Lonna Cobb ANP/GNP-BC   05/08/2023  10:58 AM

## 2023-05-10 ENCOUNTER — Other Ambulatory Visit: Payer: Self-pay

## 2023-05-10 ENCOUNTER — Inpatient Hospital Stay: Payer: Medicare Other

## 2023-05-10 VITALS — BP 99/75 | HR 109 | Temp 98.2°F | Resp 18

## 2023-05-10 DIAGNOSIS — C187 Malignant neoplasm of sigmoid colon: Secondary | ICD-10-CM

## 2023-05-10 DIAGNOSIS — Z5112 Encounter for antineoplastic immunotherapy: Secondary | ICD-10-CM | POA: Diagnosis not present

## 2023-05-10 MED ORDER — SODIUM CHLORIDE 0.9% FLUSH
10.0000 mL | INTRAVENOUS | Status: DC | PRN
  Administered 2023-05-10: 10 mL

## 2023-05-10 MED ORDER — HEPARIN SOD (PORK) LOCK FLUSH 100 UNIT/ML IV SOLN
500.0000 [IU] | Freq: Once | INTRAVENOUS | Status: AC | PRN
  Administered 2023-05-10: 500 [IU]

## 2023-05-10 NOTE — Patient Instructions (Signed)

## 2023-05-19 ENCOUNTER — Other Ambulatory Visit: Payer: Self-pay | Admitting: Oncology

## 2023-05-22 ENCOUNTER — Inpatient Hospital Stay: Payer: Medicare Other

## 2023-05-22 ENCOUNTER — Other Ambulatory Visit: Payer: Self-pay

## 2023-05-22 ENCOUNTER — Encounter: Payer: Self-pay | Admitting: Nurse Practitioner

## 2023-05-22 ENCOUNTER — Inpatient Hospital Stay (HOSPITAL_BASED_OUTPATIENT_CLINIC_OR_DEPARTMENT_OTHER): Payer: Medicare Other | Admitting: Nurse Practitioner

## 2023-05-22 VITALS — BP 100/74 | HR 96

## 2023-05-22 VITALS — BP 107/80 | HR 100 | Temp 98.1°F | Resp 18 | Ht 66.0 in | Wt 213.0 lb

## 2023-05-22 DIAGNOSIS — C187 Malignant neoplasm of sigmoid colon: Secondary | ICD-10-CM

## 2023-05-22 DIAGNOSIS — Z5112 Encounter for antineoplastic immunotherapy: Secondary | ICD-10-CM | POA: Diagnosis not present

## 2023-05-22 LAB — CBC WITH DIFFERENTIAL (CANCER CENTER ONLY)
Abs Immature Granulocytes: 0.01 10*3/uL (ref 0.00–0.07)
Basophils Absolute: 0.1 10*3/uL (ref 0.0–0.1)
Basophils Relative: 1 %
Eosinophils Absolute: 0.2 10*3/uL (ref 0.0–0.5)
Eosinophils Relative: 3 %
HCT: 46 % (ref 39.0–52.0)
Hemoglobin: 15 g/dL (ref 13.0–17.0)
Immature Granulocytes: 0 %
Lymphocytes Relative: 31 %
Lymphs Abs: 2.1 10*3/uL (ref 0.7–4.0)
MCH: 29.6 pg (ref 26.0–34.0)
MCHC: 32.6 g/dL (ref 30.0–36.0)
MCV: 90.7 fL (ref 80.0–100.0)
Monocytes Absolute: 0.8 10*3/uL (ref 0.1–1.0)
Monocytes Relative: 11 %
Neutro Abs: 3.8 10*3/uL (ref 1.7–7.7)
Neutrophils Relative %: 54 %
Platelet Count: 96 10*3/uL — ABNORMAL LOW (ref 150–400)
RBC: 5.07 MIL/uL (ref 4.22–5.81)
RDW: 19.2 % — ABNORMAL HIGH (ref 11.5–15.5)
WBC Count: 7 10*3/uL (ref 4.0–10.5)
nRBC: 0 % (ref 0.0–0.2)

## 2023-05-22 LAB — CMP (CANCER CENTER ONLY)
ALT: 28 U/L (ref 0–44)
AST: 34 U/L (ref 15–41)
Albumin: 3.9 g/dL (ref 3.5–5.0)
Alkaline Phosphatase: 120 U/L (ref 38–126)
Anion gap: 9 (ref 5–15)
BUN: 12 mg/dL (ref 6–20)
CO2: 28 mmol/L (ref 22–32)
Calcium: 9.3 mg/dL (ref 8.9–10.3)
Chloride: 103 mmol/L (ref 98–111)
Creatinine: 0.67 mg/dL (ref 0.61–1.24)
GFR, Estimated: >60 mL/min (ref >60)
Glucose, Bld: 118 mg/dL — ABNORMAL HIGH (ref 70–99)
Potassium: 3.4 mmol/L — ABNORMAL LOW (ref 3.5–5.1)
Sodium: 140 mmol/L (ref 135–145)
Total Bilirubin: 0.4 mg/dL (ref 0.3–1.2)
Total Protein: 6.7 g/dL (ref 6.5–8.1)

## 2023-05-22 LAB — CEA (ACCESS): CEA (CHCC): 5.38 ng/mL — ABNORMAL HIGH (ref 0.00–5.00)

## 2023-05-22 LAB — MAGNESIUM: Magnesium: 1.5 mg/dL — ABNORMAL LOW (ref 1.7–2.4)

## 2023-05-22 MED ORDER — SODIUM CHLORIDE 0.9 % IV SOLN: Freq: Once | INTRAVENOUS | Status: AC

## 2023-05-22 MED ORDER — DEXTROSE 5 % IV SOLN: Freq: Once | INTRAVENOUS | Status: AC

## 2023-05-22 MED ORDER — SODIUM CHLORIDE 0.9 % IV SOLN
2400.0000 mg/m2 | INTRAVENOUS | Status: DC
  Administered 2023-05-22: 5000 mg via INTRAVENOUS
  Filled 2023-05-22: qty 100

## 2023-05-22 MED ORDER — DEXAMETHASONE SODIUM PHOSPHATE 10 MG/ML IJ SOLN
10.0000 mg | Freq: Once | INTRAMUSCULAR | Status: AC
Start: 2023-05-22 — End: 2023-05-22
  Administered 2023-05-22: 10 mg via INTRAVENOUS
  Filled 2023-05-22: qty 1

## 2023-05-22 MED ORDER — SODIUM CHLORIDE 0.9 % IV SOLN
6.0000 mg/kg | Freq: Once | INTRAVENOUS | Status: AC
  Administered 2023-05-22: 600 mg via INTRAVENOUS
  Filled 2023-05-22: qty 20

## 2023-05-22 MED ORDER — OXALIPLATIN CHEMO INJECTION 100 MG/20ML
65.0000 mg/m2 | Freq: Once | INTRAVENOUS | Status: AC
  Administered 2023-05-22: 150 mg via INTRAVENOUS
  Filled 2023-05-22: qty 20

## 2023-05-22 MED ORDER — PALONOSETRON HCL INJECTION 0.25 MG/5ML
0.2500 mg | Freq: Once | INTRAVENOUS | Status: AC
  Administered 2023-05-22: 0.25 mg via INTRAVENOUS
  Filled 2023-05-22: qty 5

## 2023-05-22 MED ORDER — PROCHLORPERAZINE MALEATE 10 MG PO TABS: 10.0000 mg | ORAL_TABLET | Freq: Four times a day (QID) | ORAL | 3 refills | Status: AC | PRN

## 2023-05-22 MED ORDER — MAGNESIUM SULFATE 2 GM/50ML IV SOLN
2.0000 g | Freq: Once | INTRAVENOUS | Status: AC
  Administered 2023-05-22: 2 g via INTRAVENOUS
  Filled 2023-05-22: qty 50

## 2023-05-22 MED ORDER — LEUCOVORIN CALCIUM INJECTION 350 MG
400.0000 mg/m2 | Freq: Once | INTRAMUSCULAR | Status: AC
  Administered 2023-05-22: 844 mg via INTRAVENOUS
  Filled 2023-05-22: qty 42.2

## 2023-05-22 MED ORDER — FLUOROURACIL CHEMO INJECTION 2.5 GM/50ML
400.0000 mg/m2 | Freq: Once | INTRAVENOUS | Status: AC
  Administered 2023-05-22: 850 mg via INTRAVENOUS
  Filled 2023-05-22: qty 17

## 2023-05-22 MED ORDER — SODIUM CHLORIDE 0.9 % IV SOLN: 10.0000 mg | Freq: Once | INTRAVENOUS | Status: DC

## 2023-05-22 NOTE — Patient Instructions (Signed)
[facility]  The chemotherapy medication bag should finish at 46 hours, 96 hours, or 7 days. For example, if your pump is scheduled for 46 hours and it was put on at 4:00 p.m., it should finish at 2:00 p.m. the day it is scheduled to come off regardless of your appointment time.     Estimated time to finish at 12:30, Friday, May 24, 2023.   If the display on your pump reads "Low Volume" and it is beeping, take the batteries out of the pump and come to the cancer center for it to be taken off.   If the pump alarms go off prior to the pump reading "Low Volume" then call [phone] and someone can assist you.  If the plunger comes out and the chemotherapy medication is leaking out, please use your home chemo spill kit to clean up the spill. Do NOT use paper towels or other household products.  If you have problems or questions regarding your pump, please call either [phone] (24 hours a day) or the cancer center Monday-Friday 8:00 a.m.- 4:30 p.m. at the clinic number and we will assist you. If you are unable to get assistance, then go to the nearest Emergency Department and ask the staff to contact the IV team for assistance.   Discharge Instructions: Thank you for choosing [facility] to provide your oncology and hematology care.   If you have a lab appointment with the Cancer Center, please go directly to the Cancer Center and check in at the registration area.   Wear comfortable clothing and clothing appropriate for easy access to any Portacath or PICC line.   We strive to give you quality time with your provider. You may need to reschedule your appointment if you arrive late (15 or more minutes).  Arriving late affects you and other patients whose appointments are after yours.  Also, if you miss three or more appointments without notifying the office, you may be dismissed from the clinic at the provider's discretion.      For  prescription refill requests, have your pharmacy contact our office and allow 72 hours for refills to be completed.    Today you received the following chemotherapy and/or immunotherapy agents Vectibix/Oxaliplatin/Leucovorin/Fluorouracil.      To help prevent nausea and vomiting after your treatment, we encourage you to take your nausea medication as directed.  BELOW ARE SYMPTOMS THAT SHOULD BE REPORTED IMMEDIATELY: *FEVER GREATER THAN 100.4 F (38 C) OR HIGHER *CHILLS OR SWEATING *NAUSEA AND VOMITING THAT IS NOT CONTROLLED WITH YOUR NAUSEA MEDICATION *UNUSUAL SHORTNESS OF BREATH *UNUSUAL BRUISING OR BLEEDING *URINARY PROBLEMS (pain or burning when urinating, or frequent urination) *BOWEL PROBLEMS (unusual diarrhea, constipation, pain near the anus) TENDERNESS IN MOUTH AND THROAT WITH OR WITHOUT PRESENCE OF ULCERS (sore throat, sores in mouth, or a toothache) UNUSUAL RASH, SWELLING OR PAIN  UNUSUAL VAGINAL DISCHARGE OR ITCHING   Items with * indicate a potential emergency and should be followed up as soon as possible or go to the Emergency Department if any problems should occur.  Please show the CHEMOTHERAPY ALERT CARD or IMMUNOTHERAPY ALERT CARD at check-in to the Emergency Department and triage nurse.  Should you have questions after your visit or need to cancel or reschedule your appointment, please contact  [facility]  Dept: [phone]  and follow the prompts.  Office hours are 8:00 a.m. to 4:30 p.m. Monday - Friday. Please note that voicemails  left after 4:00 p.m. may not be returned until the following business day.  We are closed weekends and major holidays. You have access to a nurse at all times for urgent questions. Please call the main number to the clinic Dept: [phone] and follow the prompts.   For any non-urgent questions, you may also contact your provider using MyChart. We now offer e-Visits for anyone 74 and older to request care  online for non-urgent symptoms. For details visit [email or web address].   Also download the MyChart app! Go to the app store, search "MyChart", open the app, select , and log in with your MyChart username and password.  Panitumumab Injection What is this medication? PANITUMUMAB (pan i TOOM ue mab) treats colorectal cancer. It works by blocking a protein that causes cancer cells to grow and multiply. This helps to slow or stop the spread of cancer cells. It is a monoclonal antibody. This medicine may be used for other purposes; ask your health care provider or pharmacist if you have questions. COMMON BRAND NAME(S): Vectibix What should I tell my care team before I take this medication? They need to know if you have any of these conditions: Eye disease Low levels of magnesium in the blood Lung disease An unusual or allergic reaction to panitumumab, other medications, foods, dyes, or preservatives Pregnant or trying to get pregnant Breast-feeding How should I use this medication? This medication is injected into a vein. It is given by your care team in a hospital or clinic setting. Talk to your care team about the use of this medication in children. Special care may be needed. Overdosage: If you think you have taken too much of this medicine contact a poison control center or emergency room at once. NOTE: This medicine is only for you. Do not share this medicine with others. What if I miss a dose? Keep appointments for follow-up doses. It is important not to miss your dose. Call your care team if you are unable to keep an appointment. What may interact with this medication? Bevacizumab This list may not describe all possible interactions. Give your health care provider a list of all the medicines, herbs, non-prescription drugs, or dietary supplements you use. Also tell them if you smoke, drink alcohol, or use illegal drugs. Some items may interact with your medicine. What should  I watch for while using this medication? Your condition will be monitored carefully while you are receiving this medication. This medication may make you feel generally unwell. This is not uncommon as chemotherapy can affect healthy cells as well as cancer cells. Report any side effects. Continue your course of treatment even though you feel ill unless your care team tells you to stop. This medication can make you more sensitive to the sun. Keep out of the sun while receiving this medication and for 2 months after stopping therapy. If you cannot avoid being in the sun, wear protective clothing and sunscreen. Do not use sun lamps, tanning beds, or tanning booths. Check with your care team if you have severe diarrhea, nausea, and vomiting or if you sweat a lot. The loss of too much body fluid may make it dangerous for you to take this medication. This medication may cause serious skin reactions. They can happen weeks to months after starting the medication. Contact your care team right away if you notice fevers or flu-like symptoms with a rash. The rash may be red or purple and then turn into blisters or peeling of the skin.  You may also notice a red rash with swelling of the face, lips, or lymph nodes in your neck or under your arms. Talk to your care team if you may be pregnant. Serious birth defects can occur if you take this medication during pregnancy and for 2 months after the last dose. Contraception is recommended while taking this medication and for 2 months after the last dose. Your care team can help you find the option that works for you. Do not breastfeed while taking this medication and for 2 months after the last dose. This medication may cause infertility. Talk to your care team if you are concerned about your fertility. What side effects may I notice from receiving this medication? Side effects that you should report to your care team as soon as possible: Allergic reactions--skin rash,  itching, hives, swelling of the face, lips, tongue, or throat Dry cough, shortness of breath or trouble breathing Eye pain, redness, irritation, or discharge with blurry or decreased vision Infusion reactions--chest pain, shortness of breath or trouble breathing, feeling faint or lightheaded Low magnesium level--muscle pain or cramps, unusual weakness or fatigue, fast or irregular heartbeat, tremors Low potassium level--muscle pain or cramps, unusual weakness or fatigue, fast or irregular heartbeat, constipation Redness, blistering, peeling, or loosening of the skin, including inside the mouth Skin reactions on sun-exposed areas Side effects that usually do not require medical attention (report to your care team if they continue or are bothersome): Change in nail shape, thickness, or color Diarrhea Dry skin Fatigue Nausea Vomiting This list may not describe all possible side effects. Call your doctor for medical advice about side effects. You may report side effects to FDA at 1-800-FDA-1088. Where should I keep my medication? This medication is given in a hospital or clinic. It will not be stored at home. NOTE: This sheet is a summary. It may not cover all possible information. If you have questions about this medicine, talk to your doctor, pharmacist, or health care provider.  2024 Elsevier/Gold Standard (2021-11-22 00:00:00)  Oxaliplatin Injection What is this medication? OXALIPLATIN (ox AL i PLA tin) treats colorectal cancer. It works by slowing down the growth of cancer cells. This medicine may be used for other purposes; ask your health care provider or pharmacist if you have questions. COMMON BRAND NAME(S): Eloxatin What should I tell my care team before I take this medication? They need to know if you have any of these conditions: Heart disease History of irregular heartbeat or rhythm Liver disease Low blood cell levels (white cells, red cells, and platelets) Lung or breathing  disease, such as asthma Take medications that treat or prevent blood clots Tingling of the fingers, toes, or other nerve disorder An unusual or allergic reaction to oxaliplatin, other medications, foods, dyes, or preservatives If you or your partner are pregnant or trying to get pregnant Breast-feeding How should I use this medication? This medication is injected into a vein. It is given by your care team in a hospital or clinic setting. Talk to your care team about the use of this medication in children. Special care may be needed. Overdosage: If you think you have taken too much of this medicine contact a poison control center or emergency room at once. NOTE: This medicine is only for you. Do not share this medicine with others. What if I miss a dose? Keep appointments for follow-up doses. It is important not to miss a dose. Call your care team if you are unable to keep an  appointment. What may interact with this medication? Do not take this medication with any of the following: Cisapride Dronedarone Pimozide Thioridazine This medication may also interact with the following: Aspirin and aspirin-like medications Certain medications that treat or prevent blood clots, such as warfarin, apixaban, dabigatran, and rivaroxaban Cisplatin Cyclosporine Diuretics Medications for infection, such as acyclovir, adefovir, amphotericin B, bacitracin, cidofovir, foscarnet, ganciclovir, gentamicin, pentamidine, vancomycin NSAIDs, medications for pain and inflammation, such as ibuprofen or naproxen Other medications that cause heart rhythm changes Pamidronate Zoledronic acid This list may not describe all possible interactions. Give your health care provider a list of all the medicines, herbs, non-prescription drugs, or dietary supplements you use. Also tell them if you smoke, drink alcohol, or use illegal drugs. Some items may interact with your medicine. What should I watch for while using this  medication? Your condition will be monitored carefully while you are receiving this medication. You may need blood work while taking this medication. This medication may make you feel generally unwell. This is not uncommon as chemotherapy can affect healthy cells as well as cancer cells. Report any side effects. Continue your course of treatment even though you feel ill unless your care team tells you to stop. This medication may increase your risk of getting an infection. Call your care team for advice if you get a fever, chills, sore throat, or other symptoms of a cold or flu. Do not treat yourself. Try to avoid being around people who are sick. Avoid taking medications that contain aspirin, acetaminophen, ibuprofen, naproxen, or ketoprofen unless instructed by your care team. These medications may hide a fever. Be careful brushing or flossing your teeth or using a toothpick because you may get an infection or bleed more easily. If you have any dental work done, tell your dentist you are receiving this medication. This medication can make you more sensitive to cold. Do not drink cold drinks or use ice. Cover exposed skin before coming in contact with cold temperatures or cold objects. When out in cold weather wear warm clothing and cover your mouth and nose to warm the air that goes into your lungs. Tell your care team if you get sensitive to the cold. Talk to your care team if you or your partner are pregnant or think either of you might be pregnant. This medication can cause serious birth defects if taken during pregnancy and for 9 months after the last dose. A negative pregnancy test is required before starting this medication. A reliable form of contraception is recommended while taking this medication and for 9 months after the last dose. Talk to your care team about effective forms of contraception. Do not father a child while taking this medication and for 6 months after the last dose. Use a condom  while having sex during this time period. Do not breastfeed while taking this medication and for 3 months after the last dose. This medication may cause infertility. Talk to your care team if you are concerned about your fertility. What side effects may I notice from receiving this medication? Side effects that you should report to your care team as soon as possible: Allergic reactions--skin rash, itching, hives, swelling of the face, lips, tongue, or throat Bleeding--bloody or black, tar-like stools, vomiting blood or brown material that looks like coffee grounds, red or dark brown urine, small red or purple spots on skin, unusual bruising or bleeding Dry cough, shortness of breath or trouble breathing Heart rhythm changes--fast or irregular heartbeat, dizziness, feeling  faint or lightheaded, chest pain, trouble breathing Infection--fever, chills, cough, sore throat, wounds that don't heal, pain or trouble when passing urine, general feeling of discomfort or being unwell Liver injury--right upper belly pain, loss of appetite, nausea, light-colored stool, dark yellow or brown urine, yellowing skin or eyes, unusual weakness or fatigue Low red blood cell level--unusual weakness or fatigue, dizziness, headache, trouble breathing Muscle injury--unusual weakness or fatigue, muscle pain, dark yellow or brown urine, decrease in amount of urine Pain, tingling, or numbness in the hands or feet Sudden and severe headache, confusion, change in vision, seizures, which may be signs of posterior reversible encephalopathy syndrome (PRES) Unusual bruising or bleeding Side effects that usually do not require medical attention (report to your care team if they continue or are bothersome): Diarrhea Nausea Pain, redness, or swelling with sores inside the mouth or throat Unusual weakness or fatigue Vomiting This list may not describe all possible side effects. Call your doctor for medical advice about side effects.  You may report side effects to FDA at 1-800-FDA-1088. Where should I keep my medication? This medication is given in a hospital or clinic. It will not be stored at home. NOTE: This sheet is a summary. It may not cover all possible information. If you have questions about this medicine, talk to your doctor, pharmacist, or health care provider.  2024 Elsevier/Gold Standard (2022-04-24 00:00:00)  Leucovorin Injection What is this medication? LEUCOVORIN (loo koe VOR in) prevents side effects from certain medications, such as methotrexate. It works by increasing folate levels. This helps protect healthy cells in your body. It may also be used to treat anemia caused by low levels of folate. It can also be used with fluorouracil, a type of chemotherapy, to treat colorectal cancer. It works by increasing the effects of fluorouracil in the body. This medicine may be used for other purposes; ask your health care provider or pharmacist if you have questions. What should I tell my care team before I take this medication? They need to know if you have any of these conditions: Anemia from low levels of vitamin B12 in the blood An unusual or allergic reaction to leucovorin, folic acid, other medications, foods, dyes, or preservatives Pregnant or trying to get pregnant Breastfeeding How should I use this medication? This medication is injected into a vein or a muscle. It is given by your care team in a hospital or clinic setting. Talk to your care team about the use of this medication in children. Special care may be needed. Overdosage: If you think you have taken too much of this medicine contact a poison control center or emergency room at once. NOTE: This medicine is only for you. Do not share this medicine with others. What if I miss a dose? Keep appointments for follow-up doses. It is important not to miss your dose. Call your care team if you are unable to keep an appointment. What may interact with  this medication? Capecitabine Fluorouracil Phenobarbital Phenytoin Primidone Trimethoprim;sulfamethoxazole This list may not describe all possible interactions. Give your health care provider a list of all the medicines, herbs, non-prescription drugs, or dietary supplements you use. Also tell them if you smoke, drink alcohol, or use illegal drugs. Some items may interact with your medicine. What should I watch for while using this medication? Your condition will be monitored carefully while you are receiving this medication. This medication may increase the side effects of 5-fluorouracil. Tell your care team if you have diarrhea or mouth sores  that do not get better or that get worse. What side effects may I notice from receiving this medication? Side effects that you should report to your care team as soon as possible: Allergic reactions--skin rash, itching, hives, swelling of the face, lips, tongue, or throat This list may not describe all possible side effects. Call your doctor for medical advice about side effects. You may report side effects to FDA at 1-800-FDA-1088. Where should I keep my medication? This medication is given in a hospital or clinic. It will not be stored at home. NOTE: This sheet is a summary. It may not cover all possible information. If you have questions about this medicine, talk to your doctor, pharmacist, or health care provider.  2024 Elsevier/Gold Standard (2021-12-12 00:00:00)  Fluorouracil Injection What is this medication? FLUOROURACIL (flure oh YOOR a sil) treats some types of cancer. It works by slowing down the growth of cancer cells. This medicine may be used for other purposes; ask your health care provider or pharmacist if you have questions. COMMON BRAND NAME(S): Adrucil What should I tell my care team before I take this medication? They need to know if you have any of these conditions: Blood disorders Dihydropyrimidine dehydrogenase (DPD)  deficiency Infection, such as chickenpox, cold sores, herpes Kidney disease Liver disease Poor nutrition Recent or ongoing radiation therapy An unusual or allergic reaction to fluorouracil, other medications, foods, dyes, or preservatives If you or your partner are pregnant or trying to get pregnant Breast-feeding How should I use this medication? This medication is injected into a vein. It is administered by your care team in a hospital or clinic setting. Talk to your care team about the use of this medication in children. Special care may be needed. Overdosage: If you think you have taken too much of this medicine contact a poison control center or emergency room at once. NOTE: This medicine is only for you. Do not share this medicine with others. What if I miss a dose? Keep appointments for follow-up doses. It is important not to miss your dose. Call your care team if you are unable to keep an appointment. What may interact with this medication? Do not take this medication with any of the following: Live virus vaccines This medication may also interact with the following: Medications that treat or prevent blood clots, such as warfarin, enoxaparin, dalteparin This list may not describe all possible interactions. Give your health care provider a list of all the medicines, herbs, non-prescription drugs, or dietary supplements you use. Also tell them if you smoke, drink alcohol, or use illegal drugs. Some items may interact with your medicine. What should I watch for while using this medication? Your condition will be monitored carefully while you are receiving this medication. This medication may make you feel generally unwell. This is not uncommon as chemotherapy can affect healthy cells as well as cancer cells. Report any side effects. Continue your course of treatment even though you feel ill unless your care team tells you to stop. In some cases, you may be given additional medications  to help with side effects. Follow all directions for their use. This medication may increase your risk of getting an infection. Call your care team for advice if you get a fever, chills, sore throat, or other symptoms of a cold or flu. Do not treat yourself. Try to avoid being around people who are sick. This medication may increase your risk to bruise or bleed. Call your care team if you notice  any unusual bleeding. Be careful brushing or flossing your teeth or using a toothpick because you may get an infection or bleed more easily. If you have any dental work done, tell your dentist you are receiving this medication. Avoid taking medications that contain aspirin, acetaminophen, ibuprofen, naproxen, or ketoprofen unless instructed by your care team. These medications may hide a fever. Do not treat diarrhea with over the counter products. Contact your care team if you have diarrhea that lasts more than 2 days or if it is severe and watery. This medication can make you more sensitive to the sun. Keep out of the sun. If you cannot avoid being in the sun, wear protective clothing and sunscreen. Do not use sun lamps, tanning beds, or tanning booths. Talk to your care team if you or your partner wish to become pregnant or think you might be pregnant. This medication can cause serious birth defects if taken during pregnancy and for 3 months after the last dose. A reliable form of contraception is recommended while taking this medication and for 3 months after the last dose. Talk to your care team about effective forms of contraception. Do not father a child while taking this medication and for 3 months after the last dose. Use a condom while having sex during this time period. Do not breastfeed while taking this medication. This medication may cause infertility. Talk to your care team if you are concerned about your fertility. What side effects may I notice from receiving this medication? Side effects that you  should report to your care team as soon as possible: Allergic reactions--skin rash, itching, hives, swelling of the face, lips, tongue, or throat Heart attack--pain or tightness in the chest, shoulders, arms, or jaw, nausea, shortness of breath, cold or clammy skin, feeling faint or lightheaded Heart failure--shortness of breath, swelling of the ankles, feet, or hands, sudden weight gain, unusual weakness or fatigue Heart rhythm changes--fast or irregular heartbeat, dizziness, feeling faint or lightheaded, chest pain, trouble breathing High ammonia level--unusual weakness or fatigue, confusion, loss of appetite, nausea, vomiting, seizures Infection--fever, chills, cough, sore throat, wounds that don't heal, pain or trouble when passing urine, general feeling of discomfort or being unwell Low red blood cell level--unusual weakness or fatigue, dizziness, headache, trouble breathing Pain, tingling, or numbness in the hands or feet, muscle weakness, change in vision, confusion or trouble speaking, loss of balance or coordination, trouble walking, seizures Redness, swelling, and blistering of the skin over hands and feet Severe or prolonged diarrhea Unusual bruising or bleeding Side effects that usually do not require medical attention (report to your care team if they continue or are bothersome): Dry skin Headache Increased tears Nausea Pain, redness, or swelling with sores inside the mouth or throat Sensitivity to light Vomiting This list may not describe all possible side effects. Call your doctor for medical advice about side effects. You may report side effects to FDA at 1-800-FDA-1088. Where should I keep my medication? This medication is given in a hospital or clinic. It will not be stored at home. NOTE: This sheet is a summary. It may not cover all possible information. If you have questions about this medicine, talk to your doctor, pharmacist, or health care provider.  2024 Elsevier/Gold  Standard (2021-11-14 00:00:00)

## 2023-05-22 NOTE — Progress Notes (Signed)
Patient seen by Lonna Cobb NP today  Vitals are within treatment parameters:Yes   Labs are within treatment parameters: No (Please specify and give further instructions.)Plts 96 and Mag 1.5. it is ok proceed.  Treatment plan has been signed: Yes   Per physician team, Patient is ready for treatment. Please note the following modifications:adding 2g of IV Mag

## 2023-05-22 NOTE — Progress Notes (Signed)
[facility] OFFICE PROGRESS NOTE   Diagnosis:  Colon cancer  INTERVAL HISTORY:   [name] returns as scheduled.  He completed cycle 8 FOLFOX plus Panitumumab 05/08/2023.  He denies nausea/vomiting.  Single sore area at the right buccal mucosa.  No diarrhea.  Cold sensitivity is lasting about 5 to 6 days.  No persistent neuropathy symptoms.  Stable skin rash.  No bleeding.  He notes a small sore at the right heel and redness around several toenail beds on the right foot.  Objective:  Vital signs in last 24 hours:  Blood pressure 107/80, pulse 100, temperature 98.1 F (36.7 C), temperature source Temporal, resp. rate 18, height 5\' 6"  (1.676 m), weight 213 lb (96.6 kg), SpO2 100%.    HEENT: No thrush or ulcers. Resp: Lungs clear bilaterally. Cardio: Regular rate and rhythm. GI: No hepatosplenomegaly. Vascular: No leg edema. Skin: Palms and soles without erythema.  Small superficial linear abrasion right heel.  Several nailbeds on the right foot with mild erythema.  Acne type rash scattered over the trunk.  Skin on face appears dry. Port-A-Cath without erythema.  Lab Results:  Lab Results  Component Value Date   WBC 7.0 05/22/2023   HGB 15.0 05/22/2023   HCT 46.0 05/22/2023   MCV 90.7 05/22/2023   PLT 96 (L) 05/22/2023   NEUTROABS 3.8 05/22/2023    Imaging:  No results found.  Medications: I have reviewed the patient's current medications.  Assessment/Plan: Sigmoid colon mass, multiple liver lesions Ultrasound abdomen 01/08/2023-multifocal hyperechoic liver lesions, largest measuring 8.7 cm in the right hepatic lobe CT abdomen/pelvis 01/08/2023-multiple by lobar hypoattenuating hepatic lesions, masslike thickening of the sigmoid colon with abnormal spiculated pericolonic soft tissue.  IMA lymphadenopathy.  Subcentimeter pancreatic hypodensities without pancreatic duct dilation Biopsy liver lesion 01/22/2023-metastatic adenocarcinoma; foundation 1 microsatellite  stable, tumor mutation burden 4, ERBB2 amplification, K-ras wild-type, NRAS wild-type; preserved expression of the major MMR proteins. Colonoscopy 01/29/2023-malignant tumor in the sigmoid colon (invasive moderately differentiated adenocarcinoma), four 6-9 mm polyps in the rectum, ascending colon and cecum, one 14 mm polyp in the sigmoid colon (within the rectal polyp biopsies there are 2 similar-appearing fragments showing invasive tumor with associated adenomatous change which is felt to be carryover from the sigmoid mass biopsies, the rectal polyp biopsies also show multiple fragments of tubular adenoma without high-grade dysplasia) Cycle 1 FOLFOX 01/30/2023 Cycle 2 FOLFOX 02/12/2023 Cycle 3 FOLFOX plus Panitumumab 02/27/2023 Cycle 4 FOLFOX plus Panitumumab 03/13/2023 Cycle 5 FOLFOX plus panitumumab 03/27/2023 Cycle 6 FOLFOX plus Panitumumab 04/10/2023 CTs 04/19/2023-decrease size of hepatic metastases, no new lesions, decreased right upper quadrant and retroperitoneal lymph nodes, sigmoid colonic wall thickening no longer seen with a decrease in sigmoid mesenteric lymph nodes Cycle 7 FOLFOX plus panitumumab 04/24/2023, 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 Microcytic anemia secondary to #1  Disposition: Daniel [name] appears stable.  He has completed 8 cycles of FOLFOX plus Panitumumab.  He continues to tolerate treatment well.  There is no clinical evidence of disease progression.  Most recent CEA further improved.  Plan to proceed with cycle 9 today as scheduled.  CBC and chemistry panel reviewed.  Labs adequate to proceed as above.  He has stable mild thrombocytopenia.  He understands to contact the office with bleeding.  Magnesium level is mildly decreased.  He will increase  oral magnesium to twice  daily and will receive IV magnesium today.  He has mild paronychia related to Panitumumab.  We discussed Neosporin and Band-Aids.  He will return for follow-up and treatment in 2 weeks.  We are available to see him sooner if needed.  Lonna Cobb ANP/GNP-BC   05/22/2023  9:14 AM

## 2023-05-24 ENCOUNTER — Inpatient Hospital Stay: Payer: Medicare Other | Attending: Oncology

## 2023-05-24 VITALS — BP 91/63 | HR 99 | Temp 98.2°F | Resp 20

## 2023-05-24 DIAGNOSIS — C187 Malignant neoplasm of sigmoid colon: Secondary | ICD-10-CM | POA: Diagnosis not present

## 2023-05-24 DIAGNOSIS — D630 Anemia in neoplastic disease: Secondary | ICD-10-CM | POA: Diagnosis not present

## 2023-05-24 DIAGNOSIS — Z5111 Encounter for antineoplastic chemotherapy: Secondary | ICD-10-CM | POA: Insufficient documentation

## 2023-05-24 DIAGNOSIS — Z5112 Encounter for antineoplastic immunotherapy: Secondary | ICD-10-CM | POA: Diagnosis present

## 2023-05-24 DIAGNOSIS — C787 Secondary malignant neoplasm of liver and intrahepatic bile duct: Secondary | ICD-10-CM | POA: Diagnosis not present

## 2023-05-24 MED ORDER — HEPARIN SOD (PORK) LOCK FLUSH 100 UNIT/ML IV SOLN
500.0000 [IU] | Freq: Once | INTRAVENOUS | Status: AC | PRN
Start: 2023-05-24 — End: 2023-05-24
  Administered 2023-05-24: 500 [IU]

## 2023-05-24 MED ORDER — SODIUM CHLORIDE 0.9% FLUSH
10.0000 mL | INTRAVENOUS | Status: DC | PRN
  Administered 2023-05-24: 10 mL

## 2023-06-02 ENCOUNTER — Other Ambulatory Visit: Payer: Self-pay | Admitting: Oncology

## 2023-06-02 DIAGNOSIS — C187 Malignant neoplasm of sigmoid colon: Secondary | ICD-10-CM

## 2023-06-04 ENCOUNTER — Other Ambulatory Visit: Payer: Self-pay | Admitting: Nurse Practitioner

## 2023-06-04 DIAGNOSIS — C187 Malignant neoplasm of sigmoid colon: Secondary | ICD-10-CM

## 2023-06-05 ENCOUNTER — Encounter: Payer: Self-pay | Admitting: Nurse Practitioner

## 2023-06-05 ENCOUNTER — Inpatient Hospital Stay: Payer: Medicare Other

## 2023-06-05 ENCOUNTER — Other Ambulatory Visit (HOSPITAL_BASED_OUTPATIENT_CLINIC_OR_DEPARTMENT_OTHER): Payer: Self-pay

## 2023-06-05 ENCOUNTER — Encounter: Payer: Self-pay | Admitting: Oncology

## 2023-06-05 ENCOUNTER — Other Ambulatory Visit: Payer: Self-pay

## 2023-06-05 ENCOUNTER — Inpatient Hospital Stay (HOSPITAL_BASED_OUTPATIENT_CLINIC_OR_DEPARTMENT_OTHER): Payer: Medicare Other | Admitting: Nurse Practitioner

## 2023-06-05 VITALS — BP 111/65 | HR 114

## 2023-06-05 VITALS — BP 94/77 | HR 100 | Temp 98.1°F | Resp 18 | Ht 66.0 in | Wt 210.0 lb

## 2023-06-05 DIAGNOSIS — C187 Malignant neoplasm of sigmoid colon: Secondary | ICD-10-CM

## 2023-06-05 DIAGNOSIS — Z5112 Encounter for antineoplastic immunotherapy: Secondary | ICD-10-CM | POA: Diagnosis not present

## 2023-06-05 LAB — MAGNESIUM: Magnesium: 1.6 mg/dL — ABNORMAL LOW (ref 1.7–2.4)

## 2023-06-05 LAB — CBC WITH DIFFERENTIAL (CANCER CENTER ONLY)
Abs Immature Granulocytes: 0.02 10*3/uL (ref 0.00–0.07)
Basophils Absolute: 0.1 10*3/uL (ref 0.0–0.1)
Basophils Relative: 1 %
Eosinophils Absolute: 0.3 10*3/uL (ref 0.0–0.5)
Eosinophils Relative: 3 %
HCT: 46 % (ref 39.0–52.0)
Hemoglobin: 15.2 g/dL (ref 13.0–17.0)
Immature Granulocytes: 0 %
Lymphocytes Relative: 32 %
Lymphs Abs: 2.5 10*3/uL (ref 0.7–4.0)
MCH: 29.9 pg (ref 26.0–34.0)
MCHC: 33 g/dL (ref 30.0–36.0)
MCV: 90.6 fL (ref 80.0–100.0)
Monocytes Absolute: 1 10*3/uL (ref 0.1–1.0)
Monocytes Relative: 13 %
Neutro Abs: 3.9 10*3/uL (ref 1.7–7.7)
Neutrophils Relative %: 51 %
Platelet Count: 90 10*3/uL — ABNORMAL LOW (ref 150–400)
RBC: 5.08 MIL/uL (ref 4.22–5.81)
RDW: 18.2 % — ABNORMAL HIGH (ref 11.5–15.5)
WBC Count: 7.7 10*3/uL (ref 4.0–10.5)
nRBC: 0 % (ref 0.0–0.2)

## 2023-06-05 LAB — CMP (CANCER CENTER ONLY)
ALT: 30 U/L (ref 0–44)
AST: 35 U/L (ref 15–41)
Albumin: 3.9 g/dL (ref 3.5–5.0)
Alkaline Phosphatase: 115 U/L (ref 38–126)
Anion gap: 7 (ref 5–15)
BUN: 9 mg/dL (ref 6–20)
CO2: 28 mmol/L (ref 22–32)
Calcium: 8.7 mg/dL — ABNORMAL LOW (ref 8.9–10.3)
Chloride: 104 mmol/L (ref 98–111)
Creatinine: 0.63 mg/dL (ref 0.61–1.24)
GFR, Estimated: >60 mL/min (ref >60)
Glucose, Bld: 90 mg/dL (ref 70–99)
Potassium: 3.6 mmol/L (ref 3.5–5.1)
Sodium: 139 mmol/L (ref 135–145)
Total Bilirubin: 0.5 mg/dL (ref <1.2)
Total Protein: 7 g/dL (ref 6.5–8.1)

## 2023-06-05 LAB — CEA (ACCESS): CEA (CHCC): 4.63 ng/mL (ref 0.00–5.00)

## 2023-06-05 MED ORDER — MAGNESIUM SULFATE 2 GM/50ML IV SOLN
2.0000 g | Freq: Once | INTRAVENOUS | Status: AC
  Administered 2023-06-05: 2 g via INTRAVENOUS
  Filled 2023-06-05: qty 50

## 2023-06-05 MED ORDER — DEXTROSE 5 % IV SOLN: Freq: Once | INTRAVENOUS | Status: AC

## 2023-06-05 MED ORDER — FLUOROURACIL CHEMO INJECTION 5 GM/100ML
2400.0000 mg/m2 | INTRAVENOUS | Status: DC
  Administered 2023-06-05: 5000 mg via INTRAVENOUS
  Filled 2023-06-05: qty 100

## 2023-06-05 MED ORDER — DEXTROSE 5 % IV SOLN
65.0000 mg/m2 | Freq: Once | INTRAVENOUS | Status: AC
  Administered 2023-06-05: 150 mg via INTRAVENOUS
  Filled 2023-06-05: qty 20

## 2023-06-05 MED ORDER — SODIUM CHLORIDE 0.9 % IV SOLN
10.0000 mg | Freq: Once | INTRAVENOUS | Status: DC
Start: 2023-06-05 — End: 2023-06-05
  Filled 2023-06-05: qty 1

## 2023-06-05 MED ORDER — DEXAMETHASONE SODIUM PHOSPHATE 10 MG/ML IJ SOLN
10.0000 mg | Freq: Once | INTRAMUSCULAR | Status: AC
Start: 2023-06-05 — End: 2023-06-05
  Administered 2023-06-05: 10 mg via INTRAVENOUS
  Filled 2023-06-05: qty 1

## 2023-06-05 MED ORDER — PALONOSETRON HCL INJECTION 0.25 MG/5ML
0.2500 mg | Freq: Once | INTRAVENOUS | Status: AC
  Administered 2023-06-05: 0.25 mg via INTRAVENOUS
  Filled 2023-06-05: qty 5

## 2023-06-05 MED ORDER — DEXTROSE 5 % IV SOLN
400.0000 mg/m2 | Freq: Once | INTRAVENOUS | Status: AC
  Administered 2023-06-05: 844 mg via INTRAVENOUS
  Filled 2023-06-05: qty 42.2

## 2023-06-05 MED ORDER — FLUOROURACIL CHEMO INJECTION 2.5 GM/50ML
400.0000 mg/m2 | Freq: Once | INTRAVENOUS | Status: AC
Start: 2023-06-05 — End: 2023-06-05
  Administered 2023-06-05: 850 mg via INTRAVENOUS
  Filled 2023-06-05: qty 17

## 2023-06-05 MED ORDER — SODIUM CHLORIDE 0.9 % IV SOLN: Freq: Once | INTRAVENOUS | Status: AC

## 2023-06-05 MED ORDER — SODIUM CHLORIDE 0.9 % IV SOLN
6.0000 mg/kg | Freq: Once | INTRAVENOUS | Status: AC
  Administered 2023-06-05: 600 mg via INTRAVENOUS
  Filled 2023-06-05: qty 20

## 2023-06-05 NOTE — Patient Instructions (Addendum)
[facility] - A DEPT OF [facility]  The chemotherapy medication bag should finish at 46 hours, 96 hours, or 7 days. For example, if your pump is scheduled for 46 hours and it was put on at 4:00 p.m., it should finish at 2:00 p.m. the day it is scheduled to come off regardless of your appointment time.     Estimated time to finish at 1:00 Friday, June 07, 2023.   If the display on your pump reads "Low Volume" and it is beeping, take the batteries out of the pump and come to the cancer center for it to be taken off.   If the pump alarms go off prior to the pump reading "Low Volume" then call [phone] and someone can assist you.  If the plunger comes out and the chemotherapy medication is leaking out, please use your home chemo spill kit to clean up the spill. Do NOT use paper towels or other household products.  If you have problems or questions regarding your pump, please call either [phone] (24 hours a day) or the cancer center Monday-Friday 8:00 a.m.- 4:30 p.m. at the clinic number and we will assist you. If you are unable to get assistance, then go to the nearest Emergency Department and ask the staff to contact the IV team for assistance.  Discharge Instructions: Thank you for choosing [facility] to provide your oncology and hematology care.   If you have a lab appointment with the Cancer Center, please go directly to the Cancer Center and check in at the registration area.   Wear comfortable clothing and clothing appropriate for easy access to any Portacath or PICC line.   We strive to give you quality time with your provider. You may need to reschedule your appointment if you arrive late (15 or more minutes).  Arriving late affects you and other patients whose appointments are after yours.  Also, if you miss three or more appointments without notifying the office, you may be dismissed from the clinic at the provider's  discretion.      For prescription refill requests, have your pharmacy contact our office and allow 72 hours for refills to be completed.    Today you received the following chemotherapy and/or immunotherapy agents Vectibix/Oxaliplatin/Leucovorin/Fluorouracil      To help prevent nausea and vomiting after your treatment, we encourage you to take your nausea medication as directed.  BELOW ARE SYMPTOMS THAT SHOULD BE REPORTED IMMEDIATELY: *FEVER GREATER THAN 100.4 F (38 C) OR HIGHER *CHILLS OR SWEATING *NAUSEA AND VOMITING THAT IS NOT CONTROLLED WITH YOUR NAUSEA MEDICATION *UNUSUAL SHORTNESS OF BREATH *UNUSUAL BRUISING OR BLEEDING *URINARY PROBLEMS (pain or burning when urinating, or frequent urination) *BOWEL PROBLEMS (unusual diarrhea, constipation, pain near the anus) TENDERNESS IN MOUTH AND THROAT WITH OR WITHOUT PRESENCE OF ULCERS (sore throat, sores in mouth, or a toothache) UNUSUAL RASH, SWELLING OR PAIN  UNUSUAL VAGINAL DISCHARGE OR ITCHING   Items with * indicate a potential emergency and should be followed up as soon as possible or go to the Emergency Department if any problems should occur.  Please show the CHEMOTHERAPY ALERT CARD or IMMUNOTHERAPY ALERT CARD at check-in to the Emergency Department and triage nurse.  Should you have questions after your visit or need to cancel or reschedule your appointment, please contact [facility] - A DEPT [facility]  Dept: [phone]  and follow the prompts.  Office hours are 8:00  a.m. to 4:30 p.m. Monday - Friday. Please note that voicemails left after 4:00 p.m. may not be returned until the following business day.  We are closed weekends and major holidays. You have access to a nurse at all times for urgent questions. Please call the main number to the clinic Dept: [phone] and follow the prompts.   For any non-urgent questions, you may also contact your provider using MyChart. We now offer  e-Visits for anyone 15 and older to request care online for non-urgent symptoms. For details visit [email or web address].   Also download the MyChart app! Go to the app store, search "MyChart", open the app, select [facility], and log in with your MyChart username and password.

## 2023-06-05 NOTE — Progress Notes (Signed)
[facility] OFFICE PROGRESS NOTE   Diagnosis: Colon cancer  INTERVAL HISTORY:   Daniel [name] returns as scheduled.  He completed cycle 9 FOLFOX plus Panitumumab 05/22/2023.  He denies nausea/vomiting.  Single mouth sore, similar to previous cycles.  No diarrhea.  Cold sensitivity lasted 4 to 5 days.  Stable skin rash.  Objective:  Vital signs in last 24 hours:  Blood pressure 94/77, pulse 100, temperature 98.1 F (36.7 C), temperature source Temporal, resp. rate 18, height 5\' 6"  (1.676 m), weight 210 lb (95.3 kg), SpO2 99%.    HEENT: No thrush.  Healing ulceration left buccal mucosa.  No other ulcerations. Resp: Lungs clear bilaterally. Cardio: Regular rate and rhythm. GI: Abdomen soft and nontender.  No hepatosplenomegaly. Vascular: No leg edema. Neuro: Vibratory sense minimally decreased over the fingertips per tuning fork exam. Skin: Palms without erythema. Port-A-Cath without erythema.  Lab Results:  Lab Results  Component Value Date   WBC 7.7 06/05/2023   HGB 15.2 06/05/2023   HCT 46.0 06/05/2023   MCV 90.6 06/05/2023   PLT 90 (L) 06/05/2023   NEUTROABS 3.9 06/05/2023    Imaging:  No results found.  Medications: I have reviewed the patient's current medications.  Assessment/Plan: Sigmoid colon mass, multiple liver lesions Ultrasound abdomen 01/08/2023-multifocal hyperechoic liver lesions, largest measuring 8.7 cm in the right hepatic lobe CT abdomen/pelvis 01/08/2023-multiple by lobar hypoattenuating hepatic lesions, masslike thickening of the sigmoid colon with abnormal spiculated pericolonic soft tissue.  IMA lymphadenopathy.  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 Colonoscopy 01/29/2023-malignant tumor  in the sigmoid colon (invasive moderately differentiated adenocarcinoma), four 6-9 mm polyps in the rectum, ascending colon and cecum, one 14 mm polyp in the sigmoid colon (within the rectal polyp biopsies there are 2 similar-appearing fragments showing invasive tumor with associated adenomatous change which is felt to be carryover from the sigmoid mass biopsies, the rectal polyp biopsies also show multiple fragments of tubular adenoma without high-grade dysplasia) Cycle 1 FOLFOX 01/30/2023 Cycle 2 FOLFOX 02/12/2023 Cycle 3 FOLFOX plus Panitumumab 02/27/2023 Cycle 4 FOLFOX plus Panitumumab 03/13/2023 Cycle 5 FOLFOX plus panitumumab 03/27/2023 Cycle 6 FOLFOX plus Panitumumab 04/10/2023 CTs 04/19/2023-decrease size of hepatic metastases, no new lesions, decreased right upper quadrant and retroperitoneal lymph nodes, sigmoid colonic wall thickening no longer seen with a decrease in sigmoid mesenteric lymph nodes Cycle 7 FOLFOX plus panitumumab 04/24/2023, 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 Microcytic anemia secondary to #1  Disposition: Daniel [name] appears stable.  He has completed 9 cycles of FOLFOX plus Panitumumab.  There is no clinical evidence of disease progression.  Most recent CEA further improved.  Plan to proceed with cycle 10 today as scheduled.  CBC and chemistry panel reviewed.  Labs adequate to proceed as above.  He has stable mild thrombocytopenia.  He understands to contact the office with bleeding.  Magnesium is mildly decreased.  He will receive IV magnesium today.  He has a single mouth sore, reports this is similar to previous cycles.  We are sending a prescription for Magic mouthwash to his pharmacy.  He  understands to contact the office with worsening/difficulty with eating or drinking.  He will return  for follow-up and treatment in 2 weeks.  We are available to see him sooner if needed.  Lonna Cobb ANP/GNP-BC   06/05/2023  9:01 AM

## 2023-06-05 NOTE — Progress Notes (Signed)
Patient seen by Lonna Cobb NP today  Vitals are within treatment parameters:No (Please specify and give further instructions.) 94/77  Labs are within treatment parameters: No (Please specify and give further instructions.) Mag 1.6 and plts 90  Treatment plan has been signed: Yes   Per physician team, Patient is ready for treatment and there are NO modifications to the treatment plan. Patient will be receiving  2 gram of mag.

## 2023-06-06 ENCOUNTER — Other Ambulatory Visit: Payer: Self-pay

## 2023-06-07 ENCOUNTER — Inpatient Hospital Stay: Payer: Medicare Other

## 2023-06-07 VITALS — BP 107/76 | HR 94 | Temp 97.6°F | Resp 18

## 2023-06-07 DIAGNOSIS — Z5112 Encounter for antineoplastic immunotherapy: Secondary | ICD-10-CM | POA: Diagnosis not present

## 2023-06-07 DIAGNOSIS — C187 Malignant neoplasm of sigmoid colon: Secondary | ICD-10-CM

## 2023-06-07 MED ORDER — SODIUM CHLORIDE 0.9% FLUSH
10.0000 mL | INTRAVENOUS | Status: DC | PRN
Start: 2023-06-07 — End: 2023-06-07
  Administered 2023-06-07: 10 mL

## 2023-06-07 MED ORDER — HEPARIN SOD (PORK) LOCK FLUSH 100 UNIT/ML IV SOLN
500.0000 [IU] | Freq: Once | INTRAVENOUS | Status: AC | PRN
  Administered 2023-06-07: 500 [IU]

## 2023-06-07 NOTE — Patient Instructions (Signed)

## 2023-06-15 ENCOUNTER — Other Ambulatory Visit: Payer: Self-pay | Admitting: Oncology

## 2023-06-19 ENCOUNTER — Inpatient Hospital Stay: Payer: Medicare Other

## 2023-06-19 ENCOUNTER — Inpatient Hospital Stay (HOSPITAL_BASED_OUTPATIENT_CLINIC_OR_DEPARTMENT_OTHER): Payer: Medicare Other | Admitting: Oncology

## 2023-06-19 ENCOUNTER — Other Ambulatory Visit: Payer: Self-pay | Admitting: Pharmacist

## 2023-06-19 VITALS — BP 94/72 | HR 99 | Temp 98.2°F | Resp 18 | Ht 66.0 in | Wt 207.0 lb

## 2023-06-19 DIAGNOSIS — C187 Malignant neoplasm of sigmoid colon: Secondary | ICD-10-CM

## 2023-06-19 DIAGNOSIS — Z5112 Encounter for antineoplastic immunotherapy: Secondary | ICD-10-CM | POA: Diagnosis not present

## 2023-06-19 LAB — CBC WITH DIFFERENTIAL (CANCER CENTER ONLY)
Abs Immature Granulocytes: 0.02 10*3/uL (ref 0.00–0.07)
Basophils Absolute: 0.1 10*3/uL (ref 0.0–0.1)
Basophils Relative: 1 %
Eosinophils Absolute: 0.2 10*3/uL (ref 0.0–0.5)
Eosinophils Relative: 4 %
HCT: 47.2 % (ref 39.0–52.0)
Hemoglobin: 15.5 g/dL (ref 13.0–17.0)
Immature Granulocytes: 0 %
Lymphocytes Relative: 35 %
Lymphs Abs: 2.4 10*3/uL (ref 0.7–4.0)
MCH: 30.7 pg (ref 26.0–34.0)
MCHC: 32.8 g/dL (ref 30.0–36.0)
MCV: 93.5 fL (ref 80.0–100.0)
Monocytes Absolute: 0.9 10*3/uL (ref 0.1–1.0)
Monocytes Relative: 13 %
Neutro Abs: 3.3 10*3/uL (ref 1.7–7.7)
Neutrophils Relative %: 47 %
Platelet Count: 91 10*3/uL — ABNORMAL LOW (ref 150–400)
RBC: 5.05 MIL/uL (ref 4.22–5.81)
RDW: 18.4 % — ABNORMAL HIGH (ref 11.5–15.5)
WBC Count: 6.8 10*3/uL (ref 4.0–10.5)
nRBC: 0 % (ref 0.0–0.2)

## 2023-06-19 LAB — CMP (CANCER CENTER ONLY)
ALT: 33 U/L (ref 0–44)
AST: 38 U/L (ref 15–41)
Albumin: 3.9 g/dL (ref 3.5–5.0)
Alkaline Phosphatase: 124 U/L (ref 38–126)
Anion gap: 11 (ref 5–15)
BUN: 10 mg/dL (ref 6–20)
CO2: 27 mmol/L (ref 22–32)
Calcium: 8.7 mg/dL — ABNORMAL LOW (ref 8.9–10.3)
Chloride: 103 mmol/L (ref 98–111)
Creatinine: 0.68 mg/dL (ref 0.61–1.24)
GFR, Estimated: >60 mL/min (ref >60)
Glucose, Bld: 136 mg/dL — ABNORMAL HIGH (ref 70–99)
Potassium: 3.3 mmol/L — ABNORMAL LOW (ref 3.5–5.1)
Sodium: 141 mmol/L (ref 135–145)
Total Bilirubin: 0.7 mg/dL (ref <1.2)
Total Protein: 7.1 g/dL (ref 6.5–8.1)

## 2023-06-19 LAB — CEA (ACCESS): CEA (CHCC): 4.59 ng/mL (ref 0.00–5.00)

## 2023-06-19 LAB — MAGNESIUM: Magnesium: 1.6 mg/dL — ABNORMAL LOW (ref 1.7–2.4)

## 2023-06-19 MED ORDER — MAGNESIUM SULFATE 2 GM/50ML IV SOLN
2.0000 g | Freq: Once | INTRAVENOUS | Status: AC
  Administered 2023-06-19: 2 g via INTRAVENOUS
  Filled 2023-06-19: qty 50

## 2023-06-19 MED ORDER — SODIUM CHLORIDE 0.9 % IV SOLN
2400.0000 mg/m2 | INTRAVENOUS | Status: DC
  Administered 2023-06-19: 5000 mg via INTRAVENOUS
  Filled 2023-06-19: qty 100

## 2023-06-19 MED ORDER — SODIUM CHLORIDE 0.9 % IV SOLN: Freq: Once | INTRAVENOUS | Status: AC

## 2023-06-19 MED ORDER — LEUCOVORIN CALCIUM INJECTION 350 MG
400.0000 mg/m2 | Freq: Once | INTRAVENOUS | Status: AC
  Administered 2023-06-19: 836 mg via INTRAVENOUS
  Filled 2023-06-19: qty 41.8

## 2023-06-19 MED ORDER — OXALIPLATIN CHEMO INJECTION 50 MG/10ML
65.0000 mg/m2 | Freq: Once | INTRAVENOUS | Status: AC
  Administered 2023-06-19: 150 mg via INTRAVENOUS
  Filled 2023-06-19: qty 20

## 2023-06-19 MED ORDER — SODIUM CHLORIDE 0.9 % IV SOLN
6.0000 mg/kg | Freq: Once | INTRAVENOUS | Status: AC
  Administered 2023-06-19: 600 mg via INTRAVENOUS
  Filled 2023-06-19: qty 20

## 2023-06-19 MED ORDER — POTASSIUM CHLORIDE CRYS ER 20 MEQ PO TBCR: 20.0000 meq | EXTENDED_RELEASE_TABLET | Freq: Two times a day (BID) | ORAL | 2 refills | Status: DC

## 2023-06-19 MED ORDER — PALONOSETRON HCL INJECTION 0.25 MG/5ML
0.2500 mg | Freq: Once | INTRAVENOUS | Status: AC
  Administered 2023-06-19: 0.25 mg via INTRAVENOUS
  Filled 2023-06-19: qty 5

## 2023-06-19 MED ORDER — DEXAMETHASONE SODIUM PHOSPHATE 10 MG/ML IJ SOLN
10.0000 mg | Freq: Once | INTRAMUSCULAR | Status: AC
  Administered 2023-06-19: 10 mg via INTRAVENOUS
  Filled 2023-06-19: qty 1

## 2023-06-19 MED ORDER — LEUCOVORIN CALCIUM INJECTION 350 MG
400.0000 mg/m2 | Freq: Once | INTRAVENOUS | Status: DC
  Filled 2023-06-19: qty 41.8

## 2023-06-19 MED ORDER — DEXTROSE 5 % IV SOLN: Freq: Once | INTRAVENOUS | Status: AC

## 2023-06-19 MED ORDER — FLUOROURACIL CHEMO INJECTION 2.5 GM/50ML
400.0000 mg/m2 | Freq: Once | INTRAVENOUS | Status: AC
  Administered 2023-06-19: 850 mg via INTRAVENOUS
  Filled 2023-06-19: qty 17

## 2023-06-19 NOTE — Progress Notes (Signed)
[name] OFFICE PROGRESS NOTE   Diagnosis: Colon cancer  INTERVAL HISTORY:   Daniel [name] completed another cycle of FOLFOX/panitumumab 06/05/2023.  He reports cold sensitivity for 5 days following chemotherapy.  No neuropathy symptoms are present.  He had 1 episode of nausea over the past 2 weeks.  No diarrhea.  Mouth soreness was relieved with Magic mouthwash.  No difficulty with bowel function.  Objective:  Vital signs in last 24 hours:  Blood pressure 94/72, pulse 99, temperature 98.2 F (36.8 C), temperature source Temporal, resp. rate 18, height 5\' 6"  (1.676 m), weight 207 lb (93.9 kg), SpO2 99%.    HEENT: No thrush.  Few petechiae at the buccal mucosa bilaterally, no discrete ulcers Resp: Lungs clear bilaterally Cardio: Regular rhythm, distant heart sounds GI: Soft and nontender, no hepatosplenomegaly Vascular: No leg edema Neuro: Mild loss of vibratory sense at the fingertips bilaterally Skin: Dry acne type rash over the trunk  Portacath/PICC-without erythema  Lab Results:  Lab Results  Component Value Date   WBC 6.8 06/19/2023   HGB 15.5 06/19/2023   HCT 47.2 06/19/2023   MCV 93.5 06/19/2023   PLT 91 (L) 06/19/2023   NEUTROABS 3.3 06/19/2023    CMP  Lab Results  Component Value Date   NA 139 06/05/2023   K 3.6 06/05/2023   CL 104 06/05/2023   CO2 28 06/05/2023   GLUCOSE 90 06/05/2023   BUN 9 06/05/2023   CREATININE 0.63 06/05/2023   CALCIUM 8.7 (L) 06/05/2023   PROT 7.0 06/05/2023   ALBUMIN 3.9 06/05/2023   AST 35 06/05/2023   ALT 30 06/05/2023   ALKPHOS 115 06/05/2023   BILITOT 0.5 06/05/2023   GFRNONAA >60 06/05/2023   GFRAA >60 07/23/2016    Lab Results  Component Value Date   CEA 4.63 06/05/2023     Medications: I have reviewed the patient's current medications.   Assessment/Plan: Sigmoid colon mass, multiple liver lesions Ultrasound abdomen 01/08/2023-multifocal hyperechoic liver lesions, largest measuring 8.7 cm in  the right hepatic lobe CT abdomen/pelvis 01/08/2023-multiple by lobar hypoattenuating hepatic lesions, masslike thickening of the sigmoid colon with abnormal spiculated pericolonic soft tissue.  IMA lymphadenopathy.  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 Colonoscopy 01/29/2023-malignant tumor in the sigmoid colon (invasive moderately differentiated adenocarcinoma), four 6-9 mm polyps in the rectum, ascending colon and cecum, one 14 mm polyp in the sigmoid colon (within the rectal polyp biopsies there are 2 similar-appearing fragments showing invasive tumor with associated adenomatous change which is felt to be carryover from the sigmoid mass biopsies, the rectal polyp biopsies also show multiple fragments of tubular adenoma without high-grade dysplasia) Cycle 1 FOLFOX 01/30/2023 Cycle 2 FOLFOX 02/12/2023 Cycle 3 FOLFOX plus Panitumumab 02/27/2023 Cycle 4 FOLFOX plus Panitumumab 03/13/2023 Cycle 5 FOLFOX plus panitumumab 03/27/2023 Cycle 6 FOLFOX plus Panitumumab 04/10/2023 CTs 04/19/2023-decrease size of hepatic metastases, no new lesions, decreased right upper quadrant and retroperitoneal lymph nodes, sigmoid colonic wall thickening no longer seen with a decrease in sigmoid mesenteric lymph nodes Cycle 7 FOLFOX plus panitumumab 04/24/2023, 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 Microcytic anemia secondary to #1  Disposition: Daniel [name]  appears stable.  He continues to tolerate the chemotherapy well.  He will complete another cycle today.  He will return for an office visit and cycle 12 chemotherapy in 2 weeks.  We will hold oxaliplatin if he develops progressive neuropathy symptoms.  He will undergo a restaging CT evaluation after cycle 12.  Thornton Papas, MD  06/19/2023  8:59 AM

## 2023-06-19 NOTE — Progress Notes (Signed)
Keep oxaliplatin dose at 150 mg this cycle despite weight loss per Dr. Truett Perna.  He will consider a dose change in the future if patient has further weight loss.

## 2023-06-19 NOTE — Addendum Note (Signed)
Addended by: Wandalee Ferdinand on: 06/19/2023 09:42 AM   Modules accepted: Orders

## 2023-06-19 NOTE — Progress Notes (Signed)
Patient seen by Dr. Thornton Papas today  Vitals are within treatment parameters:Yes   Labs are within treatment parameters: No (Please specify and give further instructions.) Platelets 91,000--OK to proceed; Mg+ 1.6--proceed; K+ 3.3--proceed  Treatment plan has been signed: Yes   Per physician team, Patient is ready for treatment. Please note the following modifications: Please give 2 grams IV Mg+ today and patient will increase oral K+ to 20 meq bid.

## 2023-06-19 NOTE — Patient Instructions (Signed)
[facility] - A DEPT OF [facility]   Discharge Instructions: Thank you for choosing [facility] to provide your oncology and hematology care.   If you have a lab appointment with the Cancer Center, please go directly to the Cancer Center and check in at the registration area.   Wear comfortable clothing and clothing appropriate for easy access to any Portacath or PICC line.   We strive to give you quality time with your provider. You may need to reschedule your appointment if you arrive late (15 or more minutes).  Arriving late affects you and other patients whose appointments are after yours.  Also, if you miss three or more appointments without notifying the office, you may be dismissed from the clinic at the provider's discretion.      For prescription refill requests, have your pharmacy contact our office and allow 72 hours for refills to be completed.    Today you received the following chemotherapy and/or immunotherapy agents Panitumumab (VECTIBIX), Oxaliplatin (ELOXATIN), Leucovorin & Flourouracil (ADRUCIL).      To help prevent nausea and vomiting after your treatment, we encourage you to take your nausea medication as directed.  BELOW ARE SYMPTOMS THAT SHOULD BE REPORTED IMMEDIATELY: *FEVER GREATER THAN 100.4 F (38 C) OR HIGHER *CHILLS OR SWEATING *NAUSEA AND VOMITING THAT IS NOT CONTROLLED WITH YOUR NAUSEA MEDICATION *UNUSUAL SHORTNESS OF BREATH *UNUSUAL BRUISING OR BLEEDING *URINARY PROBLEMS (pain or burning when urinating, or frequent urination) *BOWEL PROBLEMS (unusual diarrhea, constipation, pain near the anus) TENDERNESS IN MOUTH AND THROAT WITH OR WITHOUT PRESENCE OF ULCERS (sore throat, sores in mouth, or a toothache) UNUSUAL RASH, SWELLING OR PAIN  UNUSUAL VAGINAL DISCHARGE OR ITCHING   Items with * indicate a potential emergency and should be followed up as soon as possible or go to the Emergency Department if any  problems should occur.  Please show the CHEMOTHERAPY ALERT CARD or IMMUNOTHERAPY ALERT CARD at check-in to the Emergency Department and triage nurse.  Should you have questions after your visit or need to cancel or reschedule your appointment, please contact [facility] - [facility] DE[facility]  Dept: [phone]  and follow the prompts.  Office hours are 8:00 a.m. to 4:30 p.m. Monday - Friday. Please note that voicemails left after 4:00 p.m. may not be returned until the following business day.  We are closed weekends and major holidays. You have access to a nurse at all times for urgent questions. Please call the main number to the clinic Dept: [phone] and follow the prompts.   For any non-urgent questions, you may also contact your provider using MyChart. We now offer e-Visits for anyone 10 and older to request care online for non-urgent symptoms. For details visit [email or web address].   Also download the MyChart app! Go to the app store, search "MyChart", open the app, select [place], and log in with your MyChart username and password.  Panitumumab Injection What is this medication? PANITUMUMAB (pan i TOOM ue mab) treats colorectal cancer. It works by blocking a protein that causes cancer cells to grow and multiply. This helps to slow or stop the spread of cancer cells. It is a monoclonal antibody. This medicine may be used for other purposes; ask your health care provider or pharmacist if you have questions. COMMON BRAND NAME(S): Vectibix What should I tell my care team before I take this medication? They need to know if you have any  of these conditions: Eye disease Low levels of magnesium in the blood Lung disease An unusual or allergic reaction to panitumumab, other medications, foods, dyes, or preservatives Pregnant or trying to get pregnant Breast-feeding How should I use this medication? This medication is injected into a vein. It is  given by your care team in a hospital or clinic setting. Talk to your care team about the use of this medication in children. Special care may be needed. Overdosage: If you think you have taken too much of this medicine contact a poison control center or emergency room at once. NOTE: This medicine is only for you. Do not share this medicine with others. What if I miss a dose? Keep appointments for follow-up doses. It is important not to miss your dose. Call your care team if you are unable to keep an appointment. What may interact with this medication? Bevacizumab This list may not describe all possible interactions. Give your health care provider a list of all the medicines, herbs, non-prescription drugs, or dietary supplements you use. Also tell them if you smoke, drink alcohol, or use illegal drugs. Some items may interact with your medicine. What should I watch for while using this medication? Your condition will be monitored carefully while you are receiving this medication. This medication may make you feel generally unwell. This is not uncommon as chemotherapy can affect healthy cells as well as cancer cells. Report any side effects. Continue your course of treatment even though you feel ill unless your care team tells you to stop. This medication can make you more sensitive to the sun. Keep out of the sun while receiving this medication and for 2 months after stopping therapy. If you cannot avoid being in the sun, wear protective clothing and sunscreen. Do not use sun lamps, tanning beds, or tanning booths. Check with your care team if you have severe diarrhea, nausea, and vomiting or if you sweat a lot. The loss of too much body fluid may make it dangerous for you to take this medication. This medication may cause serious skin reactions. They can happen weeks to months after starting the medication. Contact your care team right away if you notice fevers or flu-like symptoms with a rash. The  rash may be red or purple and then turn into blisters or peeling of the skin. You may also notice a red rash with swelling of the face, lips, or lymph nodes in your neck or under your arms. Talk to your care team if you may be pregnant. Serious birth defects can occur if you take this medication during pregnancy and for 2 months after the last dose. Contraception is recommended while taking this medication and for 2 months after the last dose. Your care team can help you find the option that works for you. Do not breastfeed while taking this medication and for 2 months after the last dose. This medication may cause infertility. Talk to your care team if you are concerned about your fertility. What side effects may I notice from receiving this medication? Side effects that you should report to your care team as soon as possible: Allergic reactions--skin rash, itching, hives, swelling of the face, lips, tongue, or throat Dry cough, shortness of breath or trouble breathing Eye pain, redness, irritation, or discharge with blurry or decreased vision Infusion reactions--chest pain, shortness of breath or trouble breathing, feeling faint or lightheaded Low magnesium level--muscle pain or cramps, unusual weakness or fatigue, fast or irregular heartbeat, tremors Low potassium  level--muscle pain or cramps, unusual weakness or fatigue, fast or irregular heartbeat, constipation Redness, blistering, peeling, or loosening of the skin, including inside the mouth Skin reactions on sun-exposed areas Side effects that usually do not require medical attention (report to your care team if they continue or are bothersome): Change in nail shape, thickness, or color Diarrhea Dry skin Fatigue Nausea Vomiting This list may not describe all possible side effects. Call your doctor for medical advice about side effects. You may report side effects to FDA at 1-800-FDA-1088. Where should I keep my medication? This  medication is given in a hospital or clinic. It will not be stored at home. NOTE: This sheet is a summary. It may not cover all possible information. If you have questions about this medicine, talk to your doctor, pharmacist, or health care provider.  2024 Elsevier/Gold Standard (2021-11-22 00:00:00)   Oxaliplatin Injection What is this medication? OXALIPLATIN (ox AL i PLA tin) treats colorectal cancer. It works by slowing down the growth of cancer cells. This medicine may be used for other purposes; ask your health care provider or pharmacist if you have questions. COMMON BRAND NAME(S): Eloxatin What should I tell my care team before I take this medication? They need to know if you have any of these conditions: Heart disease History of irregular heartbeat or rhythm Liver disease Low blood cell levels (white cells, red cells, and platelets) Lung or breathing disease, such as asthma Take medications that treat or prevent blood clots Tingling of the fingers, toes, or other nerve disorder An unusual or allergic reaction to oxaliplatin, other medications, foods, dyes, or preservatives If you or your partner are pregnant or trying to get pregnant Breast-feeding How should I use this medication? This medication is injected into a vein. It is given by your care team in a hospital or clinic setting. Talk to your care team about the use of this medication in children. Special care may be needed. Overdosage: If you think you have taken too much of this medicine contact a poison control center or emergency room at once. NOTE: This medicine is only for you. Do not share this medicine with others. What if I miss a dose? Keep appointments for follow-up doses. It is important not to miss a dose. Call your care team if you are unable to keep an appointment. What may interact with this medication? Do not take this medication with any of the  following: Cisapride Dronedarone Pimozide Thioridazine This medication may also interact with the following: Aspirin and aspirin-like medications Certain medications that treat or prevent blood clots, such as warfarin, apixaban, dabigatran, and rivaroxaban Cisplatin Cyclosporine Diuretics Medications for infection, such as acyclovir, adefovir, amphotericin B, bacitracin, cidofovir, foscarnet, ganciclovir, gentamicin, pentamidine, vancomycin NSAIDs, medications for pain and inflammation, such as ibuprofen or naproxen Other medications that cause heart rhythm changes Pamidronate Zoledronic acid This list may not describe all possible interactions. Give your health care provider a list of all the medicines, herbs, non-prescription drugs, or dietary supplements you use. Also tell them if you smoke, drink alcohol, or use illegal drugs. Some items may interact with your medicine. What should I watch for while using this medication? Your condition will be monitored carefully while you are receiving this medication. You may need blood work while taking this medication. This medication may make you feel generally unwell. This is not uncommon as chemotherapy can affect healthy cells as well as cancer cells. Report any side effects. Continue your course of treatment even though  you feel ill unless your care team tells you to stop. This medication may increase your risk of getting an infection. Call your care team for advice if you get a fever, chills, sore throat, or other symptoms of a cold or flu. Do not treat yourself. Try to avoid being around people who are sick. Avoid taking medications that contain aspirin, acetaminophen, ibuprofen, naproxen, or ketoprofen unless instructed by your care team. These medications may hide a fever. Be careful brushing or flossing your teeth or using a toothpick because you may get an infection or bleed more easily. If you have any dental work done, tell your dentist  you are receiving this medication. This medication can make you more sensitive to cold. Do not drink cold drinks or use ice. Cover exposed skin before coming in contact with cold temperatures or cold objects. When out in cold weather wear warm clothing and cover your mouth and nose to warm the air that goes into your lungs. Tell your care team if you get sensitive to the cold. Talk to your care team if you or your partner are pregnant or think either of you might be pregnant. This medication can cause serious birth defects if taken during pregnancy and for 9 months after the last dose. A negative pregnancy test is required before starting this medication. A reliable form of contraception is recommended while taking this medication and for 9 months after the last dose. Talk to your care team about effective forms of contraception. Do not father a child while taking this medication and for 6 months after the last dose. Use a condom while having sex during this time period. Do not breastfeed while taking this medication and for 3 months after the last dose. This medication may cause infertility. Talk to your care team if you are concerned about your fertility. What side effects may I notice from receiving this medication? Side effects that you should report to your care team as soon as possible: Allergic reactions--skin rash, itching, hives, swelling of the face, lips, tongue, or throat Bleeding--bloody or black, tar-like stools, vomiting blood or brown material that looks like coffee grounds, red or dark brown urine, small red or purple spots on skin, unusual bruising or bleeding Dry cough, shortness of breath or trouble breathing Heart rhythm changes--fast or irregular heartbeat, dizziness, feeling faint or lightheaded, chest pain, trouble breathing Infection--fever, chills, cough, sore throat, wounds that don't heal, pain or trouble when passing urine, general feeling of discomfort or being unwell Liver  injury--right upper belly pain, loss of appetite, nausea, light-colored stool, dark yellow or brown urine, yellowing skin or eyes, unusual weakness or fatigue Low red blood cell level--unusual weakness or fatigue, dizziness, headache, trouble breathing Muscle injury--unusual weakness or fatigue, muscle pain, dark yellow or brown urine, decrease in amount of urine Pain, tingling, or numbness in the hands or feet Sudden and severe headache, confusion, change in vision, seizures, which may be signs of posterior reversible encephalopathy syndrome (PRES) Unusual bruising or bleeding Side effects that usually do not require medical attention (report to your care team if they continue or are bothersome): Diarrhea Nausea Pain, redness, or swelling with sores inside the mouth or throat Unusual weakness or fatigue Vomiting This list may not describe all possible side effects. Call your doctor for medical advice about side effects. You may report side effects to FDA at 1-800-FDA-1088. Where should I keep my medication? This medication is given in a hospital or clinic. It will not be  stored at home. NOTE: This sheet is a summary. It may not cover all possible information. If you have questions about this medicine, talk to your doctor, pharmacist, or health care provider.  2024 Elsevier/Gold Standard (2022-04-24 00:00:00)   Leucovorin Injection What is this medication? LEUCOVORIN (loo koe VOR in) prevents side effects from certain medications, such as methotrexate. It works by increasing folate levels. This helps protect healthy cells in your body. It may also be used to treat anemia caused by low levels of folate. It can also be used with fluorouracil, a type of chemotherapy, to treat colorectal cancer. It works by increasing the effects of fluorouracil in the body. This medicine may be used for other purposes; ask your health care provider or pharmacist if you have questions. What should I tell my care  team before I take this medication? They need to know if you have any of these conditions: Anemia from low levels of vitamin B12 in the blood An unusual or allergic reaction to leucovorin, folic acid, other medications, foods, dyes, or preservatives Pregnant or trying to get pregnant Breastfeeding How should I use this medication? This medication is injected into a vein or a muscle. It is given by your care team in a hospital or clinic setting. Talk to your care team about the use of this medication in children. Special care may be needed. Overdosage: If you think you have taken too much of this medicine contact a poison control center or emergency room at once. NOTE: This medicine is only for you. Do not share this medicine with others. What if I miss a dose? Keep appointments for follow-up doses. It is important not to miss your dose. Call your care team if you are unable to keep an appointment. What may interact with this medication? Capecitabine Fluorouracil Phenobarbital Phenytoin Primidone Trimethoprim;sulfamethoxazole This list may not describe all possible interactions. Give your health care provider a list of all the medicines, herbs, non-prescription drugs, or dietary supplements you use. Also tell them if you smoke, drink alcohol, or use illegal drugs. Some items may interact with your medicine. What should I watch for while using this medication? Your condition will be monitored carefully while you are receiving this medication. This medication may increase the side effects of 5-fluorouracil. Tell your care team if you have diarrhea or mouth sores that do not get better or that get worse. What side effects may I notice from receiving this medication? Side effects that you should report to your care team as soon as possible: Allergic reactions--skin rash, itching, hives, swelling of the face, lips, tongue, or throat This list may not describe all possible side effects. Call your  doctor for medical advice about side effects. You may report side effects to FDA at 1-800-FDA-1088. Where should I keep my medication? This medication is given in a hospital or clinic. It will not be stored at home. NOTE: This sheet is a summary. It may not cover all possible information. If you have questions about this medicine, talk to your doctor, pharmacist, or health care provider.  2024 Elsevier/Gold Standard (2021-12-12 00:00:00)   Fluorouracil Injection What is this medication? FLUOROURACIL (flure oh YOOR a sil) treats some types of cancer. It works by slowing down the growth of cancer cells. This medicine may be used for other purposes; ask your health care provider or pharmacist if you have questions. COMMON BRAND NAME(S): Adrucil What should I tell my care team before I take this medication? They need to  know if you have any of these conditions: Blood disorders Dihydropyrimidine dehydrogenase (DPD) deficiency Infection, such as chickenpox, cold sores, herpes Kidney disease Liver disease Poor nutrition Recent or ongoing radiation therapy An unusual or allergic reaction to fluorouracil, other medications, foods, dyes, or preservatives If you or your partner are pregnant or trying to get pregnant Breast-feeding How should I use this medication? This medication is injected into a vein. It is administered by your care team in a hospital or clinic setting. Talk to your care team about the use of this medication in children. Special care may be needed. Overdosage: If you think you have taken too much of this medicine contact a poison control center or emergency room at once. NOTE: This medicine is only for you. Do not share this medicine with others. What if I miss a dose? Keep appointments for follow-up doses. It is important not to miss your dose. Call your care team if you are unable to keep an appointment. What may interact with this medication? Do not take this medication  with any of the following: Live virus vaccines This medication may also interact with the following: Medications that treat or prevent blood clots, such as warfarin, enoxaparin, dalteparin This list may not describe all possible interactions. Give your health care provider a list of all the medicines, herbs, non-prescription drugs, or dietary supplements you use. Also tell them if you smoke, drink alcohol, or use illegal drugs. Some items may interact with your medicine. What should I watch for while using this medication? Your condition will be monitored carefully while you are receiving this medication. This medication may make you feel generally unwell. This is not uncommon as chemotherapy can affect healthy cells as well as cancer cells. Report any side effects. Continue your course of treatment even though you feel ill unless your care team tells you to stop. In some cases, you may be given additional medications to help with side effects. Follow all directions for their use. This medication may increase your risk of getting an infection. Call your care team for advice if you get a fever, chills, sore throat, or other symptoms of a cold or flu. Do not treat yourself. Try to avoid being around people who are sick. This medication may increase your risk to bruise or bleed. Call your care team if you notice any unusual bleeding. Be careful brushing or flossing your teeth or using a toothpick because you may get an infection or bleed more easily. If you have any dental work done, tell your dentist you are receiving this medication. Avoid taking medications that contain aspirin, acetaminophen, ibuprofen, naproxen, or ketoprofen unless instructed by your care team. These medications may hide a fever. Do not treat diarrhea with over the counter products. Contact your care team if you have diarrhea that lasts more than 2 days or if it is severe and watery. This medication can make you more sensitive to  the sun. Keep out of the sun. If you cannot avoid being in the sun, wear protective clothing and sunscreen. Do not use sun lamps, tanning beds, or tanning booths. Talk to your care team if you or your partner wish to become pregnant or think you might be pregnant. This medication can cause serious birth defects if taken during pregnancy and for 3 months after the last dose. A reliable form of contraception is recommended while taking this medication and for 3 months after the last dose. Talk to your care team about effective forms  of contraception. Do not father a child while taking this medication and for 3 months after the last dose. Use a condom while having sex during this time period. Do not breastfeed while taking this medication. This medication may cause infertility. Talk to your care team if you are concerned about your fertility. What side effects may I notice from receiving this medication? Side effects that you should report to your care team as soon as possible: Allergic reactions--skin rash, itching, hives, swelling of the face, lips, tongue, or throat Heart attack--pain or tightness in the chest, shoulders, arms, or jaw, nausea, shortness of breath, cold or clammy skin, feeling faint or lightheaded Heart failure--shortness of breath, swelling of the ankles, feet, or hands, sudden weight gain, unusual weakness or fatigue Heart rhythm changes--fast or irregular heartbeat, dizziness, feeling faint or lightheaded, chest pain, trouble breathing High ammonia level--unusual weakness or fatigue, confusion, loss of appetite, nausea, vomiting, seizures Infection--fever, chills, cough, sore throat, wounds that don't heal, pain or trouble when passing urine, general feeling of discomfort or being unwell Low red blood cell level--unusual weakness or fatigue, dizziness, headache, trouble breathing Pain, tingling, or numbness in the hands or feet, muscle weakness, change in vision, confusion or trouble  speaking, loss of balance or coordination, trouble walking, seizures Redness, swelling, and blistering of the skin over hands and feet Severe or prolonged diarrhea Unusual bruising or bleeding Side effects that usually do not require medical attention (report to your care team if they continue or are bothersome): Dry skin Headache Increased tears Nausea Pain, redness, or swelling with sores inside the mouth or throat Sensitivity to light Vomiting This list may not describe all possible side effects. Call your doctor for medical advice about side effects. You may report side effects to FDA at 1-800-FDA-1088. Where should I keep my medication? This medication is given in a hospital or clinic. It will not be stored at home. NOTE: This sheet is a summary. It may not cover all possible information. If you have questions about this medicine, talk to your doctor, pharmacist, or health care provider.  2024 [employer] Standard (2021-11-14 00:00:00)   The chemotherapy medication bag should finish at 46 hours, 96 hours, or 7 days. For example, if your pump is scheduled for 46 hours and it was put on at 4:00 p.m., it should finish at 2:00 p.m. the day it is scheduled to come off regardless of your appointment time.     Estimated time to finish at 1:00 p.m on Friday 07/21/2023.   If the display on your pump reads "Low Volume" and it is beeping, take the batteries out of the pump and come to the cancer center for it to be taken off.   If the pump alarms go off prior to the pump reading "Low Volume" then call [phone] and someone can assist you.  If the plunger comes out and the chemotherapy medication is leaking out, please use your home chemo spill kit to clean up the spill. Do NOT use paper towels or other household products.  If you have problems or questions regarding your pump, please call either [phone] (24 hours a day) or the cancer center Monday-Friday 8:00 a.m.- 4:30 p.m. at  the clinic number and we will assist you. If you are unable to get assistance, then go to the nearest Emergency Department and ask the staff to contact the IV team for assistance.    Magnesium Sulfate Injection What is this medication? MAGNESIUM SULFATE (mag NEE zee um  SUL fate) prevents and treats low levels of magnesium in your body. It may also be used to prevent and treat seizures during pregnancy in people with high blood pressure disorders, such as preeclampsia or eclampsia. Magnesium plays an important role in maintaining the health of your muscles and nervous system. This medicine may be used for other purposes; ask your health care provider or pharmacist if you have questions. What should I tell my care team before I take this medication? They need to know if you have any of these conditions: Heart disease History of irregular heart beat Kidney disease An unusual or allergic reaction to magnesium sulfate, medications, foods, dyes, or preservatives Pregnant or trying to get pregnant Breast-feeding How should I use this medication? This medication is for infusion into a vein. It is given in a hospital or clinic setting. Talk to your care team about the use of this medication in children. While this medication may be prescribed for selected conditions, precautions do apply. Overdosage: If you think you have taken too much of this medicine contact a poison control center or emergency room at once. NOTE: This medicine is only for you. Do not share this medicine with others. What if I miss a dose? This does not apply. What may interact with this medication? Certain medications for anxiety or sleep Certain medications for seizures, such phenobarbital Digoxin Medications that relax muscles for surgery Narcotic medications for pain This list may not describe all possible interactions. Give your health care provider a list of all the medicines, herbs, non-prescription drugs, or dietary  supplements you use. Also tell them if you smoke, drink alcohol, or use illegal drugs. Some items may interact with your medicine. What should I watch for while using this medication? Your condition will be monitored carefully while you are receiving this medication. You may need blood work done while you are receiving this medication. What side effects may I notice from receiving this medication? Side effects that you should report to your care team as soon as possible: Allergic reactions--skin rash, itching, hives, swelling of the face, lips, tongue, or throat High magnesium level--confusion, drowsiness, facial flushing, redness, sweating, muscle weakness, fast or irregular heartbeat, trouble breathing Low blood pressure--dizziness, feeling faint or lightheaded, blurry vision Side effects that usually do not require medical attention (report to your care team if they continue or are bothersome): Headache Nausea This list may not describe all possible side effects. Call your doctor for medical advice about side effects. You may report side effects to FDA at 1-800-FDA-1088. Where should I keep my medication? This medication is given in a hospital or clinic and will not be stored at home. NOTE: This sheet is a summary. It may not cover all possible information. If you have questions about this medicine, talk to your doctor, pharmacist, or health care provider.  2024 Elsevier/Gold Standard (2021-03-22 00:00:00)

## 2023-06-20 ENCOUNTER — Other Ambulatory Visit: Payer: Self-pay

## 2023-06-21 ENCOUNTER — Inpatient Hospital Stay: Payer: Medicare Other

## 2023-06-21 VITALS — BP 113/74 | HR 107 | Resp 16

## 2023-06-21 DIAGNOSIS — Z5112 Encounter for antineoplastic immunotherapy: Secondary | ICD-10-CM | POA: Diagnosis not present

## 2023-06-21 DIAGNOSIS — C187 Malignant neoplasm of sigmoid colon: Secondary | ICD-10-CM

## 2023-06-21 MED ORDER — HEPARIN SOD (PORK) LOCK FLUSH 100 UNIT/ML IV SOLN
500.0000 [IU] | Freq: Once | INTRAVENOUS | Status: AC | PRN
  Administered 2023-06-21: 500 [IU]

## 2023-06-21 MED ORDER — SODIUM CHLORIDE 0.9% FLUSH
10.0000 mL | INTRAVENOUS | Status: DC | PRN
  Administered 2023-06-21: 10 mL

## 2023-06-29 ENCOUNTER — Other Ambulatory Visit: Payer: Self-pay | Admitting: Nurse Practitioner

## 2023-06-29 DIAGNOSIS — C187 Malignant neoplasm of sigmoid colon: Secondary | ICD-10-CM

## 2023-06-30 ENCOUNTER — Other Ambulatory Visit: Payer: Self-pay | Admitting: Oncology

## 2023-07-01 ENCOUNTER — Encounter: Payer: Self-pay | Admitting: Oncology

## 2023-07-03 ENCOUNTER — Inpatient Hospital Stay: Payer: Medicare Other

## 2023-07-03 ENCOUNTER — Other Ambulatory Visit: Payer: Self-pay

## 2023-07-03 ENCOUNTER — Encounter: Payer: Self-pay | Admitting: Nurse Practitioner

## 2023-07-03 ENCOUNTER — Inpatient Hospital Stay: Payer: Medicare Other | Attending: Oncology

## 2023-07-03 ENCOUNTER — Inpatient Hospital Stay (HOSPITAL_BASED_OUTPATIENT_CLINIC_OR_DEPARTMENT_OTHER): Payer: Medicare Other | Admitting: Nurse Practitioner

## 2023-07-03 VITALS — BP 94/77 | HR 100 | Temp 98.2°F | Resp 18 | Ht 66.0 in | Wt 208.4 lb

## 2023-07-03 VITALS — BP 104/76 | HR 103

## 2023-07-03 DIAGNOSIS — C187 Malignant neoplasm of sigmoid colon: Secondary | ICD-10-CM | POA: Insufficient documentation

## 2023-07-03 DIAGNOSIS — Z5111 Encounter for antineoplastic chemotherapy: Secondary | ICD-10-CM | POA: Insufficient documentation

## 2023-07-03 DIAGNOSIS — Z5112 Encounter for antineoplastic immunotherapy: Secondary | ICD-10-CM | POA: Diagnosis present

## 2023-07-03 DIAGNOSIS — D696 Thrombocytopenia, unspecified: Secondary | ICD-10-CM | POA: Diagnosis not present

## 2023-07-03 DIAGNOSIS — C787 Secondary malignant neoplasm of liver and intrahepatic bile duct: Secondary | ICD-10-CM | POA: Insufficient documentation

## 2023-07-03 LAB — CBC WITH DIFFERENTIAL (CANCER CENTER ONLY)
Abs Immature Granulocytes: 0.01 10*3/uL (ref 0.00–0.07)
Basophils Absolute: 0.1 10*3/uL (ref 0.0–0.1)
Basophils Relative: 1 %
Eosinophils Absolute: 0.2 10*3/uL (ref 0.0–0.5)
Eosinophils Relative: 3 %
HCT: 44.4 % (ref 39.0–52.0)
Hemoglobin: 14.7 g/dL (ref 13.0–17.0)
Immature Granulocytes: 0 %
Lymphocytes Relative: 33 %
Lymphs Abs: 2.2 10*3/uL (ref 0.7–4.0)
MCH: 31.3 pg (ref 26.0–34.0)
MCHC: 33.1 g/dL (ref 30.0–36.0)
MCV: 94.7 fL (ref 80.0–100.0)
Monocytes Absolute: 0.9 10*3/uL (ref 0.1–1.0)
Monocytes Relative: 14 %
Neutro Abs: 3.3 10*3/uL (ref 1.7–7.7)
Neutrophils Relative %: 49 %
Platelet Count: 74 10*3/uL — ABNORMAL LOW (ref 150–400)
RBC: 4.69 MIL/uL (ref 4.22–5.81)
RDW: 17.4 % — ABNORMAL HIGH (ref 11.5–15.5)
WBC Count: 6.7 10*3/uL (ref 4.0–10.5)
nRBC: 0 % (ref 0.0–0.2)

## 2023-07-03 LAB — CMP (CANCER CENTER ONLY)
ALT: 30 U/L (ref 0–44)
AST: 35 U/L (ref 15–41)
Albumin: 3.6 g/dL (ref 3.5–5.0)
Alkaline Phosphatase: 115 U/L (ref 38–126)
Anion gap: 6 (ref 5–15)
BUN: 10 mg/dL (ref 6–20)
CO2: 29 mmol/L (ref 22–32)
Calcium: 8.9 mg/dL (ref 8.9–10.3)
Chloride: 105 mmol/L (ref 98–111)
Creatinine: 0.63 mg/dL (ref 0.61–1.24)
GFR, Estimated: >60 mL/min (ref >60)
Glucose, Bld: 112 mg/dL — ABNORMAL HIGH (ref 70–99)
Potassium: 3.8 mmol/L (ref 3.5–5.1)
Sodium: 140 mmol/L (ref 135–145)
Total Bilirubin: 0.6 mg/dL (ref <1.2)
Total Protein: 6.8 g/dL (ref 6.5–8.1)

## 2023-07-03 LAB — CEA (ACCESS): CEA (CHCC): 3.8 ng/mL (ref 0.00–5.00)

## 2023-07-03 LAB — MAGNESIUM: Magnesium: 1.6 mg/dL — ABNORMAL LOW (ref 1.7–2.4)

## 2023-07-03 MED ORDER — PALONOSETRON HCL INJECTION 0.25 MG/5ML: 0.2500 mg | Freq: Once | INTRAVENOUS | Status: DC

## 2023-07-03 MED ORDER — MAGNESIUM SULFATE 2 GM/50ML IV SOLN
2.0000 g | Freq: Once | INTRAVENOUS | Status: DC
  Filled 2023-07-03: qty 50

## 2023-07-03 MED ORDER — SODIUM CHLORIDE 0.9 % IV SOLN
2400.0000 mg/m2 | INTRAVENOUS | Status: DC
  Administered 2023-07-03: 5000 mg via INTRAVENOUS
  Filled 2023-07-03: qty 100

## 2023-07-03 MED ORDER — MAGNESIUM SULFATE 2 GM/50ML IV SOLN: 2.0000 g | Freq: Once | INTRAVENOUS | Status: DC

## 2023-07-03 MED ORDER — DEXTROSE 5 % IV SOLN
400.0000 mg/m2 | Freq: Once | INTRAVENOUS | Status: AC
  Administered 2023-07-03: 836 mg via INTRAVENOUS
  Filled 2023-07-03: qty 41.8

## 2023-07-03 MED ORDER — DEXAMETHASONE SODIUM PHOSPHATE 10 MG/ML IJ SOLN: 10.0000 mg | Freq: Once | INTRAMUSCULAR | Status: DC

## 2023-07-03 MED ORDER — SODIUM CHLORIDE 0.9 % IV SOLN
Freq: Once | INTRAVENOUS | Status: AC
Start: 2023-07-03 — End: 2023-07-03

## 2023-07-03 MED ORDER — MAGNESIUM SULFATE 2 GM/50ML IV SOLN
2.0000 g | Freq: Once | INTRAVENOUS | Status: AC
Start: 2023-07-03 — End: 2023-07-03
  Administered 2023-07-03: 2 g via INTRAVENOUS

## 2023-07-03 MED ORDER — SODIUM CHLORIDE 0.9 % IV SOLN
6.0000 mg/kg | Freq: Once | INTRAVENOUS | Status: AC
  Administered 2023-07-03: 600 mg via INTRAVENOUS
  Filled 2023-07-03: qty 20

## 2023-07-03 MED ORDER — FLUOROURACIL CHEMO INJECTION 2.5 GM/50ML
400.0000 mg/m2 | Freq: Once | INTRAVENOUS | Status: AC
  Administered 2023-07-03: 850 mg via INTRAVENOUS
  Filled 2023-07-03: qty 17

## 2023-07-03 NOTE — Patient Instructions (Addendum)
[facility] - A DEPT OF [facility]   The chemotherapy medication bag should finish at 46 hours, 96 hours, or 7 days. For example, if your pump is scheduled for 46 hours and it was put on at 4:00 p.m., it should finish at 2:00 p.m. the day it is scheduled to come off regardless of your appointment time.     Estimated time to finish at 11:15 Friday, July 05, 2023.   If the display on your pump reads "Low Volume" and it is beeping, take the batteries out of the pump and come to the cancer center for it to be taken off.   If the pump alarms go off prior to the pump reading "Low Volume" then call [phone] and someone can assist you.  If the plunger comes out and the chemotherapy medication is leaking out, please use your home chemo spill kit to clean up the spill. Do NOT use paper towels or other household products.  If you have problems or questions regarding your pump, please call either [phone] (24 hours a day) or the cancer center Monday-Friday 8:00 a.m.- 4:30 p.m. at the clinic number and we will assist you. If you are unable to get assistance, then go to the nearest Emergency Department and ask the staff to contact the IV team for assistance.  Discharge Instructions: Thank you for choosing [facility] to provide your oncology and hematology care.   If you have a lab appointment with the Cancer Center, please go directly to the Cancer Center and check in at the registration area.   Wear comfortable clothing and clothing appropriate for easy access to any Portacath or PICC line.   We strive to give you quality time with your provider. You may need to reschedule your appointment if you arrive late (15 or more minutes).  Arriving late affects you and other patients whose appointments are after yours.  Also, if you miss three or more appointments without notifying the office, you may be dismissed from the clinic at the provider's  discretion.      For prescription refill requests, have your pharmacy contact our office and allow 72 hours for refills to be completed.    Today you received the following chemotherapy and/or immunotherapy agents VECTIBIX/LEUCOVORIN/FLUOROURACIL      To help prevent nausea and vomiting after your treatment, we encourage you to take your nausea medication as directed.  BELOW ARE SYMPTOMS THAT SHOULD BE REPORTED IMMEDIATELY: *FEVER GREATER THAN 100.4 F (38 C) OR HIGHER *CHILLS OR SWEATING *NAUSEA AND VOMITING THAT IS NOT CONTROLLED WITH YOUR NAUSEA MEDICATION *UNUSUAL SHORTNESS OF BREATH *UNUSUAL BRUISING OR BLEEDING *URINARY PROBLEMS (pain or burning when urinating, or frequent urination) *BOWEL PROBLEMS (unusual diarrhea, constipation, pain near the anus) TENDERNESS IN MOUTH AND THROAT WITH OR WITHOUT PRESENCE OF ULCERS (sore throat, sores in mouth, or a toothache) UNUSUAL RASH, SWELLING OR PAIN  UNUSUAL VAGINAL DISCHARGE OR ITCHING   Items with * indicate a potential emergency and should be followed up as soon as possible or go to the Emergency Department if any problems should occur.  Please show the CHEMOTHERAPY ALERT CARD or IMMUNOTHERAPY ALERT CARD at check-in to the Emergency Department and triage nurse.  Should you have questions after your visit or need to cancel or reschedule your appointment, please contact [facility] - A DEPT OF [facility]  Dept: [phone]  and follow the prompts.  Office hours are  8:00 a.m. to 4:30 p.m. Monday - Friday. Please note that voicemails left after 4:00 p.m. may not be returned until the following business day.  We are closed weekends and major holidays. You have access to a nurse at all times for urgent questions. Please call the main number to the clinic Dept: [phone] and follow the prompts.   For any non-urgent questions, you may also contact your provider using MyChart. We now offer e-Visits for anyone  11 and older to request care online for non-urgent symptoms. For details visit [email or web address].   Also download the MyChart app! Go to the app store, search "MyChart", open the app, select [facility], and log in with your MyChart username and password.  Panitumumab Injection What is this medication? PANITUMUMAB (pan i TOOM ue mab) treats colorectal cancer. It works by blocking a protein that causes cancer cells to grow and multiply. This helps to slow or stop the spread of cancer cells. It is a monoclonal antibody. This medicine may be used for other purposes; ask your health care provider or pharmacist if you have questions. COMMON BRAND NAME(S): Vectibix What should I tell my care team before I take this medication? They need to know if you have any of these conditions: Eye disease Low levels of magnesium in the blood Lung disease An unusual or allergic reaction to panitumumab, other medications, foods, dyes, or preservatives Pregnant or trying to get pregnant Breast-feeding How should I use this medication? This medication is injected into a vein. It is given by your care team in a hospital or clinic setting. Talk to your care team about the use of this medication in children. Special care may be needed. Overdosage: If you think you have taken too much of this medicine contact a poison control center or emergency room at once. NOTE: This medicine is only for you. Do not share this medicine with others. What if I miss a dose? Keep appointments for follow-up doses. It is important not to miss your dose. Call your care team if you are unable to keep an appointment. What may interact with this medication? Bevacizumab This list may not describe all possible interactions. Give your health care provider a list of all the medicines, herbs, non-prescription drugs, or dietary supplements you use. Also tell them if you smoke, drink alcohol, or use illegal drugs. Some items may interact with  your medicine. What should I watch for while using this medication? Your condition will be monitored carefully while you are receiving this medication. This medication may make you feel generally unwell. This is not uncommon as chemotherapy can affect healthy cells as well as cancer cells. Report any side effects. Continue your course of treatment even though you feel ill unless your care team tells you to stop. This medication can make you more sensitive to the sun. Keep out of the sun while receiving this medication and for 2 months after stopping therapy. If you cannot avoid being in the sun, wear protective clothing and sunscreen. Do not use sun lamps, tanning beds, or tanning booths. Check with your care team if you have severe diarrhea, nausea, and vomiting or if you sweat a lot. The loss of too much body fluid may make it dangerous for you to take this medication. This medication may cause serious skin reactions. They can happen weeks to months after starting the medication. Contact your care team right away if you notice fevers or flu-like symptoms with a rash. The rash may be red  or purple and then turn into blisters or peeling of the skin. You may also notice a red rash with swelling of the face, lips, or lymph nodes in your neck or under your arms. Talk to your care team if you may be pregnant. Serious birth defects can occur if you take this medication during pregnancy and for 2 months after the last dose. Contraception is recommended while taking this medication and for 2 months after the last dose. Your care team can help you find the option that works for you. Do not breastfeed while taking this medication and for 2 months after the last dose. This medication may cause infertility. Talk to your care team if you are concerned about your fertility. What side effects may I notice from receiving this medication? Side effects that you should report to your care team as soon as  possible: Allergic reactions--skin rash, itching, hives, swelling of the face, lips, tongue, or throat Dry cough, shortness of breath or trouble breathing Eye pain, redness, irritation, or discharge with blurry or decreased vision Infusion reactions--chest pain, shortness of breath or trouble breathing, feeling faint or lightheaded Low magnesium level--muscle pain or cramps, unusual weakness or fatigue, fast or irregular heartbeat, tremors Low potassium level--muscle pain or cramps, unusual weakness or fatigue, fast or irregular heartbeat, constipation Redness, blistering, peeling, or loosening of the skin, including inside the mouth Skin reactions on sun-exposed areas Side effects that usually do not require medical attention (report to your care team if they continue or are bothersome): Change in nail shape, thickness, or color Diarrhea Dry skin Fatigue Nausea Vomiting This list may not describe all possible side effects. Call your doctor for medical advice about side effects. You may report side effects to FDA at 1-800-FDA-1088. Where should I keep my medication? This medication is given in a hospital or clinic. It will not be stored at home. NOTE: This sheet is a summary. It may not cover all possible information. If you have questions about this medicine, talk to your doctor, pharmacist, or health care provider.  2024 Elsevier/Gold Standard (2021-11-22 00:00:00)  Leucovorin Injection What is this medication? LEUCOVORIN (loo koe VOR in) prevents side effects from certain medications, such as methotrexate. It works by increasing folate levels. This helps protect healthy cells in your body. It may also be used to treat anemia caused by low levels of folate. It can also be used with fluorouracil, a type of chemotherapy, to treat colorectal cancer. It works by increasing the effects of fluorouracil in the body. This medicine may be used for other purposes; ask your health care provider or  pharmacist if you have questions. What should I tell my care team before I take this medication? They need to know if you have any of these conditions: Anemia from low levels of vitamin B12 in the blood An unusual or allergic reaction to leucovorin, folic acid, other medications, foods, dyes, or preservatives Pregnant or trying to get pregnant Breastfeeding How should I use this medication? This medication is injected into a vein or a muscle. It is given by your care team in a hospital or clinic setting. Talk to your care team about the use of this medication in children. Special care may be needed. Overdosage: If you think you have taken too much of this medicine contact a poison control center or emergency room at once. NOTE: This medicine is only for you. Do not share this medicine with others. What if I miss a dose? Keep appointments  for follow-up doses. It is important not to miss your dose. Call your care team if you are unable to keep an appointment. What may interact with this medication? Capecitabine Fluorouracil Phenobarbital Phenytoin Primidone Trimethoprim;sulfamethoxazole This list may not describe all possible interactions. Give your health care provider a list of all the medicines, herbs, non-prescription drugs, or dietary supplements you use. Also tell them if you smoke, drink alcohol, or use illegal drugs. Some items may interact with your medicine. What should I watch for while using this medication? Your condition will be monitored carefully while you are receiving this medication. This medication may increase the side effects of 5-fluorouracil. Tell your care team if you have diarrhea or mouth sores that do not get better or that get worse. What side effects may I notice from receiving this medication? Side effects that you should report to your care team as soon as possible: Allergic reactions--skin rash, itching, hives, swelling of the face, lips, tongue, or  throat This list may not describe all possible side effects. Call your doctor for medical advice about side effects. You may report side effects to FDA at 1-800-FDA-1088. Where should I keep my medication? This medication is given in a hospital or clinic. It will not be stored at home. NOTE: This sheet is a summary. It may not cover all possible information. If you have questions about this medicine, talk to your doctor, pharmacist, or health care provider.  2024 Elsevier/Gold Standard (2021-12-12 00:00:00)  Fluorouracil Injection What is this medication? FLUOROURACIL (flure oh YOOR a sil) treats some types of cancer. It works by slowing down the growth of cancer cells. This medicine may be used for other purposes; ask your health care provider or pharmacist if you have questions. COMMON BRAND NAME(S): Adrucil What should I tell my care team before I take this medication? They need to know if you have any of these conditions: Blood disorders Dihydropyrimidine dehydrogenase (DPD) deficiency Infection, such as chickenpox, cold sores, herpes Kidney disease Liver disease Poor nutrition Recent or ongoing radiation therapy An unusual or allergic reaction to fluorouracil, other medications, foods, dyes, or preservatives If you or your partner are pregnant or trying to get pregnant Breast-feeding How should I use this medication? This medication is injected into a vein. It is administered by your care team in a hospital or clinic setting. Talk to your care team about the use of this medication in children. Special care may be needed. Overdosage: If you think you have taken too much of this medicine contact a poison control center or emergency room at once. NOTE: This medicine is only for you. Do not share this medicine with others. What if I miss a dose? Keep appointments for follow-up doses. It is important not to miss your dose. Call your care team if you are unable to keep an  appointment. What may interact with this medication? Do not take this medication with any of the following: Live virus vaccines This medication may also interact with the following: Medications that treat or prevent blood clots, such as warfarin, enoxaparin, dalteparin This list may not describe all possible interactions. Give your health care provider a list of all the medicines, herbs, non-prescription drugs, or dietary supplements you use. Also tell them if you smoke, drink alcohol, or use illegal drugs. Some items may interact with your medicine. What should I watch for while using this medication? Your condition will be monitored carefully while you are receiving this medication. This medication may make you  feel generally unwell. This is not uncommon as chemotherapy can affect healthy cells as well as cancer cells. Report any side effects. Continue your course of treatment even though you feel ill unless your care team tells you to stop. In some cases, you may be given additional medications to help with side effects. Follow all directions for their use. This medication may increase your risk of getting an infection. Call your care team for advice if you get a fever, chills, sore throat, or other symptoms of a cold or flu. Do not treat yourself. Try to avoid being around people who are sick. This medication may increase your risk to bruise or bleed. Call your care team if you notice any unusual bleeding. Be careful brushing or flossing your teeth or using a toothpick because you may get an infection or bleed more easily. If you have any dental work done, tell your dentist you are receiving this medication. Avoid taking medications that contain aspirin, acetaminophen, ibuprofen, naproxen, or ketoprofen unless instructed by your care team. These medications may hide a fever. Do not treat diarrhea with over the counter products. Contact your care team if you have diarrhea that lasts more than 2  days or if it is severe and watery. This medication can make you more sensitive to the sun. Keep out of the sun. If you cannot avoid being in the sun, wear protective clothing and sunscreen. Do not use sun lamps, tanning beds, or tanning booths. Talk to your care team if you or your partner wish to become pregnant or think you might be pregnant. This medication can cause serious birth defects if taken during pregnancy and for 3 months after the last dose. A reliable form of contraception is recommended while taking this medication and for 3 months after the last dose. Talk to your care team about effective forms of contraception. Do not father a child while taking this medication and for 3 months after the last dose. Use a condom while having sex during this time period. Do not breastfeed while taking this medication. This medication may cause infertility. Talk to your care team if you are concerned about your fertility. What side effects may I notice from receiving this medication? Side effects that you should report to your care team as soon as possible: Allergic reactions--skin rash, itching, hives, swelling of the face, lips, tongue, or throat Heart attack--pain or tightness in the chest, shoulders, arms, or jaw, nausea, shortness of breath, cold or clammy skin, feeling faint or lightheaded Heart failure--shortness of breath, swelling of the ankles, feet, or hands, sudden weight gain, unusual weakness or fatigue Heart rhythm changes--fast or irregular heartbeat, dizziness, feeling faint or lightheaded, chest pain, trouble breathing High ammonia level--unusual weakness or fatigue, confusion, loss of appetite, nausea, vomiting, seizures Infection--fever, chills, cough, sore throat, wounds that don't heal, pain or trouble when passing urine, general feeling of discomfort or being unwell Low red blood cell level--unusual weakness or fatigue, dizziness, headache, trouble breathing Pain, tingling, or  numbness in the hands or feet, muscle weakness, change in vision, confusion or trouble speaking, loss of balance or coordination, trouble walking, seizures Redness, swelling, and blistering of the skin over hands and feet Severe or prolonged diarrhea Unusual bruising or bleeding Side effects that usually do not require medical attention (report to your care team if they continue or are bothersome): Dry skin Headache Increased tears Nausea Pain, redness, or swelling with sores inside the mouth or throat Sensitivity to light Vomiting This  list may not describe all possible side effects. Call your doctor for medical advice about side effects. You may report side effects to FDA at 1-800-FDA-1088. Where should I keep my medication? This medication is given in a hospital or clinic. It will not be stored at home. NOTE: This sheet is a summary. It may not cover all possible information. If you have questions about this medicine, talk to your doctor, pharmacist, or health care provider.  2024 Elsevier/Gold Standard (2021-11-14 00:00:00)

## 2023-07-03 NOTE — Progress Notes (Signed)
[facility] OFFICE PROGRESS NOTE   Diagnosis: Colon cancer  INTERVAL HISTORY:   Daniel [name] returns as scheduled.  He completed cycle 11 FOLFOX plus Panitumumab 06/19/2023.  He had an episode of nausea 2 to 3 days after the chemotherapy.  No mouth sores.  No diarrhea.  Cold sensitivity lasted 4 to 5 days.  No persistent neuropathy symptoms.  He notes a stable rash on his trunk.  He has an alteration in taste.  Objective:  Vital signs in last 24 hours:  Blood pressure 94/77, pulse 100, temperature 98.2 F (36.8 C), temperature source Temporal, resp. rate 18, height 5\' 6"  (1.676 m), weight 208 lb 6.4 oz (94.5 kg), SpO2 100%.    HEENT: No thrush or ulcers. Resp: Lungs clear bilaterally. Cardio: Regular rate and rhythm. GI: No hepatosplenomegaly. Vascular: No leg edema. Neuro: Vibratory sense mildly decreased over the fingertips per tuning fork exam. Skin: Dry acne type rash mainly on the trunk. Port-A-Cath without erythema.  Lab Results:  Lab Results  Component Value Date   WBC 6.7 07/03/2023   HGB 14.7 07/03/2023   HCT 44.4 07/03/2023   MCV 94.7 07/03/2023   PLT 74 (L) 07/03/2023   NEUTROABS 3.3 07/03/2023    Imaging:  No results found.  Medications: I have reviewed the patient's current medications.  Assessment/Plan: Sigmoid colon mass, multiple liver lesions Ultrasound abdomen 01/08/2023-multifocal hyperechoic liver lesions, largest measuring 8.7 cm in the right hepatic lobe CT abdomen/pelvis 01/08/2023-multiple by lobar hypoattenuating hepatic lesions, masslike thickening of the sigmoid colon with abnormal spiculated pericolonic soft tissue.  IMA lymphadenopathy.  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 Colonoscopy 01/29/2023-malignant tumor in the sigmoid colon (invasive moderately differentiated adenocarcinoma), four 6-9 mm polyps in the rectum, ascending colon and cecum, one 14 mm polyp in the sigmoid colon (within the rectal polyp biopsies there are 2 similar-appearing fragments showing invasive tumor with associated adenomatous change which is felt to be carryover from the sigmoid mass biopsies, the rectal polyp biopsies also show multiple fragments of tubular adenoma without high-grade dysplasia) Cycle 1 FOLFOX 01/30/2023 Cycle 2 FOLFOX 02/12/2023 Cycle 3 FOLFOX plus Panitumumab 02/27/2023 Cycle 4 FOLFOX plus Panitumumab 03/13/2023 Cycle 5 FOLFOX plus panitumumab 03/27/2023 Cycle 6 FOLFOX plus Panitumumab 04/10/2023 CTs 04/19/2023-decrease size of hepatic metastases, no new lesions, decreased right upper quadrant and retroperitoneal lymph nodes, sigmoid colonic wall thickening no longer seen with a decrease in sigmoid mesenteric lymph nodes Cycle 7 FOLFOX plus panitumumab 04/24/2023, 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 Cycle 8 FOLFOX plus Panitumumab 05/08/2023 Cycle 9 FOLFOX plus Panitumumab 05/22/2023 Cycle 10 FOLFOX plus Panitumumab 06/05/2023 Cycle 11 FOLFOX plus panitumumab 06/19/2023 Cycle 12 FOLFOX plus Panitumumab 07/03/2023, oxaliplatin held due to thrombocytopenia Diabetes Cardiomyopathy Hypertension Hypercholesterolemia Weight loss secondary to [facility], metastatic carcinoma? Microcytic anemia secondary to #1  Disposition: Daniel [name] appears stable.  He has completed 11 cycles of FOLFOX plus Panitumumab.  He is tolerating treatment well.  There is no clinical evidence of disease progression.  He has progressive thrombocytopenia.  Oxaliplatin will be held with today's treatment.  He understands to contact the office with bleeding.  Plan to proceed with cycle 12 today as scheduled.  Restaging CTs prior to next office visit.  CBC and chemistry  panel reviewed.  Labs adequate to proceed as above.  Oxaliplatin will be held due  to thrombocytopenia.  Persistent mild decrease in magnesium.  He will receive IV magnesium today.  He will return for follow-up and treatment on 07/23/2023.  We are available to see him sooner if needed.    Lonna Cobb ANP/GNP-BC   07/03/2023  9:04 AM

## 2023-07-03 NOTE — Progress Notes (Addendum)
Patient seen by Lonna Cobb NP today  Vitals are within treatment parameters:Yes   Labs are within treatment parameters: No (Please specify and give further instructions.) mag 1.6 platelets 74,000 its ok to treat   Treatment plan has been signed: Yes   Per physician team, Patient is ready for treatment. Please note the following modifications:holding the oxali. And the pre-medication. the patient received 2g of IV  mag.

## 2023-07-04 ENCOUNTER — Other Ambulatory Visit: Payer: Self-pay

## 2023-07-05 ENCOUNTER — Inpatient Hospital Stay: Payer: Medicare Other

## 2023-07-05 VITALS — BP 77/65 | HR 98 | Temp 97.7°F | Resp 18

## 2023-07-05 DIAGNOSIS — C187 Malignant neoplasm of sigmoid colon: Secondary | ICD-10-CM

## 2023-07-05 DIAGNOSIS — Z5112 Encounter for antineoplastic immunotherapy: Secondary | ICD-10-CM | POA: Diagnosis not present

## 2023-07-05 MED ORDER — SODIUM CHLORIDE 0.9% FLUSH
10.0000 mL | INTRAVENOUS | Status: DC | PRN
  Administered 2023-07-05: 10 mL

## 2023-07-05 MED ORDER — HEPARIN SOD (PORK) LOCK FLUSH 100 UNIT/ML IV SOLN
500.0000 [IU] | Freq: Once | INTRAVENOUS | Status: AC | PRN
  Administered 2023-07-05: 500 [IU]

## 2023-07-05 NOTE — Patient Instructions (Signed)

## 2023-07-06 ENCOUNTER — Ambulatory Visit (HOSPITAL_BASED_OUTPATIENT_CLINIC_OR_DEPARTMENT_OTHER): Payer: Medicare Other

## 2023-07-06 ENCOUNTER — Other Ambulatory Visit: Payer: Self-pay | Admitting: Oncology

## 2023-07-06 DIAGNOSIS — C187 Malignant neoplasm of sigmoid colon: Secondary | ICD-10-CM

## 2023-07-08 ENCOUNTER — Encounter: Payer: Self-pay | Admitting: Oncology

## 2023-07-11 ENCOUNTER — Inpatient Hospital Stay: Payer: Medicare Other

## 2023-07-11 ENCOUNTER — Ambulatory Visit (HOSPITAL_BASED_OUTPATIENT_CLINIC_OR_DEPARTMENT_OTHER)
Admission: RE | Admit: 2023-07-11 | Discharge: 2023-07-11 | Disposition: A | Discharge status: Home / Self Care | Payer: Medicare Other | Source: Ambulatory Visit | Attending: Nurse Practitioner | Admitting: Nurse Practitioner

## 2023-07-11 DIAGNOSIS — C187 Malignant neoplasm of sigmoid colon: Secondary | ICD-10-CM | POA: Insufficient documentation

## 2023-07-11 DIAGNOSIS — Z95828 Presence of other vascular implants and grafts: Secondary | ICD-10-CM

## 2023-07-11 DIAGNOSIS — Z5112 Encounter for antineoplastic immunotherapy: Secondary | ICD-10-CM | POA: Diagnosis not present

## 2023-07-11 MED ORDER — HEPARIN SOD (PORK) LOCK FLUSH 100 UNIT/ML IV SOLN
500.0000 [IU] | Freq: Once | INTRAVENOUS | Status: AC
  Administered 2023-07-11: 500 [IU] via INTRAVENOUS

## 2023-07-11 MED ORDER — SODIUM CHLORIDE 0.9% FLUSH
10.0000 mL | INTRAVENOUS | Status: DC | PRN
  Administered 2023-07-11: 10 mL via INTRAVENOUS

## 2023-07-11 MED ORDER — IOHEXOL 300 MG/ML  SOLN
100.0000 mL | Freq: Once | INTRAMUSCULAR | Status: AC | PRN
  Administered 2023-07-11: 100 mL via INTRAVENOUS

## 2023-07-11 NOTE — Patient Instructions (Signed)

## 2023-07-21 ENCOUNTER — Other Ambulatory Visit: Payer: Self-pay | Admitting: Oncology

## 2023-07-23 ENCOUNTER — Inpatient Hospital Stay: Payer: Medicare Other

## 2023-07-23 ENCOUNTER — Other Ambulatory Visit: Payer: Self-pay | Admitting: *Deleted

## 2023-07-23 ENCOUNTER — Inpatient Hospital Stay (HOSPITAL_BASED_OUTPATIENT_CLINIC_OR_DEPARTMENT_OTHER): Payer: Medicare Other | Admitting: Oncology

## 2023-07-23 VITALS — BP 108/81 | HR 100 | Temp 98.2°F | Resp 18 | Ht 66.0 in | Wt 206.5 lb

## 2023-07-23 DIAGNOSIS — C187 Malignant neoplasm of sigmoid colon: Secondary | ICD-10-CM

## 2023-07-23 DIAGNOSIS — Z5112 Encounter for antineoplastic immunotherapy: Secondary | ICD-10-CM | POA: Diagnosis not present

## 2023-07-23 LAB — CBC WITH DIFFERENTIAL (CANCER CENTER ONLY)
Abs Immature Granulocytes: 0.02 10*3/uL (ref 0.00–0.07)
Basophils Absolute: 0.1 10*3/uL (ref 0.0–0.1)
Basophils Relative: 1 %
Eosinophils Absolute: 0.3 10*3/uL (ref 0.0–0.5)
Eosinophils Relative: 4 %
HCT: 43.7 % (ref 39.0–52.0)
Hemoglobin: 14.8 g/dL (ref 13.0–17.0)
Immature Granulocytes: 0 %
Lymphocytes Relative: 32 %
Lymphs Abs: 2.1 10*3/uL (ref 0.7–4.0)
MCH: 32.5 pg (ref 26.0–34.0)
MCHC: 33.9 g/dL (ref 30.0–36.0)
MCV: 96 fL (ref 80.0–100.0)
Monocytes Absolute: 0.7 10*3/uL (ref 0.1–1.0)
Monocytes Relative: 10 %
Neutro Abs: 3.4 10*3/uL (ref 1.7–7.7)
Neutrophils Relative %: 53 %
Platelet Count: 123 10*3/uL — ABNORMAL LOW (ref 150–400)
RBC: 4.55 MIL/uL (ref 4.22–5.81)
RDW: 16.7 % — ABNORMAL HIGH (ref 11.5–15.5)
WBC Count: 6.5 10*3/uL (ref 4.0–10.5)
nRBC: 0 % (ref 0.0–0.2)

## 2023-07-23 LAB — CMP (CANCER CENTER ONLY)
ALT: 24 U/L (ref 0–44)
AST: 34 U/L (ref 15–41)
Albumin: 4 g/dL (ref 3.5–5.0)
Alkaline Phosphatase: 117 U/L (ref 38–126)
Anion gap: 10 (ref 5–15)
BUN: 13 mg/dL (ref 6–20)
CO2: 26 mmol/L (ref 22–32)
Calcium: 8.8 mg/dL — ABNORMAL LOW (ref 8.9–10.3)
Chloride: 104 mmol/L (ref 98–111)
Creatinine: 0.69 mg/dL (ref 0.61–1.24)
GFR, Estimated: >60 mL/min (ref >60)
Glucose, Bld: 161 mg/dL — ABNORMAL HIGH (ref 70–99)
Potassium: 3.8 mmol/L (ref 3.5–5.1)
Sodium: 140 mmol/L (ref 135–145)
Total Bilirubin: 0.7 mg/dL (ref 0.0–1.2)
Total Protein: 7.1 g/dL (ref 6.5–8.1)

## 2023-07-23 LAB — MAGNESIUM: Magnesium: 1.8 mg/dL (ref 1.7–2.4)

## 2023-07-23 LAB — CEA (ACCESS): CEA (CHCC): 2.71 ng/mL (ref 0.00–5.00)

## 2023-07-23 MED ORDER — SODIUM CHLORIDE 0.9 % IV SOLN
6.0000 mg/kg | Freq: Once | INTRAVENOUS | Status: AC
  Administered 2023-07-23: 600 mg via INTRAVENOUS
  Filled 2023-07-23: qty 20

## 2023-07-23 MED ORDER — LEUCOVORIN CALCIUM INJECTION 350 MG
400.0000 mg/m2 | Freq: Once | INTRAVENOUS | Status: AC
  Administered 2023-07-23: 836 mg via INTRAVENOUS
  Filled 2023-07-23: qty 41.8

## 2023-07-23 MED ORDER — LEUCOVORIN CALCIUM INJECTION 350 MG
400.0000 mg/m2 | Freq: Once | INTRAMUSCULAR | Status: DC
  Filled 2023-07-23: qty 41.8

## 2023-07-23 MED ORDER — SODIUM CHLORIDE 0.9 % IV SOLN: Freq: Once | INTRAVENOUS | Status: AC

## 2023-07-23 MED ORDER — FLUOROURACIL CHEMO INJECTION 5 GM/100ML
2400.0000 mg/m2 | INTRAVENOUS | Status: DC
  Administered 2023-07-23: 5000 mg via INTRAVENOUS
  Filled 2023-07-23: qty 100

## 2023-07-23 MED ORDER — FLUOROURACIL CHEMO INJECTION 2.5 GM/50ML
400.0000 mg/m2 | Freq: Once | INTRAVENOUS | Status: AC
  Administered 2023-07-23: 850 mg via INTRAVENOUS
  Filled 2023-07-23: qty 17

## 2023-07-23 NOTE — Patient Instructions (Signed)

## 2023-07-23 NOTE — Patient Instructions (Signed)
 [facility] - A DEPT OF [facility]  Discharge Instructions: Thank you for choosing [facility] to provide your oncology and hematology care.   If you have a lab appointment with the Cancer Center, please go directly to the Cancer Center and check in at the registration area.   Wear comfortable clothing and clothing appropriate for easy access to any Portacath or PICC line.   We strive to give you quality time with your provider. You may need to reschedule your appointment if you arrive late (15 or more minutes).  Arriving late affects you and other patients whose appointments are after yours.  Also, if you miss three or more appointments without notifying the office, you may be dismissed from the clinic at the provider's discretion.      For prescription refill requests, have your pharmacy contact our office and allow 72 hours for refills to be completed.    Today you received the following chemotherapy and/or immunotherapy agents Vectibix , Leucovorin  and Adrucil       To help prevent nausea and vomiting after your treatment, we encourage you to take your nausea medication as directed.  BELOW ARE SYMPTOMS THAT SHOULD BE REPORTED IMMEDIATELY: *FEVER GREATER THAN 100.4 F (38 C) OR HIGHER *CHILLS OR SWEATING *NAUSEA AND VOMITING THAT IS NOT CONTROLLED WITH YOUR NAUSEA MEDICATION *UNUSUAL SHORTNESS OF BREATH *UNUSUAL BRUISING OR BLEEDING *URINARY PROBLEMS (pain or burning when urinating, or frequent urination) *BOWEL PROBLEMS (unusual diarrhea, constipation, pain near the anus) TENDERNESS IN MOUTH AND THROAT WITH OR WITHOUT PRESENCE OF ULCERS (sore throat, sores in mouth, or a toothache) UNUSUAL RASH, SWELLING OR PAIN  UNUSUAL VAGINAL DISCHARGE OR ITCHING   Items with * indicate a potential emergency and should be followed up as soon as possible or go to the Emergency Department if any problems should occur.  Please show the CHEMOTHERAPY ALERT  CARD or IMMUNOTHERAPY ALERT CARD at check-in to the Emergency Department and triage nurse.  Should you have questions after your visit or need to cancel or reschedule your appointment, please contact [facility] - A DEPT OF [facility]  Dept: [phone]  and follow the prompts.  Office hours are 8:00 a.m. to 4:30 p.m. Monday - Friday. Please note that voicemails left after 4:00 p.m. may not be returned until the following business day.  We are closed weekends and major holidays. You have access to a nurse at all times for urgent questions. Please call the main number to the clinic Dept: [phone] and follow the prompts.   For any non-urgent questions, you may also contact your provider using MyChart. We now offer e-Visits for anyone 45 and older to request care online for non-urgent symptoms. For details visit [email or web address].   Also download the MyChart app! Go to the app store, search "MyChart", open the app, select [place], and log in with your MyChart username and password.

## 2023-07-23 NOTE — Progress Notes (Signed)
 [facility] OFFICE PROGRESS NOTE   Diagnosis: Colon cancer  INTERVAL HISTORY:   Daniel [name] returns as scheduled.  He completed cycle of 5-FU/panitumumab  on 07/05/2023.  No nausea/vomiting or diarrhea.  He continues to have numbness in the fingertips.  This does not interfere with activity.  No numbness in the feet.  Objective:  Vital signs in last 24 hours:  Blood pressure 108/81, pulse 100, temperature 98.2 F (36.8 C), temperature source Temporal, resp. rate 18, height 5' 6 (1.676 m), weight 206 lb 8 oz (93.7 kg), SpO2 100%.    HEENT: No buccal thrush or ulcers, mild white coat over the tongue Resp: Lungs clear bilaterally Cardio: Regular rate and rhythm GI: No hepatosplenomegaly, nontender Vascular: No leg edema  Skin: Dry acne type rash over the trunk, few areas of paronychia at the fingers, inflammation at the medial aspect of the right great toenail  Portacath/PICC-without erythema  Lab Results:  Lab Results  Component Value Date   WBC 6.7 07/03/2023   HGB 14.7 07/03/2023   HCT 44.4 07/03/2023   MCV 94.7 07/03/2023   PLT 74 (L) 07/03/2023   NEUTROABS 3.3 07/03/2023    CMP  Lab Results  Component Value Date   NA 140 07/03/2023   K 3.8 07/03/2023   CL 105 07/03/2023   CO2 29 07/03/2023   GLUCOSE 112 (H) 07/03/2023   BUN 10 07/03/2023   CREATININE 0.63 07/03/2023   CALCIUM  8.9 07/03/2023   PROT 6.8 07/03/2023   ALBUMIN 3.6 07/03/2023   AST 35 07/03/2023   ALT 30 07/03/2023   ALKPHOS 115 07/03/2023   BILITOT 0.6 07/03/2023   GFRNONAA >60 07/03/2023   GFRAA >60 07/23/2016    Lab Results  Component Value Date   CEA 3.80 07/03/2023     Medications: I have reviewed the patient's current medications.   Assessment/Plan: Sigmoid colon mass, multiple liver lesions Ultrasound abdomen 01/08/2023-multifocal hyperechoic liver lesions, largest measuring 8.7 cm in the right hepatic lobe CT abdomen/pelvis 01/08/2023-multiple by lobar  hypoattenuating hepatic lesions, masslike thickening of the sigmoid colon with abnormal spiculated pericolonic soft tissue.  IMA lymphadenopathy.  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 Colonoscopy 01/29/2023-malignant tumor in the sigmoid colon (invasive moderately differentiated adenocarcinoma), four 6-9 mm polyps in the rectum, ascending colon and cecum, one 14 mm polyp in the sigmoid colon (within the rectal polyp biopsies there are 2 similar-appearing fragments showing invasive tumor with associated adenomatous change which is felt to be carryover from the sigmoid mass biopsies, the rectal polyp biopsies also show multiple fragments of tubular adenoma without high-grade dysplasia) Cycle 1 FOLFOX 01/30/2023 Cycle 2 FOLFOX 02/12/2023 Cycle 3 FOLFOX plus Panitumumab  02/27/2023 Cycle 4 FOLFOX plus Panitumumab  03/13/2023 Cycle 5 FOLFOX plus panitumumab  03/27/2023 Cycle 6 FOLFOX plus Panitumumab  04/10/2023 CTs 04/19/2023-decrease size of hepatic metastases, no new lesions, decreased right upper quadrant and retroperitoneal lymph nodes, sigmoid colonic wall thickening no longer seen with a decrease in sigmoid mesenteric lymph nodes Cycle 7 FOLFOX plus panitumumab  04/24/2023, oxaliplatin  dose reduced secondary to thrombocytopenia and neuropathy Cycle 8 FOLFOX plus Panitumumab  05/08/2023 Cycle 9 FOLFOX plus Panitumumab  05/22/2023 Cycle 10 FOLFOX plus Panitumumab  06/05/2023 Cycle 11 FOLFOX plus panitumumab  06/19/2023 Cycle 12 FOLFOX plus Panitumumab  07/03/2023, oxaliplatin  held due to thrombocytopenia CTs 07/11/2023: Decrease in hepatic metastases, no enlarged abdominal or pelvic nodes, no metastatic disease to  the chest Cycle 13 of  5-FU/panitumumab  Diabetes Cardiomyopathy Hypertension Hypercholesterolemia Weight loss secondary to [place], metastatic carcinoma? 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  I reviewed the restaging CT findings and images with him.  There is evidence of a continued response to treatment.  He has developed oxaliplatin  neuropathy.  He completed 11 treatments with oxaliplatin .  Oxaliplatin  will be placed on hold.  The plan is to continue treatment with 5-FU/panitumumab .  Daniel [name] complete another treatment today.  He will use Vaseline and Neosporin at the areas of paronychia.  He will return for an office visit and chemotherapy in 2 weeks.  Arley Hof, MD  07/23/2023  8:10 AM

## 2023-07-23 NOTE — Progress Notes (Signed)
 Patient seen by Dr. Arley Hof today  Vitals are within treatment parameters:Yes   Labs are within treatment parameters: Yes   Treatment plan has been signed: Yes   Per physician team, Patient is ready for treatment and there are NO modifications to the treatment plan.

## 2023-07-25 ENCOUNTER — Inpatient Hospital Stay: Payer: Medicare Other | Attending: Oncology

## 2023-07-25 VITALS — BP 104/73 | HR 106 | Temp 98.1°F | Resp 18

## 2023-07-25 DIAGNOSIS — C787 Secondary malignant neoplasm of liver and intrahepatic bile duct: Secondary | ICD-10-CM | POA: Insufficient documentation

## 2023-07-25 DIAGNOSIS — Z5112 Encounter for antineoplastic immunotherapy: Secondary | ICD-10-CM | POA: Diagnosis present

## 2023-07-25 DIAGNOSIS — Z5111 Encounter for antineoplastic chemotherapy: Secondary | ICD-10-CM | POA: Diagnosis present

## 2023-07-25 DIAGNOSIS — D509 Iron deficiency anemia, unspecified: Secondary | ICD-10-CM | POA: Insufficient documentation

## 2023-07-25 DIAGNOSIS — C187 Malignant neoplasm of sigmoid colon: Secondary | ICD-10-CM | POA: Insufficient documentation

## 2023-07-25 MED ORDER — HEPARIN SOD (PORK) LOCK FLUSH 100 UNIT/ML IV SOLN
500.0000 [IU] | Freq: Once | INTRAVENOUS | Status: AC | PRN
  Administered 2023-07-25: 500 [IU]

## 2023-07-25 MED ORDER — SODIUM CHLORIDE 0.9% FLUSH
10.0000 mL | INTRAVENOUS | Status: DC | PRN
  Administered 2023-07-25: 10 mL

## 2023-07-25 NOTE — Patient Instructions (Signed)

## 2023-07-26 ENCOUNTER — Other Ambulatory Visit: Payer: Self-pay

## 2023-08-03 ENCOUNTER — Other Ambulatory Visit: Payer: Self-pay | Admitting: Oncology

## 2023-08-03 DIAGNOSIS — C187 Malignant neoplasm of sigmoid colon: Secondary | ICD-10-CM

## 2023-08-07 ENCOUNTER — Inpatient Hospital Stay: Payer: Medicare Other

## 2023-08-07 ENCOUNTER — Inpatient Hospital Stay (HOSPITAL_BASED_OUTPATIENT_CLINIC_OR_DEPARTMENT_OTHER): Payer: Medicare Other | Admitting: Nurse Practitioner

## 2023-08-07 ENCOUNTER — Encounter: Payer: Self-pay | Admitting: Nurse Practitioner

## 2023-08-07 VITALS — BP 106/79 | HR 97 | Temp 98.1°F | Resp 18 | Ht 66.0 in | Wt 206.7 lb

## 2023-08-07 DIAGNOSIS — Z5112 Encounter for antineoplastic immunotherapy: Secondary | ICD-10-CM | POA: Diagnosis not present

## 2023-08-07 DIAGNOSIS — C187 Malignant neoplasm of sigmoid colon: Secondary | ICD-10-CM | POA: Diagnosis not present

## 2023-08-07 LAB — CBC WITH DIFFERENTIAL (CANCER CENTER ONLY)
Abs Immature Granulocytes: 0.01 10*3/uL (ref 0.00–0.07)
Basophils Absolute: 0.1 10*3/uL (ref 0.0–0.1)
Basophils Relative: 1 %
Eosinophils Absolute: 0.3 10*3/uL (ref 0.0–0.5)
Eosinophils Relative: 4 %
HCT: 43.8 % (ref 39.0–52.0)
Hemoglobin: 14.7 g/dL (ref 13.0–17.0)
Immature Granulocytes: 0 %
Lymphocytes Relative: 28 %
Lymphs Abs: 1.8 10*3/uL (ref 0.7–4.0)
MCH: 32.5 pg (ref 26.0–34.0)
MCHC: 33.6 g/dL (ref 30.0–36.0)
MCV: 96.7 fL (ref 80.0–100.0)
Monocytes Absolute: 0.7 10*3/uL (ref 0.1–1.0)
Monocytes Relative: 10 %
Neutro Abs: 3.7 10*3/uL (ref 1.7–7.7)
Neutrophils Relative %: 57 %
Platelet Count: 120 10*3/uL — ABNORMAL LOW (ref 150–400)
RBC: 4.53 MIL/uL (ref 4.22–5.81)
RDW: 15.8 % — ABNORMAL HIGH (ref 11.5–15.5)
WBC Count: 6.5 10*3/uL (ref 4.0–10.5)
nRBC: 0 % (ref 0.0–0.2)

## 2023-08-07 LAB — CMP (CANCER CENTER ONLY)
ALT: 31 U/L (ref 0–44)
AST: 36 U/L (ref 15–41)
Albumin: 4 g/dL (ref 3.5–5.0)
Alkaline Phosphatase: 115 U/L (ref 38–126)
Anion gap: 9 (ref 5–15)
BUN: 10 mg/dL (ref 6–20)
CO2: 28 mmol/L (ref 22–32)
Calcium: 8.9 mg/dL (ref 8.9–10.3)
Chloride: 104 mmol/L (ref 98–111)
Creatinine: 0.68 mg/dL (ref 0.61–1.24)
GFR, Estimated: >60 mL/min (ref >60)
Glucose, Bld: 127 mg/dL — ABNORMAL HIGH (ref 70–99)
Potassium: 3.4 mmol/L — ABNORMAL LOW (ref 3.5–5.1)
Sodium: 141 mmol/L (ref 135–145)
Total Bilirubin: 0.6 mg/dL (ref 0.0–1.2)
Total Protein: 7.1 g/dL (ref 6.5–8.1)

## 2023-08-07 LAB — CEA (ACCESS): CEA (CHCC): 3.18 ng/mL (ref 0.00–5.00)

## 2023-08-07 LAB — MAGNESIUM: Magnesium: 1.7 mg/dL (ref 1.7–2.4)

## 2023-08-07 MED ORDER — FLUOROURACIL CHEMO INJECTION 5 GM/100ML
2400.0000 mg/m2 | INTRAVENOUS | Status: DC
  Administered 2023-08-07: 5000 mg via INTRAVENOUS
  Filled 2023-08-07: qty 100

## 2023-08-07 MED ORDER — SODIUM CHLORIDE 0.9 % IV SOLN
6.0000 mg/kg | Freq: Once | INTRAVENOUS | Status: AC
  Administered 2023-08-07: 600 mg via INTRAVENOUS
  Filled 2023-08-07: qty 20

## 2023-08-07 MED ORDER — LEUCOVORIN CALCIUM INJECTION 350 MG
400.0000 mg/m2 | Freq: Once | INTRAMUSCULAR | Status: AC
  Administered 2023-08-07: 836 mg via INTRAVENOUS
  Filled 2023-08-07: qty 41.8

## 2023-08-07 MED ORDER — FLUOROURACIL CHEMO INJECTION 2.5 GM/50ML
400.0000 mg/m2 | Freq: Once | INTRAVENOUS | Status: AC
  Administered 2023-08-07: 850 mg via INTRAVENOUS
  Filled 2023-08-07: qty 17

## 2023-08-07 MED ORDER — SODIUM CHLORIDE 0.9 % IV SOLN: Freq: Once | INTRAVENOUS | Status: AC

## 2023-08-07 NOTE — Progress Notes (Signed)
 Daniel [name]   Diagnosis:  Colon cancer  INTERVAL HISTORY:   Daniel [name] returns as scheduled.  He completed another cycle of 5-FU/Panitumumab  08/07/2023.  Oxaliplatin  is on hold due to neuropathy.  He continues to have tingling in the fingertips with intermittent difficulty with certain activities such as putting a on a bottle.  No symptoms in the feet.  No nausea or vomiting.  He had a single mouth sore which did not interfere with his ability to eat/drink and resolved quickly.  No pain or redness on the palms/soles.  He notes redness and minor irritation along the lateral edge of the right great toenail bed.  Objective:  Vital signs in last 24 hours:  Blood pressure 106/79, pulse 97, temperature 98.1 F (36.7 C), temperature source Temporal, resp. rate 18, height 5\' 6"  (1.676 m), weight 206 lb 11.2 oz (93.8 kg), SpO2 100%.    HEENT: No thrush or ulcers. Resp: Lungs clear bilaterally. Cardio: Regular rate and rhythm. GI: Abdomen soft and nontender.  No hepatosplenomegaly. Vascular: No leg edema. Skin: Palms without erythema.  Lateral edge of the right great toe nailbed is mildly erythematous, no drainage.  Dry appearing acne type rash mainly over the trunk. Port-A-Cath without erythema.  Lab Results:  Lab Results  Component Value Date   WBC 6.5 08/07/2023   HGB 14.7 08/07/2023   HCT 43.8 08/07/2023   MCV 96.7 08/07/2023   PLT 120 (L) 08/07/2023   NEUTROABS 3.7 08/07/2023    Imaging:  No results found.  Medications: I have reviewed the patient's current medications.  Assessment/Plan: Sigmoid colon mass, multiple liver lesions Ultrasound abdomen 01/08/2023-multifocal hyperechoic liver lesions, largest measuring 8.7 cm in the right hepatic lobe CT abdomen/pelvis 01/08/2023-multiple by lobar hypoattenuating hepatic lesions, masslike thickening of the sigmoid colon with abnormal spiculated pericolonic soft tissue.  IMA lymphadenopathy.   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 Colonoscopy 01/29/2023-malignant tumor in the sigmoid colon (invasive moderately differentiated adenocarcinoma), four 6-9 mm polyps in the rectum, ascending colon and cecum, one 14 mm polyp in the sigmoid colon (within the rectal polyp biopsies there are 2 similar-appearing fragments showing invasive tumor with associated adenomatous change which is felt to be carryover from the sigmoid mass biopsies, the rectal polyp biopsies also show multiple fragments of tubular adenoma without high-grade dysplasia) Cycle 1 FOLFOX 01/30/2023 Cycle 2 FOLFOX 02/12/2023 Cycle 3 FOLFOX plus Panitumumab  02/27/2023 Cycle 4 FOLFOX plus Panitumumab  03/13/2023 Cycle 5 FOLFOX plus panitumumab  03/27/2023 Cycle 6 FOLFOX plus Panitumumab  04/10/2023 CTs 04/19/2023-decrease size of hepatic metastases, no new lesions, decreased right upper quadrant and retroperitoneal lymph nodes, sigmoid colonic wall thickening no longer seen with a decrease in sigmoid mesenteric lymph nodes Cycle 7 FOLFOX plus panitumumab  04/24/2023, oxaliplatin  dose reduced secondary to thrombocytopenia and neuropathy Cycle 8 FOLFOX plus Panitumumab  05/08/2023 Cycle 9 FOLFOX plus Panitumumab  05/22/2023 Cycle 10 FOLFOX plus Panitumumab  06/05/2023 Cycle 11 FOLFOX plus panitumumab  06/19/2023 Cycle 12 FOLFOX plus Panitumumab  07/03/2023, oxaliplatin  held due to thrombocytopenia CTs 07/11/2023: Decrease in hepatic metastases, no enlarged abdominal or pelvic nodes, no metastatic disease to the chest Cycle 13 of 5-FU/panitumumab  07/23/2023 Cycle 14 5-FU/Panitumumab  08/07/2023 Diabetes Cardiomyopathy Hypertension Hypercholesterolemia Weight loss secondary to [place], metastatic  carcinoma? 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 appears stable.  He has completed 13  cycles of systemic therapy.  Oxaliplatin  has been removed from the regimen due to neuropathy.  There is no clinical evidence of disease progression.  Most recent CEA further improved in the normal range.  Plan to continue 5-FU/Panitumumab .  CBC and chemistry panel reviewed.  Labs adequate to proceed as above.  He will continue Neosporin and a Band-Aid at the areas of paronychia.  He will return for follow-up and treatment in 2 weeks.  Diana Forster ANP/GNP-BC   08/07/2023  8:41 AM

## 2023-08-07 NOTE — Progress Notes (Addendum)
 Patient seen by Diana Forster NP today  Vitals are within treatment parameters:Yes   Labs are within treatment parameters: Yes k+ 3.4  Treatment plan has been signed: Yes   Per physician team, Patient is ready for treatment and there are NO modifications to the treatment plan. Can you please drawn a CEA?

## 2023-08-07 NOTE — Patient Instructions (Signed)
 [facility] - A DEPT OF [facility]  Discharge Instructions: Thank you for choosing [facility] to provide your oncology and hematology care.   If you have a lab appointment with the Cancer Center, please go directly to the Cancer Center and check in at the registration area.   Wear comfortable clothing and clothing appropriate for easy access to any Portacath or PICC line.   We strive to give you quality time with your provider. You may need to reschedule your appointment if you arrive late (15 or more minutes).  Arriving late affects you and other patients whose appointments are after yours.  Also, if you miss three or more appointments without notifying the office, you may be dismissed from the clinic at the provider's discretion.      For prescription refill requests, have your pharmacy contact our office and allow 72 hours for refills to be completed.    Today you received the following chemotherapy and/or immunotherapy agents Vectibix , Leucovorin  and Adrucil       To help prevent nausea and vomiting after your treatment, we encourage you to take your nausea medication as directed.  BELOW ARE SYMPTOMS THAT SHOULD BE REPORTED IMMEDIATELY: *FEVER GREATER THAN 100.4 F (38 C) OR HIGHER *CHILLS OR SWEATING *NAUSEA AND VOMITING THAT IS NOT CONTROLLED WITH YOUR NAUSEA MEDICATION *UNUSUAL SHORTNESS OF BREATH *UNUSUAL BRUISING OR BLEEDING *URINARY PROBLEMS (pain or burning when urinating, or frequent urination) *BOWEL PROBLEMS (unusual diarrhea, constipation, pain near the anus) TENDERNESS IN MOUTH AND THROAT WITH OR WITHOUT PRESENCE OF ULCERS (sore throat, sores in mouth, or a toothache) UNUSUAL RASH, SWELLING OR PAIN  UNUSUAL VAGINAL DISCHARGE OR ITCHING   Items with * indicate a potential emergency and should be followed up as soon as possible or go to the Emergency Department if any problems should occur.  Please show the CHEMOTHERAPY ALERT  CARD or IMMUNOTHERAPY ALERT CARD at check-in to the Emergency Department and triage nurse.  Should you have questions after your visit or need to cancel or reschedule your appointment, please contact [facility] - A DEPT OF [facility]  Dept: [phone]  and follow the prompts.  Office hours are 8:00 a.m. to 4:30 p.m. Monday - Friday. Please note that voicemails left after 4:00 p.m. may not be returned until the following business day.  We are closed weekends and major holidays. You have access to a nurse at all times for urgent questions. Please call the main number to the clinic Dept: [phone] and follow the prompts.   For any non-urgent questions, you may also contact your provider using MyChart. We now offer e-Visits for anyone 45 and older to request care online for non-urgent symptoms. For details visit [email or web address].   Also download the MyChart app! Go to the app store, search "MyChart", open the app, select [place], and log in with your MyChart username and password.

## 2023-08-07 NOTE — Patient Instructions (Signed)

## 2023-08-08 ENCOUNTER — Other Ambulatory Visit: Payer: Self-pay

## 2023-08-09 ENCOUNTER — Inpatient Hospital Stay: Payer: Medicare Other

## 2023-08-09 VITALS — BP 94/68 | HR 94 | Temp 98.3°F | Resp 18

## 2023-08-09 DIAGNOSIS — Z5112 Encounter for antineoplastic immunotherapy: Secondary | ICD-10-CM | POA: Diagnosis not present

## 2023-08-09 DIAGNOSIS — C187 Malignant neoplasm of sigmoid colon: Secondary | ICD-10-CM

## 2023-08-09 MED ORDER — SODIUM CHLORIDE 0.9% FLUSH
10.0000 mL | INTRAVENOUS | Status: DC | PRN
  Administered 2023-08-09: 10 mL

## 2023-08-09 MED ORDER — HEPARIN SOD (PORK) LOCK FLUSH 100 UNIT/ML IV SOLN
500.0000 [IU] | Freq: Once | INTRAVENOUS | Status: AC | PRN
Start: 2023-08-09 — End: 2023-08-09
  Administered 2023-08-09: 500 [IU]

## 2023-08-09 NOTE — Patient Instructions (Signed)

## 2023-08-14 ENCOUNTER — Encounter: Payer: Self-pay | Admitting: Oncology

## 2023-08-17 ENCOUNTER — Other Ambulatory Visit: Payer: Self-pay | Admitting: Oncology

## 2023-08-21 ENCOUNTER — Inpatient Hospital Stay: Payer: Medicare Other

## 2023-08-21 ENCOUNTER — Other Ambulatory Visit: Payer: Self-pay

## 2023-08-21 ENCOUNTER — Inpatient Hospital Stay (HOSPITAL_BASED_OUTPATIENT_CLINIC_OR_DEPARTMENT_OTHER): Payer: Medicare Other | Admitting: Oncology

## 2023-08-21 ENCOUNTER — Encounter: Payer: Self-pay | Admitting: Oncology

## 2023-08-21 VITALS — BP 104/77 | HR 93 | Temp 98.1°F | Resp 18 | Ht 66.0 in | Wt 207.3 lb

## 2023-08-21 VITALS — BP 107/76 | HR 90 | Resp 18

## 2023-08-21 DIAGNOSIS — C187 Malignant neoplasm of sigmoid colon: Secondary | ICD-10-CM

## 2023-08-21 DIAGNOSIS — Z5112 Encounter for antineoplastic immunotherapy: Secondary | ICD-10-CM | POA: Diagnosis not present

## 2023-08-21 LAB — CBC WITH DIFFERENTIAL (CANCER CENTER ONLY)
Abs Immature Granulocytes: 0.02 10*3/uL (ref 0.00–0.07)
Basophils Absolute: 0.1 10*3/uL (ref 0.0–0.1)
Basophils Relative: 1 %
Eosinophils Absolute: 0.2 10*3/uL (ref 0.0–0.5)
Eosinophils Relative: 3 %
HCT: 44.8 % (ref 39.0–52.0)
Hemoglobin: 15.1 g/dL (ref 13.0–17.0)
Immature Granulocytes: 0 %
Lymphocytes Relative: 26 %
Lymphs Abs: 1.9 10*3/uL (ref 0.7–4.0)
MCH: 33.1 pg (ref 26.0–34.0)
MCHC: 33.7 g/dL (ref 30.0–36.0)
MCV: 98.2 fL (ref 80.0–100.0)
Monocytes Absolute: 0.7 10*3/uL (ref 0.1–1.0)
Monocytes Relative: 10 %
Neutro Abs: 4.3 10*3/uL (ref 1.7–7.7)
Neutrophils Relative %: 60 %
Platelet Count: 122 10*3/uL — ABNORMAL LOW (ref 150–400)
RBC: 4.56 MIL/uL (ref 4.22–5.81)
RDW: 14.9 % (ref 11.5–15.5)
WBC Count: 7.2 10*3/uL (ref 4.0–10.5)
nRBC: 0 % (ref 0.0–0.2)

## 2023-08-21 LAB — CMP (CANCER CENTER ONLY)
ALT: 31 U/L (ref 0–44)
AST: 33 U/L (ref 15–41)
Albumin: 3.8 g/dL (ref 3.5–5.0)
Alkaline Phosphatase: 124 U/L (ref 38–126)
Anion gap: 8 (ref 5–15)
BUN: 16 mg/dL (ref 6–20)
CO2: 27 mmol/L (ref 22–32)
Calcium: 9.4 mg/dL (ref 8.9–10.3)
Chloride: 106 mmol/L (ref 98–111)
Creatinine: 0.75 mg/dL (ref 0.61–1.24)
GFR, Estimated: >60 mL/min (ref >60)
Glucose, Bld: 112 mg/dL — ABNORMAL HIGH (ref 70–99)
Potassium: 3.9 mmol/L (ref 3.5–5.1)
Sodium: 141 mmol/L (ref 135–145)
Total Bilirubin: 0.6 mg/dL (ref 0.0–1.2)
Total Protein: 7.4 g/dL (ref 6.5–8.1)

## 2023-08-21 LAB — MAGNESIUM: Magnesium: 1.8 mg/dL (ref 1.7–2.4)

## 2023-08-21 LAB — CEA (ACCESS): CEA (CHCC): 3.27 ng/mL (ref 0.00–5.00)

## 2023-08-21 MED ORDER — FLUOROURACIL CHEMO INJECTION 2.5 GM/50ML
400.0000 mg/m2 | Freq: Once | INTRAVENOUS | Status: AC
  Administered 2023-08-21: 850 mg via INTRAVENOUS
  Filled 2023-08-21: qty 17

## 2023-08-21 MED ORDER — SODIUM CHLORIDE 0.9 % IV SOLN
2400.0000 mg/m2 | INTRAVENOUS | Status: DC
  Administered 2023-08-21: 5000 mg via INTRAVENOUS
  Filled 2023-08-21: qty 100

## 2023-08-21 MED ORDER — LEUCOVORIN CALCIUM INJECTION 350 MG
400.0000 mg/m2 | Freq: Once | INTRAVENOUS | Status: AC
  Administered 2023-08-21: 836 mg via INTRAVENOUS
  Filled 2023-08-21: qty 41.8

## 2023-08-21 MED ORDER — SODIUM CHLORIDE 0.9 % IV SOLN: Freq: Once | INTRAVENOUS | Status: AC

## 2023-08-21 MED ORDER — ONDANSETRON HCL 8 MG PO TABS: 8.0000 mg | ORAL_TABLET | Freq: Three times a day (TID) | ORAL | 0 refills | Status: AC | PRN

## 2023-08-21 MED ORDER — SODIUM CHLORIDE 0.9 % IV SOLN
6.0000 mg/kg | Freq: Once | INTRAVENOUS | Status: AC
  Administered 2023-08-21: 600 mg via INTRAVENOUS
  Filled 2023-08-21: qty 20

## 2023-08-21 NOTE — Progress Notes (Signed)
[facility] OFFICE PROGRESS NOTE   Diagnosis: Colon cancer  INTERVAL HISTORY:   Mr. Vandehei completed a cycle of 5-FU/panitumumab on 08/07/2023.  No nausea/vomiting, mouth sores, or diarrhea.  He reports cyst and numbness in the fingertips.  This does not interfere with activity.  Stable skin rash.  He has persistent linear ulcerations at the dorsum of the hands.  Objective:  Vital signs in last 24 hours:  Blood pressure 104/77, pulse 93, temperature 98.1 F (36.7 C), temperature source Temporal, resp. rate 18, height 5\' 6"  (1.676 m), weight 207 lb 4.8 oz (94 kg), SpO2 100%.    HEENT: No thrush or ulcers Resp: Lungs clear bilaterally Cardio: Regular rate and rhythm GI: No hepatosplenomegaly, nontender Vascular: No leg edema  Skin: Acne type rash over the trunk, several linear ulcerations at the dorsum of the fingers  Portacath/PICC-without erythema  Lab Results:  Lab Results  Component Value Date   WBC 6.5 08/07/2023   HGB 14.7 08/07/2023   HCT 43.8 08/07/2023   MCV 96.7 08/07/2023   PLT 120 (L) 08/07/2023   NEUTROABS 3.7 08/07/2023    CMP  Lab Results  Component Value Date   NA 141 08/07/2023   K 3.4 (L) 08/07/2023   CL 104 08/07/2023   CO2 28 08/07/2023   GLUCOSE 127 (H) 08/07/2023   BUN 10 08/07/2023   CREATININE 0.68 08/07/2023   CALCIUM 8.9 08/07/2023   PROT 7.1 08/07/2023   ALBUMIN 4.0 08/07/2023   AST 36 08/07/2023   ALT 31 08/07/2023   ALKPHOS 115 08/07/2023   BILITOT 0.6 08/07/2023   GFRNONAA >60 08/07/2023   GFRAA >60 07/23/2016    Lab Results  Component Value Date   CEA 3.18 08/07/2023    Medications: I have reviewed the patient's current medications.   Assessment/Plan: Sigmoid colon mass, multiple liver lesions Ultrasound abdomen 01/08/2023-multifocal hyperechoic liver lesions, largest measuring 8.7 cm in the right hepatic lobe CT abdomen/pelvis 01/08/2023-multiple by lobar hypoattenuating hepatic lesions, masslike  thickening of the sigmoid colon with abnormal spiculated pericolonic soft tissue.  IMA lymphadenopathy.  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 Colonoscopy 01/29/2023-malignant tumor in the sigmoid colon (invasive moderately differentiated adenocarcinoma), four 6-9 mm polyps in the rectum, ascending colon and cecum, one 14 mm polyp in the sigmoid colon (within the rectal polyp biopsies there are 2 similar-appearing fragments showing invasive tumor with associated adenomatous change which is felt to be carryover from the sigmoid mass biopsies, the rectal polyp biopsies also show multiple fragments of tubular adenoma without high-grade dysplasia) Cycle 1 FOLFOX 01/30/2023 Cycle 2 FOLFOX 02/12/2023 Cycle 3 FOLFOX plus Panitumumab 02/27/2023 Cycle 4 FOLFOX plus Panitumumab 03/13/2023 Cycle 5 FOLFOX plus panitumumab 03/27/2023 Cycle 6 FOLFOX plus Panitumumab 04/10/2023 CTs 04/19/2023-decrease size of hepatic metastases, no new lesions, decreased right upper quadrant and retroperitoneal lymph nodes, sigmoid colonic wall thickening no longer seen with a decrease in sigmoid mesenteric lymph nodes Cycle 7 FOLFOX plus panitumumab 04/24/2023, 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 Cycle 8 FOLFOX plus Panitumumab 05/08/2023 Cycle 9 FOLFOX plus Panitumumab 05/22/2023 Cycle 10 FOLFOX plus Panitumumab 06/05/2023 Cycle 11 FOLFOX plus panitumumab 06/19/2023 Cycle 12 FOLFOX plus Panitumumab 07/03/2023, oxaliplatin held due to thrombocytopenia CTs 07/11/2023: Decrease in hepatic metastases, no enlarged abdominal or pelvic nodes, no metastatic disease to the chest Cycle 13 of 5-FU/panitumumab 07/23/2023 Cycle  14 5-FU/Panitumumab 08/07/2023 Cycle 15  5-FU/panitumumab 08/21/2023 Diabetes Cardiomyopathy Hypertension Hypercholesterolemia Weight loss secondary to [facility], metastatic carcinoma? 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  He tolerated the last cycle of chemotherapy well.  He will complete another cycle of 5-FU/panitumumab today.  He will continue Neosporin on the areas of skin ulceration.  He continues doxycycline.  He will return for an office visit and treatment in 2 weeks.  Thornton Papas, MD  08/21/2023  8:30 AM

## 2023-08-21 NOTE — Progress Notes (Signed)
Patient seen by Dr. Thornton Papas today  Vitals are within treatment parameters:Yes   Labs are within treatment parameters: Yes   Treatment plan has been signed: Yes   Per physician team, Patient is ready for treatment and there are NO modifications to the treatment plan.

## 2023-08-21 NOTE — Patient Instructions (Signed)
[facility]   Discharge Instructions: Thank you for choosing [facility] to provide your oncology and hematology care.   If you have a lab appointment with the Cancer Center, please go directly to the Cancer Center and check in at the registration area.   Wear comfortable clothing and clothing appropriate for easy access to any Portacath or PICC line.   We strive to give you quality time with your provider. You may need to reschedule your appointment if you arrive late (15 or more minutes).  Arriving late affects you and other patients whose appointments are after yours.  Also, if you miss three or more appointments without notifying the office, you may be dismissed from the clinic at the provider's discretion.      For prescription refill requests, have your pharmacy contact our office and allow 72 hours for refills to be completed.    Today you received the following chemotherapy and/or immunotherapy agents Panitumumab (VECTIBIX), Leucovorin & Flourouracil (ADRUCIL).      To help prevent nausea and vomiting after your treatment, we encourage you to take your nausea medication as directed.  BELOW ARE SYMPTOMS THAT SHOULD BE REPORTED IMMEDIATELY: *FEVER GREATER THAN 100.4 F (38 C) OR HIGHER *CHILLS OR SWEATING *NAUSEA AND VOMITING THAT IS NOT CONTROLLED WITH YOUR NAUSEA MEDICATION *UNUSUAL SHORTNESS OF BREATH *UNUSUAL BRUISING OR BLEEDING *URINARY PROBLEMS (pain or burning when urinating, or frequent urination) *BOWEL PROBLEMS (unusual diarrhea, constipation, pain near the anus) TENDERNESS IN MOUTH AND THROAT WITH OR WITHOUT PRESENCE OF ULCERS (sore throat, sores in mouth, or a toothache) UNUSUAL RASH, SWELLING OR PAIN  UNUSUAL VAGINAL DISCHARGE OR ITCHING   Items with * indicate a potential emergency and should be followed up as soon as possible or go to the Emergency Department if any problems should  occur.  Please show the CHEMOTHERAPY ALERT CARD or IMMUNOTHERAPY ALERT CARD at check-in to the Emergency Department and triage nurse.  Should you have questions after your visit or need to cancel or reschedule your appointment, please contact [facility]  Dept: [phone]  and follow the prompts.  Office hours are 8:00 a.m. to 4:30 p.m. Monday - Friday. Please note that voicemails left after 4:00 p.m. may not be returned until the following business day.  We are closed weekends and major holidays. You have access to a nurse at all times for urgent questions. Please call the main number to the clinic Dept: [phone] and follow the prompts.   For any non-urgent questions, you may also contact your provider using MyChart. We now offer e-Visits for anyone 56 and older to request care online for non-urgent symptoms. For details visit [email or web address].   Also download the MyChart app! Go to the app store, search "MyChart", open the app, select [facility], and log in with your MyChart username and password.  Panitumumab Injection What is this medication? PANITUMUMAB (pan i TOOM ue mab) treats colorectal cancer. It works by blocking a protein that causes cancer cells to grow and multiply. This helps to slow or stop the spread of cancer cells. It is a monoclonal antibody. This medicine may be used for other purposes; ask your health care provider or pharmacist if you have questions. COMMON BRAND NAME(S): Vectibix What should I tell my care team before I take this medication? They need to know if you have any of these  conditions: Eye disease Low levels of magnesium in the blood Lung disease An unusual or allergic reaction to panitumumab, other medications, foods, dyes, or preservatives Pregnant or trying to get pregnant Breast-feeding How should I use this medication? This medication is injected into a vein. It is given by your care  team in a hospital or clinic setting. Talk to your care team about the use of this medication in children. Special care may be needed. Overdosage: If you think you have taken too much of this medicine contact a poison control center or emergency room at once. NOTE: This medicine is only for you. Do not share this medicine with others. What if I miss a dose? Keep appointments for follow-up doses. It is important not to miss your dose. Call your care team if you are unable to keep an appointment. What may interact with this medication? Bevacizumab This list may not describe all possible interactions. Give your health care provider a list of all the medicines, herbs, non-prescription drugs, or dietary supplements you use. Also tell them if you smoke, drink alcohol, or use illegal drugs. Some items may interact with your medicine. What should I watch for while using this medication? Your condition will be monitored carefully while you are receiving this medication. This medication may make you feel generally unwell. This is not uncommon as chemotherapy can affect healthy cells as well as cancer cells. Report any side effects. Continue your course of treatment even though you feel ill unless your care team tells you to stop. This medication can make you more sensitive to the sun. Keep out of the sun while receiving this medication and for 2 months after stopping therapy. If you cannot avoid being in the sun, wear protective clothing and sunscreen. Do not use sun lamps, tanning beds, or tanning booths. Check with your care team if you have severe diarrhea, nausea, and vomiting or if you sweat a lot. The loss of too much body fluid may make it dangerous for you to take this medication. This medication may cause serious skin reactions. They can happen weeks to months after starting the medication. Contact your care team right away if you notice fevers or flu-like symptoms with a rash. The rash may be red or  purple and then turn into blisters or peeling of the skin. You may also notice a red rash with swelling of the face, lips, or lymph nodes in your neck or under your arms. Talk to your care team if you may be pregnant. Serious birth defects can occur if you take this medication during pregnancy and for 2 months after the last dose. Contraception is recommended while taking this medication and for 2 months after the last dose. Your care team can help you find the option that works for you. Do not breastfeed while taking this medication and for 2 months after the last dose. This medication may cause infertility. Talk to your care team if you are concerned about your fertility. What side effects may I notice from receiving this medication? Side effects that you should report to your care team as soon as possible: Allergic reactions--skin rash, itching, hives, swelling of the face, lips, tongue, or throat Dry cough, shortness of breath or trouble breathing Eye pain, redness, irritation, or discharge with blurry or decreased vision Infusion reactions--chest pain, shortness of breath or trouble breathing, feeling faint or lightheaded Low magnesium level--muscle pain or cramps, unusual weakness or fatigue, fast or irregular heartbeat, tremors Low potassium level--muscle pain  or cramps, unusual weakness or fatigue, fast or irregular heartbeat, constipation Redness, blistering, peeling, or loosening of the skin, including inside the mouth Skin reactions on sun-exposed areas Side effects that usually do not require medical attention (report to your care team if they continue or are bothersome): Change in nail shape, thickness, or color Diarrhea Dry skin Fatigue Nausea Vomiting This list may not describe all possible side effects. Call your doctor for medical advice about side effects. You may report side effects to FDA at 1-800-FDA-1088. Where should I keep my medication? This medication is given in a  hospital or clinic. It will not be stored at home. NOTE: This sheet is a summary. It may not cover all possible information. If you have questions about this medicine, talk to your doctor, pharmacist, or health care provider.  2024 Elsevier/Gold Standard (2021-11-22 00:00:00)  Leucovorin Injection What is this medication? LEUCOVORIN (loo koe VOR in) prevents side effects from certain medications, such as methotrexate. It works by increasing folate levels. This helps protect healthy cells in your body. It may also be used to treat anemia caused by low levels of folate. It can also be used with fluorouracil, a type of chemotherapy, to treat colorectal cancer. It works by increasing the effects of fluorouracil in the body. This medicine may be used for other purposes; ask your health care provider or pharmacist if you have questions. What should I tell my care team before I take this medication? They need to know if you have any of these conditions: Anemia from low levels of vitamin B12 in the blood An unusual or allergic reaction to leucovorin, folic acid, other medications, foods, dyes, or preservatives Pregnant or trying to get pregnant Breastfeeding How should I use this medication? This medication is injected into a vein or a muscle. It is given by your care team in a hospital or clinic setting. Talk to your care team about the use of this medication in children. Special care may be needed. Overdosage: If you think you have taken too much of this medicine contact a poison control center or emergency room at once. NOTE: This medicine is only for you. Do not share this medicine with others. What if I miss a dose? Keep appointments for follow-up doses. It is important not to miss your dose. Call your care team if you are unable to keep an appointment. What may interact with this medication? Capecitabine Fluorouracil Phenobarbital Phenytoin Primidone Trimethoprim;sulfamethoxazole This list  may not describe all possible interactions. Give your health care provider a list of all the medicines, herbs, non-prescription drugs, or dietary supplements you use. Also tell them if you smoke, drink alcohol, or use illegal drugs. Some items may interact with your medicine. What should I watch for while using this medication? Your condition will be monitored carefully while you are receiving this medication. This medication may increase the side effects of 5-fluorouracil. Tell your care team if you have diarrhea or mouth sores that do not get better or that get worse. What side effects may I notice from receiving this medication? Side effects that you should report to your care team as soon as possible: Allergic reactions--skin rash, itching, hives, swelling of the face, lips, tongue, or throat This list may not describe all possible side effects. Call your doctor for medical advice about side effects. You may report side effects to FDA at 1-800-FDA-1088. Where should I keep my medication? This medication is given in a hospital or clinic. It will not  be stored at home. NOTE: This sheet is a summary. It may not cover all possible information. If you have questions about this medicine, talk to your doctor, pharmacist, or health care provider.  2024 Elsevier/Gold Standard (2021-12-12 00:00:00)  Fluorouracil Injection What is this medication? FLUOROURACIL (flure oh YOOR a sil) treats some types of cancer. It works by slowing down the growth of cancer cells. This medicine may be used for other purposes; ask your health care provider or pharmacist if you have questions. COMMON BRAND NAME(S): Adrucil What should I tell my care team before I take this medication? They need to know if you have any of these conditions: Blood disorders Dihydropyrimidine dehydrogenase (DPD) deficiency Infection, such as chickenpox, cold sores, herpes Kidney disease Liver disease Poor nutrition Recent or ongoing  radiation therapy An unusual or allergic reaction to fluorouracil, other medications, foods, dyes, or preservatives If you or your partner are pregnant or trying to get pregnant Breast-feeding How should I use this medication? This medication is injected into a vein. It is administered by your care team in a hospital or clinic setting. Talk to your care team about the use of this medication in children. Special care may be needed. Overdosage: If you think you have taken too much of this medicine contact a poison control center or emergency room at once. NOTE: This medicine is only for you. Do not share this medicine with others. What if I miss a dose? Keep appointments for follow-up doses. It is important not to miss your dose. Call your care team if you are unable to keep an appointment. What may interact with this medication? Do not take this medication with any of the following: Live virus vaccines This medication may also interact with the following: Medications that treat or prevent blood clots, such as warfarin, enoxaparin, dalteparin This list may not describe all possible interactions. Give your health care provider a list of all the medicines, herbs, non-prescription drugs, or dietary supplements you use. Also tell them if you smoke, drink alcohol, or use illegal drugs. Some items may interact with your medicine. What should I watch for while using this medication? Your condition will be monitored carefully while you are receiving this medication. This medication may make you feel generally unwell. This is not uncommon as chemotherapy can affect healthy cells as well as cancer cells. Report any side effects. Continue your course of treatment even though you feel ill unless your care team tells you to stop. In some cases, you may be given additional medications to help with side effects. Follow all directions for their use. This medication may increase your risk of getting an infection.  Call your care team for advice if you get a fever, chills, sore throat, or other symptoms of a cold or flu. Do not treat yourself. Try to avoid being around people who are sick. This medication may increase your risk to bruise or bleed. Call your care team if you notice any unusual bleeding. Be careful brushing or flossing your teeth or using a toothpick because you may get an infection or bleed more easily. If you have any dental work done, tell your dentist you are receiving this medication. Avoid taking medications that contain aspirin, acetaminophen, ibuprofen, naproxen, or ketoprofen unless instructed by your care team. These medications may hide a fever. Do not treat diarrhea with over the counter products. Contact your care team if you have diarrhea that lasts more than 2 days or if it is severe and  watery. This medication can make you more sensitive to the sun. Keep out of the sun. If you cannot avoid being in the sun, wear protective clothing and sunscreen. Do not use sun lamps, tanning beds, or tanning booths. Talk to your care team if you or your partner wish to become pregnant or think you might be pregnant. This medication can cause serious birth defects if taken during pregnancy and for 3 months after the last dose. A reliable form of contraception is recommended while taking this medication and for 3 months after the last dose. Talk to your care team about effective forms of contraception. Do not father a child while taking this medication and for 3 months after the last dose. Use a condom while having sex during this time period. Do not breastfeed while taking this medication. This medication may cause infertility. Talk to your care team if you are concerned about your fertility. What side effects may I notice from receiving this medication? Side effects that you should report to your care team as soon as possible: Allergic reactions--skin rash, itching, hives, swelling of the face, lips,  tongue, or throat Heart attack--pain or tightness in the chest, shoulders, arms, or jaw, nausea, shortness of breath, cold or clammy skin, feeling faint or lightheaded Heart failure--shortness of breath, swelling of the ankles, feet, or hands, sudden weight gain, unusual weakness or fatigue Heart rhythm changes--fast or irregular heartbeat, dizziness, feeling faint or lightheaded, chest pain, trouble breathing High ammonia level--unusual weakness or fatigue, confusion, loss of appetite, nausea, vomiting, seizures Infection--fever, chills, cough, sore throat, wounds that don't heal, pain or trouble when passing urine, general feeling of discomfort or being unwell Low red blood cell level--unusual weakness or fatigue, dizziness, headache, trouble breathing Pain, tingling, or numbness in the hands or feet, muscle weakness, change in vision, confusion or trouble speaking, loss of balance or coordination, trouble walking, seizures Redness, swelling, and blistering of the skin over hands and feet Severe or prolonged diarrhea Unusual bruising or bleeding Side effects that usually do not require medical attention (report to your care team if they continue or are bothersome): Dry skin Headache Increased tears Nausea Pain, redness, or swelling with sores inside the mouth or throat Sensitivity to light Vomiting This list may not describe all possible side effects. Call your doctor for medical advice about side effects. You may report side effects to FDA at 1-800-FDA-1088. Where should I keep my medication? This medication is given in a hospital or clinic. It will not be stored at home. NOTE: This sheet is a summary. It may not cover all possible information. If you have questions about this medicine, talk to your doctor, pharmacist, or health care provider.  2024 Elsevier/Gold Standard (2021-11-14 00:00:00)  The chemotherapy medication bag should finish at 46 hours, 96 hours, or 7 days. For example,  if your pump is scheduled for 46 hours and it was put on at 4:00 p.m., it should finish at 2:00 p.m. the day it is scheduled to come off regardless of your appointment time.     Estimated time to finish at 09:30 a.m. on Friday 08/23/2023.   If the display on your pump reads "Low Volume" and it is beeping, take the batteries out of the pump and come to the cancer center for it to be taken off.   If the pump alarms go off prior to the pump reading "Low Volume" then call [phone] and someone can assist you.  If the plunger comes out and  the chemotherapy medication is leaking out, please use your home chemo spill kit to clean up the spill. Do NOT use paper towels or other household products.  If you have problems or questions regarding your pump, please call either [phone] (24 hours a day) or the cancer center Monday-Friday 8:00 a.m.- 4:30 p.m. at the clinic number and we will assist you. If you are unable to get assistance, then go to the nearest Emergency Department and ask the staff to contact the IV team for assistance.

## 2023-08-22 ENCOUNTER — Other Ambulatory Visit: Payer: Self-pay

## 2023-08-23 ENCOUNTER — Inpatient Hospital Stay: Payer: Medicare Other

## 2023-08-23 VITALS — BP 103/75 | HR 97 | Temp 97.6°F | Resp 18

## 2023-08-23 DIAGNOSIS — Z5112 Encounter for antineoplastic immunotherapy: Secondary | ICD-10-CM | POA: Diagnosis not present

## 2023-08-23 DIAGNOSIS — C187 Malignant neoplasm of sigmoid colon: Secondary | ICD-10-CM

## 2023-08-23 MED ORDER — HEPARIN SOD (PORK) LOCK FLUSH 100 UNIT/ML IV SOLN
500.0000 [IU] | Freq: Once | INTRAVENOUS | Status: AC | PRN
  Administered 2023-08-23: 500 [IU]

## 2023-08-23 MED ORDER — SODIUM CHLORIDE 0.9% FLUSH
10.0000 mL | INTRAVENOUS | Status: DC | PRN
  Administered 2023-08-23: 10 mL

## 2023-08-23 NOTE — Progress Notes (Signed)
During his pump stop visit today, patient reported that he noted some blood stains after his bowel movement when he wiped. He insisted there was no blood noted in the stool. Patient shared that it was a forceful bowel movement because he had been constipated. Patient was advised that the blood stain may have been from a bruise to his anus due to the forceful bowel movement. He was encouraged to immediately call the clinic if this gets worse or if blood is noted in his stool. He was agreeable and verbalized understanding.

## 2023-08-23 NOTE — Patient Instructions (Signed)

## 2023-08-31 ENCOUNTER — Other Ambulatory Visit: Payer: Self-pay | Admitting: Oncology

## 2023-08-31 DIAGNOSIS — C187 Malignant neoplasm of sigmoid colon: Secondary | ICD-10-CM

## 2023-09-01 ENCOUNTER — Other Ambulatory Visit: Payer: Self-pay | Admitting: Oncology

## 2023-09-01 DIAGNOSIS — C187 Malignant neoplasm of sigmoid colon: Secondary | ICD-10-CM

## 2023-09-04 ENCOUNTER — Encounter: Payer: Self-pay | Admitting: Nurse Practitioner

## 2023-09-04 ENCOUNTER — Inpatient Hospital Stay (HOSPITAL_BASED_OUTPATIENT_CLINIC_OR_DEPARTMENT_OTHER): Payer: Medicare Other | Admitting: Nurse Practitioner

## 2023-09-04 ENCOUNTER — Inpatient Hospital Stay: Payer: Medicare Other

## 2023-09-04 ENCOUNTER — Inpatient Hospital Stay: Payer: Medicare Other | Attending: Oncology

## 2023-09-04 VITALS — BP 98/70 | HR 100 | Temp 98.1°F | Resp 18 | Ht 66.0 in | Wt 210.0 lb

## 2023-09-04 VITALS — BP 98/72 | HR 85 | Resp 20

## 2023-09-04 DIAGNOSIS — Z5111 Encounter for antineoplastic chemotherapy: Secondary | ICD-10-CM | POA: Diagnosis present

## 2023-09-04 DIAGNOSIS — C187 Malignant neoplasm of sigmoid colon: Secondary | ICD-10-CM | POA: Insufficient documentation

## 2023-09-04 DIAGNOSIS — Z5112 Encounter for antineoplastic immunotherapy: Secondary | ICD-10-CM | POA: Diagnosis present

## 2023-09-04 DIAGNOSIS — C787 Secondary malignant neoplasm of liver and intrahepatic bile duct: Secondary | ICD-10-CM | POA: Insufficient documentation

## 2023-09-04 LAB — CBC WITH DIFFERENTIAL (CANCER CENTER ONLY)
Abs Immature Granulocytes: 0.02 10*3/uL (ref 0.00–0.07)
Basophils Absolute: 0.1 10*3/uL (ref 0.0–0.1)
Basophils Relative: 1 %
Eosinophils Absolute: 0.2 10*3/uL (ref 0.0–0.5)
Eosinophils Relative: 3 %
HCT: 44.9 % (ref 39.0–52.0)
Hemoglobin: 14.8 g/dL (ref 13.0–17.0)
Immature Granulocytes: 0 %
Lymphocytes Relative: 28 %
Lymphs Abs: 1.9 10*3/uL (ref 0.7–4.0)
MCH: 32.7 pg (ref 26.0–34.0)
MCHC: 33 g/dL (ref 30.0–36.0)
MCV: 99.3 fL (ref 80.0–100.0)
Monocytes Absolute: 0.6 10*3/uL (ref 0.1–1.0)
Monocytes Relative: 9 %
Neutro Abs: 4 10*3/uL (ref 1.7–7.7)
Neutrophils Relative %: 59 %
Platelet Count: 131 10*3/uL — ABNORMAL LOW (ref 150–400)
RBC: 4.52 MIL/uL (ref 4.22–5.81)
RDW: 14.9 % (ref 11.5–15.5)
WBC Count: 6.7 10*3/uL (ref 4.0–10.5)
nRBC: 0 % (ref 0.0–0.2)

## 2023-09-04 LAB — CMP (CANCER CENTER ONLY)
ALT: 31 U/L (ref 0–44)
AST: 32 U/L (ref 15–41)
Albumin: 3.5 g/dL (ref 3.5–5.0)
Alkaline Phosphatase: 119 U/L (ref 38–126)
Anion gap: 8 (ref 5–15)
BUN: 12 mg/dL (ref 6–20)
CO2: 28 mmol/L (ref 22–32)
Calcium: 8.7 mg/dL — ABNORMAL LOW (ref 8.9–10.3)
Chloride: 104 mmol/L (ref 98–111)
Creatinine: 0.67 mg/dL (ref 0.61–1.24)
GFR, Estimated: >60 mL/min (ref >60)
Glucose, Bld: 148 mg/dL — ABNORMAL HIGH (ref 70–99)
Potassium: 3.6 mmol/L (ref 3.5–5.1)
Sodium: 140 mmol/L (ref 135–145)
Total Bilirubin: 0.6 mg/dL (ref 0.0–1.2)
Total Protein: 6.7 g/dL (ref 6.5–8.1)

## 2023-09-04 LAB — CEA (ACCESS): CEA (CHCC): 3.06 ng/mL (ref 0.00–5.00)

## 2023-09-04 LAB — MAGNESIUM: Magnesium: 1.8 mg/dL (ref 1.7–2.4)

## 2023-09-04 MED ORDER — FLUOROURACIL CHEMO INJECTION 2.5 GM/50ML
400.0000 mg/m2 | Freq: Once | INTRAVENOUS | Status: AC
  Administered 2023-09-04: 850 mg via INTRAVENOUS
  Filled 2023-09-04: qty 17

## 2023-09-04 MED ORDER — SODIUM CHLORIDE 0.9 % IV SOLN: Freq: Once | INTRAVENOUS | Status: AC

## 2023-09-04 MED ORDER — SODIUM CHLORIDE 0.9 % IV SOLN
2400.0000 mg/m2 | INTRAVENOUS | Status: DC
  Administered 2023-09-04: 5000 mg via INTRAVENOUS
  Filled 2023-09-04: qty 100

## 2023-09-04 MED ORDER — SODIUM CHLORIDE 0.9 % IV SOLN
400.0000 mg/m2 | Freq: Once | INTRAVENOUS | Status: AC
  Administered 2023-09-04: 836 mg via INTRAVENOUS
  Filled 2023-09-04: qty 41.8

## 2023-09-04 MED ORDER — SODIUM CHLORIDE 0.9 % IV SOLN
6.0000 mg/kg | Freq: Once | INTRAVENOUS | Status: AC
  Administered 2023-09-04: 600 mg via INTRAVENOUS
  Filled 2023-09-04: qty 20

## 2023-09-04 NOTE — Progress Notes (Signed)
Patient seen by Lonna Cobb NP today  Vitals are within treatment parameters:Yes   Labs are within treatment parameters: Yes   Treatment plan has been signed: Yes   Per physician team, Patient is ready for treatment and there are NO modifications to the treatment plan.

## 2023-09-04 NOTE — Progress Notes (Signed)
[facility] OFFICE PROGRESS NOTE   Diagnosis: Colon cancer  INTERVAL HISTORY:   Daniel [name] returns as scheduled.  He completed another cycle of 5-FU/Panitumumab 08/21/2023.  He reports having "norovirus" following the most recent chemotherapy.  He initially had nausea/vomiting that resolved after about 24 hours and then developed diarrhea.  In total symptoms lasted 2 to 3 days and then resolved.  No mouth sores though he did note tenderness along the sides of the tongue.  Skin rash is stable.  Persistent soreness and drainage involving the right great toe nailbed.  Objective:  Vital signs in last 24 hours:  Blood pressure 98/70, pulse 100, temperature 98.1 F (36.7 C), temperature source Temporal, resp. rate 18, height 5\' 6"  (1.676 m), weight 210 lb (95.3 kg), SpO2 98%.    HEENT: No thrush or ulcers. Resp: Lungs clear bilaterally. Cardio: Regular rate and rhythm. GI: No hepatosplenomegaly.  Abdomen soft and nontender. Vascular: No leg edema. Skin: Acne type rash over the trunk.  Lateral edge of the right great toenail bed with purplish discoloration and a small amount of drainage. Port-A-Cath without erythema.   Lab Results:  Lab Results  Component Value Date   WBC 6.7 09/04/2023   HGB 14.8 09/04/2023   HCT 44.9 09/04/2023   MCV 99.3 09/04/2023   PLT 131 (L) 09/04/2023   NEUTROABS 4.0 09/04/2023    Imaging:  No results found.  Medications: I have reviewed the patient's current medications.  Assessment/Plan: Sigmoid colon mass, multiple liver lesions Ultrasound abdomen 01/08/2023-multifocal hyperechoic liver lesions, largest measuring 8.7 cm in the right hepatic lobe CT abdomen/pelvis 01/08/2023-multiple by lobar hypoattenuating hepatic lesions, masslike thickening of the sigmoid colon with abnormal spiculated pericolonic soft tissue.  IMA lymphadenopathy.  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 Colonoscopy 01/29/2023-malignant tumor in the sigmoid colon (invasive moderately differentiated adenocarcinoma), four 6-9 mm polyps in the rectum, ascending colon and cecum, one 14 mm polyp in the sigmoid colon (within the rectal polyp biopsies there are 2 similar-appearing fragments showing invasive tumor with associated adenomatous change which is felt to be carryover from the sigmoid mass biopsies, the rectal polyp biopsies also show multiple fragments of tubular adenoma without high-grade dysplasia) Cycle 1 FOLFOX 01/30/2023 Cycle 2 FOLFOX 02/12/2023 Cycle 3 FOLFOX plus Panitumumab 02/27/2023 Cycle 4 FOLFOX plus Panitumumab 03/13/2023 Cycle 5 FOLFOX plus panitumumab 03/27/2023 Cycle 6 FOLFOX plus Panitumumab 04/10/2023 CTs 04/19/2023-decrease size of hepatic metastases, no new lesions, decreased right upper quadrant and retroperitoneal lymph nodes, sigmoid colonic wall thickening no longer seen with a decrease in sigmoid mesenteric lymph nodes Cycle 7 FOLFOX plus panitumumab 04/24/2023, 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 Cycle 8 FOLFOX plus Panitumumab 05/08/2023 Cycle 9 FOLFOX plus Panitumumab 05/22/2023 Cycle 10 FOLFOX plus Panitumumab 06/05/2023 Cycle 11 FOLFOX plus panitumumab 06/19/2023 Cycle 12 FOLFOX plus Panitumumab 07/03/2023, oxaliplatin held due to thrombocytopenia CTs 07/11/2023: Decrease in hepatic metastases, no enlarged abdominal or pelvic nodes, no metastatic disease to the chest Cycle 13 of 5-FU/panitumumab 07/23/2023 Cycle 14 5-FU/Panitumumab 08/07/2023 Cycle 15 5-FU/panitumumab 08/21/2023 Cycle 16 5-FU/Panitumumab 09/04/2023 Diabetes Cardiomyopathy Hypertension Hypercholesterolemia Weight loss secondary to [facility], metastatic carcinoma? 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 Goga appears stable.  He continues 5-FU/Panitumumab every 2 weeks.  He is tolerating treatment well.  No clinical evidence of disease  progression.  Plan to continue the same.  CBC reviewed.  Counts adequate to proceed as above.  He will return for follow-up and treatment in 2 weeks.    Lonna Cobb ANP/GNP-BC   09/04/2023  8:32 AM

## 2023-09-04 NOTE — Patient Instructions (Signed)
[facility]   Discharge Instructions: Thank you for choosing [facility] to provide your oncology and hematology care.   If you have a lab appointment with the Cancer Center, please go directly to the Cancer Center and check in at the registration area.   Wear comfortable clothing and clothing appropriate for easy access to any Portacath or PICC line.   We strive to give you quality time with your provider. You may need to reschedule your appointment if you arrive late (15 or more minutes).  Arriving late affects you and other patients whose appointments are after yours.  Also, if you miss three or more appointments without notifying the office, you may be dismissed from the clinic at the provider's discretion.      For prescription refill requests, have your pharmacy contact our office and allow 72 hours for refills to be completed.    Today you received the following chemotherapy and/or immunotherapy agents Panitumumab (VECTIBIX), Leucovorin & Flourouracil (ADRUCIL).      To help prevent nausea and vomiting after your treatment, we encourage you to take your nausea medication as directed.  BELOW ARE SYMPTOMS THAT SHOULD BE REPORTED IMMEDIATELY: *FEVER GREATER THAN 100.4 F (38 C) OR HIGHER *CHILLS OR SWEATING *NAUSEA AND VOMITING THAT IS NOT CONTROLLED WITH YOUR NAUSEA MEDICATION *UNUSUAL SHORTNESS OF BREATH *UNUSUAL BRUISING OR BLEEDING *URINARY PROBLEMS (pain or burning when urinating, or frequent urination) *BOWEL PROBLEMS (unusual diarrhea, constipation, pain near the anus) TENDERNESS IN MOUTH AND THROAT WITH OR WITHOUT PRESENCE OF ULCERS (sore throat, sores in mouth, or a toothache) UNUSUAL RASH, SWELLING OR PAIN  UNUSUAL VAGINAL DISCHARGE OR ITCHING   Items with * indicate a potential emergency and should be followed up as soon as possible or go to the Emergency Department if any problems should  occur.  Please show the CHEMOTHERAPY ALERT CARD or IMMUNOTHERAPY ALERT CARD at check-in to the Emergency Department and triage nurse.  Should you have questions after your visit or need to cancel or reschedule your appointment, please contact [facility]  Dept: [phone]  and follow the prompts.  Office hours are 8:00 a.m. to 4:30 p.m. Monday - Friday. Please note that voicemails left after 4:00 p.m. may not be returned until the following business day.  We are closed weekends and major holidays. You have access to a nurse at all times for urgent questions. Please call the main number to the clinic Dept: [phone] and follow the prompts.   For any non-urgent questions, you may also contact your provider using MyChart. We now offer e-Visits for anyone 40 and older to request care online for non-urgent symptoms. For details visit [email or web address].   Also download the MyChart app! Go to the app store, search "MyChart", open the app, select [place], and log in with your MyChart username and password.  Panitumumab Injection What is this medication? PANITUMUMAB (pan i TOOM ue mab) treats colorectal cancer. It works by blocking a protein that causes cancer cells to grow and multiply. This helps to slow or stop the spread of cancer cells. It is a monoclonal antibody. This medicine may be used for other purposes; ask your health care provider or pharmacist if you have questions. COMMON BRAND NAME(S): Vectibix What should I tell my care team before I take this medication? They need to know if you have any of these  conditions: Eye disease Low levels of magnesium in the blood Lung disease An unusual or allergic reaction to panitumumab, other medications, foods, dyes, or preservatives Pregnant or trying to get pregnant Breast-feeding How should I use this medication? This medication is injected into a vein. It is given by your care  team in a hospital or clinic setting. Talk to your care team about the use of this medication in children. Special care may be needed. Overdosage: If you think you have taken too much of this medicine contact a poison control center or emergency room at once. NOTE: This medicine is only for you. Do not share this medicine with others. What if I miss a dose? Keep appointments for follow-up doses. It is important not to miss your dose. Call your care team if you are unable to keep an appointment. What may interact with this medication? Bevacizumab This list may not describe all possible interactions. Give your health care provider a list of all the medicines, herbs, non-prescription drugs, or dietary supplements you use. Also tell them if you smoke, drink alcohol, or use illegal drugs. Some items may interact with your medicine. What should I watch for while using this medication? Your condition will be monitored carefully while you are receiving this medication. This medication may make you feel generally unwell. This is not uncommon as chemotherapy can affect healthy cells as well as cancer cells. Report any side effects. Continue your course of treatment even though you feel ill unless your care team tells you to stop. This medication can make you more sensitive to the sun. Keep out of the sun while receiving this medication and for 2 months after stopping therapy. If you cannot avoid being in the sun, wear protective clothing and sunscreen. Do not use sun lamps, tanning beds, or tanning booths. Check with your care team if you have severe diarrhea, nausea, and vomiting or if you sweat a lot. The loss of too much body fluid may make it dangerous for you to take this medication. This medication may cause serious skin reactions. They can happen weeks to months after starting the medication. Contact your care team right away if you notice fevers or flu-like symptoms with a rash. The rash may be red or  purple and then turn into blisters or peeling of the skin. You may also notice a red rash with swelling of the face, lips, or lymph nodes in your neck or under your arms. Talk to your care team if you may be pregnant. Serious birth defects can occur if you take this medication during pregnancy and for 2 months after the last dose. Contraception is recommended while taking this medication and for 2 months after the last dose. Your care team can help you find the option that works for you. Do not breastfeed while taking this medication and for 2 months after the last dose. This medication may cause infertility. Talk to your care team if you are concerned about your fertility. What side effects may I notice from receiving this medication? Side effects that you should report to your care team as soon as possible: Allergic reactions--skin rash, itching, hives, swelling of the face, lips, tongue, or throat Dry cough, shortness of breath or trouble breathing Eye pain, redness, irritation, or discharge with blurry or decreased vision Infusion reactions--chest pain, shortness of breath or trouble breathing, feeling faint or lightheaded Low magnesium level--muscle pain or cramps, unusual weakness or fatigue, fast or irregular heartbeat, tremors Low potassium level--muscle pain  or cramps, unusual weakness or fatigue, fast or irregular heartbeat, constipation Redness, blistering, peeling, or loosening of the skin, including inside the mouth Skin reactions on sun-exposed areas Side effects that usually do not require medical attention (report to your care team if they continue or are bothersome): Change in nail shape, thickness, or color Diarrhea Dry skin Fatigue Nausea Vomiting This list may not describe all possible side effects. Call your doctor for medical advice about side effects. You may report side effects to FDA at 1-800-FDA-1088. Where should I keep my medication? This medication is given in a  hospital or clinic. It will not be stored at home. NOTE: This sheet is a summary. It may not cover all possible information. If you have questions about this medicine, talk to your doctor, pharmacist, or health care provider.  2024 Elsevier/Gold Standard (2021-11-22 00:00:00)  Leucovorin Injection What is this medication? LEUCOVORIN (loo koe VOR in) prevents side effects from certain medications, such as methotrexate. It works by increasing folate levels. This helps protect healthy cells in your body. It may also be used to treat anemia caused by low levels of folate. It can also be used with fluorouracil, a type of chemotherapy, to treat colorectal cancer. It works by increasing the effects of fluorouracil in the body. This medicine may be used for other purposes; ask your health care provider or pharmacist if you have questions. What should I tell my care team before I take this medication? They need to know if you have any of these conditions: Anemia from low levels of vitamin B12 in the blood An unusual or allergic reaction to leucovorin, folic acid, other medications, foods, dyes, or preservatives Pregnant or trying to get pregnant Breastfeeding How should I use this medication? This medication is injected into a vein or a muscle. It is given by your care team in a hospital or clinic setting. Talk to your care team about the use of this medication in children. Special care may be needed. Overdosage: If you think you have taken too much of this medicine contact a poison control center or emergency room at once. NOTE: This medicine is only for you. Do not share this medicine with others. What if I miss a dose? Keep appointments for follow-up doses. It is important not to miss your dose. Call your care team if you are unable to keep an appointment. What may interact with this medication? Capecitabine Fluorouracil Phenobarbital Phenytoin Primidone Trimethoprim;sulfamethoxazole This list  may not describe all possible interactions. Give your health care provider a list of all the medicines, herbs, non-prescription drugs, or dietary supplements you use. Also tell them if you smoke, drink alcohol, or use illegal drugs. Some items may interact with your medicine. What should I watch for while using this medication? Your condition will be monitored carefully while you are receiving this medication. This medication may increase the side effects of 5-fluorouracil. Tell your care team if you have diarrhea or mouth sores that do not get better or that get worse. What side effects may I notice from receiving this medication? Side effects that you should report to your care team as soon as possible: Allergic reactions--skin rash, itching, hives, swelling of the face, lips, tongue, or throat This list may not describe all possible side effects. Call your doctor for medical advice about side effects. You may report side effects to FDA at 1-800-FDA-1088. Where should I keep my medication? This medication is given in a hospital or clinic. It will not  be stored at home. NOTE: This sheet is a summary. It may not cover all possible information. If you have questions about this medicine, talk to your doctor, pharmacist, or health care provider.  2024 Elsevier/Gold Standard (2021-12-12 00:00:00)  Fluorouracil Injection What is this medication? FLUOROURACIL (flure oh YOOR a sil) treats some types of cancer. It works by slowing down the growth of cancer cells. This medicine may be used for other purposes; ask your health care provider or pharmacist if you have questions. COMMON BRAND NAME(S): Adrucil What should I tell my care team before I take this medication? They need to know if you have any of these conditions: Blood disorders Dihydropyrimidine dehydrogenase (DPD) deficiency Infection, such as chickenpox, cold sores, herpes Kidney disease Liver disease Poor nutrition Recent or ongoing  radiation therapy An unusual or allergic reaction to fluorouracil, other medications, foods, dyes, or preservatives If you or your partner are pregnant or trying to get pregnant Breast-feeding How should I use this medication? This medication is injected into a vein. It is administered by your care team in a hospital or clinic setting. Talk to your care team about the use of this medication in children. Special care may be needed. Overdosage: If you think you have taken too much of this medicine contact a poison control center or emergency room at once. NOTE: This medicine is only for you. Do not share this medicine with others. What if I miss a dose? Keep appointments for follow-up doses. It is important not to miss your dose. Call your care team if you are unable to keep an appointment. What may interact with this medication? Do not take this medication with any of the following: Live virus vaccines This medication may also interact with the following: Medications that treat or prevent blood clots, such as warfarin, enoxaparin, dalteparin This list may not describe all possible interactions. Give your health care provider a list of all the medicines, herbs, non-prescription drugs, or dietary supplements you use. Also tell them if you smoke, drink alcohol, or use illegal drugs. Some items may interact with your medicine. What should I watch for while using this medication? Your condition will be monitored carefully while you are receiving this medication. This medication may make you feel generally unwell. This is not uncommon as chemotherapy can affect healthy cells as well as cancer cells. Report any side effects. Continue your course of treatment even though you feel ill unless your care team tells you to stop. In some cases, you may be given additional medications to help with side effects. Follow all directions for their use. This medication may increase your risk of getting an infection.  Call your care team for advice if you get a fever, chills, sore throat, or other symptoms of a cold or flu. Do not treat yourself. Try to avoid being around people who are sick. This medication may increase your risk to bruise or bleed. Call your care team if you notice any unusual bleeding. Be careful brushing or flossing your teeth or using a toothpick because you may get an infection or bleed more easily. If you have any dental work done, tell your dentist you are receiving this medication. Avoid taking medications that contain aspirin, acetaminophen, ibuprofen, naproxen, or ketoprofen unless instructed by your care team. These medications may hide a fever. Do not treat diarrhea with over the counter products. Contact your care team if you have diarrhea that lasts more than 2 days or if it is severe and  watery. This medication can make you more sensitive to the sun. Keep out of the sun. If you cannot avoid being in the sun, wear protective clothing and sunscreen. Do not use sun lamps, tanning beds, or tanning booths. Talk to your care team if you or your partner wish to become pregnant or think you might be pregnant. This medication can cause serious birth defects if taken during pregnancy and for 3 months after the last dose. A reliable form of contraception is recommended while taking this medication and for 3 months after the last dose. Talk to your care team about effective forms of contraception. Do not father a child while taking this medication and for 3 months after the last dose. Use a condom while having sex during this time period. Do not breastfeed while taking this medication. This medication may cause infertility. Talk to your care team if you are concerned about your fertility. What side effects may I notice from receiving this medication? Side effects that you should report to your care team as soon as possible: Allergic reactions--skin rash, itching, hives, swelling of the face, lips,  tongue, or throat Heart attack--pain or tightness in the chest, shoulders, arms, or jaw, nausea, shortness of breath, cold or clammy skin, feeling faint or lightheaded Heart failure--shortness of breath, swelling of the ankles, feet, or hands, sudden weight gain, unusual weakness or fatigue Heart rhythm changes--fast or irregular heartbeat, dizziness, feeling faint or lightheaded, chest pain, trouble breathing High ammonia level--unusual weakness or fatigue, confusion, loss of appetite, nausea, vomiting, seizures Infection--fever, chills, cough, sore throat, wounds that don't heal, pain or trouble when passing urine, general feeling of discomfort or being unwell Low red blood cell level--unusual weakness or fatigue, dizziness, headache, trouble breathing Pain, tingling, or numbness in the hands or feet, muscle weakness, change in vision, confusion or trouble speaking, loss of balance or coordination, trouble walking, seizures Redness, swelling, and blistering of the skin over hands and feet Severe or prolonged diarrhea Unusual bruising or bleeding Side effects that usually do not require medical attention (report to your care team if they continue or are bothersome): Dry skin Headache Increased tears Nausea Pain, redness, or swelling with sores inside the mouth or throat Sensitivity to light Vomiting This list may not describe all possible side effects. Call your doctor for medical advice about side effects. You may report side effects to FDA at 1-800-FDA-1088. Where should I keep my medication? This medication is given in a hospital or clinic. It will not be stored at home. NOTE: This sheet is a summary. It may not cover all possible information. If you have questions about this medicine, talk to your doctor, pharmacist, or health care provider.  2024 Elsevier/Gold Standard (2021-11-14 00:00:00)  The chemotherapy medication bag should finish at 46 hours, 96 hours, or 7 days. For example,  if your pump is scheduled for 46 hours and it was put on at 4:00 p.m., it should finish at 2:00 p.m. the day it is scheduled to come off regardless of your appointment time.     Estimated time to finish at 9:30 a.m. on Friday 09/06/2023.   If the display on your pump reads "Low Volume" and it is beeping, take the batteries out of the pump and come to the cancer center for it to be taken off.   If the pump alarms go off prior to the pump reading "Low Volume" then call [phone] and someone can assist you.  If the plunger comes out and  the chemotherapy medication is leaking out, please use your home chemo spill kit to clean up the spill. Do NOT use paper towels or other household products.  If you have problems or questions regarding your pump, please call either [phone] (24 hours a day) or the cancer center Monday-Friday 8:00 a.m.- 4:30 p.m. at the clinic number and we will assist you. If you are unable to get assistance, then go to the nearest Emergency Department and ask the staff to contact the IV team for assistance.

## 2023-09-05 ENCOUNTER — Other Ambulatory Visit: Payer: Self-pay

## 2023-09-06 ENCOUNTER — Inpatient Hospital Stay: Payer: Medicare Other

## 2023-09-06 VITALS — BP 101/77 | HR 99 | Temp 98.2°F | Resp 18

## 2023-09-06 DIAGNOSIS — Z5112 Encounter for antineoplastic immunotherapy: Secondary | ICD-10-CM | POA: Diagnosis not present

## 2023-09-06 DIAGNOSIS — C187 Malignant neoplasm of sigmoid colon: Secondary | ICD-10-CM

## 2023-09-06 MED ORDER — HEPARIN SOD (PORK) LOCK FLUSH 100 UNIT/ML IV SOLN
500.0000 [IU] | Freq: Once | INTRAVENOUS | Status: AC | PRN
  Administered 2023-09-06: 500 [IU]

## 2023-09-06 MED ORDER — SODIUM CHLORIDE 0.9% FLUSH
10.0000 mL | INTRAVENOUS | Status: DC | PRN
Start: 2023-09-06 — End: 2023-09-06
  Administered 2023-09-06: 10 mL

## 2023-09-13 ENCOUNTER — Other Ambulatory Visit: Payer: Self-pay | Admitting: Oncology

## 2023-09-18 ENCOUNTER — Encounter: Payer: Self-pay | Admitting: Oncology

## 2023-09-18 ENCOUNTER — Inpatient Hospital Stay: Payer: Medicare Other

## 2023-09-18 ENCOUNTER — Inpatient Hospital Stay (HOSPITAL_BASED_OUTPATIENT_CLINIC_OR_DEPARTMENT_OTHER): Payer: Medicare Other | Admitting: Oncology

## 2023-09-18 VITALS — BP 97/74 | HR 100 | Temp 98.1°F | Resp 18 | Ht 66.0 in | Wt 210.5 lb

## 2023-09-18 VITALS — BP 102/71 | HR 95

## 2023-09-18 DIAGNOSIS — C187 Malignant neoplasm of sigmoid colon: Secondary | ICD-10-CM

## 2023-09-18 DIAGNOSIS — Z5112 Encounter for antineoplastic immunotherapy: Secondary | ICD-10-CM | POA: Diagnosis not present

## 2023-09-18 LAB — CBC WITH DIFFERENTIAL (CANCER CENTER ONLY)
Abs Immature Granulocytes: 0.02 10*3/uL (ref 0.00–0.07)
Basophils Absolute: 0.1 10*3/uL (ref 0.0–0.1)
Basophils Relative: 1 %
Eosinophils Absolute: 0.3 10*3/uL (ref 0.0–0.5)
Eosinophils Relative: 3 %
HCT: 47.6 % (ref 39.0–52.0)
Hemoglobin: 15.9 g/dL (ref 13.0–17.0)
Immature Granulocytes: 0 %
Lymphocytes Relative: 23 %
Lymphs Abs: 1.9 10*3/uL (ref 0.7–4.0)
MCH: 32.9 pg (ref 26.0–34.0)
MCHC: 33.4 g/dL (ref 30.0–36.0)
MCV: 98.6 fL (ref 80.0–100.0)
Monocytes Absolute: 0.7 10*3/uL (ref 0.1–1.0)
Monocytes Relative: 9 %
Neutro Abs: 5.4 10*3/uL (ref 1.7–7.7)
Neutrophils Relative %: 64 %
Platelet Count: 114 10*3/uL — ABNORMAL LOW (ref 150–400)
RBC: 4.83 MIL/uL (ref 4.22–5.81)
RDW: 14.4 % (ref 11.5–15.5)
WBC Count: 8.3 10*3/uL (ref 4.0–10.5)
nRBC: 0 % (ref 0.0–0.2)

## 2023-09-18 LAB — CMP (CANCER CENTER ONLY)
ALT: 29 U/L (ref 0–44)
AST: 30 U/L (ref 15–41)
Albumin: 4.3 g/dL (ref 3.5–5.0)
Alkaline Phosphatase: 114 U/L (ref 38–126)
Anion gap: 9 (ref 5–15)
BUN: 14 mg/dL (ref 6–20)
CO2: 28 mmol/L (ref 22–32)
Calcium: 9.1 mg/dL (ref 8.9–10.3)
Chloride: 103 mmol/L (ref 98–111)
Creatinine: 0.81 mg/dL (ref 0.61–1.24)
GFR, Estimated: >60 mL/min (ref >60)
Glucose, Bld: 129 mg/dL — ABNORMAL HIGH (ref 70–99)
Potassium: 3.9 mmol/L (ref 3.5–5.1)
Sodium: 140 mmol/L (ref 135–145)
Total Bilirubin: 0.7 mg/dL (ref 0.0–1.2)
Total Protein: 7.2 g/dL (ref 6.5–8.1)

## 2023-09-18 LAB — MAGNESIUM: Magnesium: 1.9 mg/dL (ref 1.7–2.4)

## 2023-09-18 LAB — CEA (ACCESS): CEA (CHCC): 3.41 ng/mL (ref 0.00–5.00)

## 2023-09-18 MED ORDER — LEUCOVORIN CALCIUM INJECTION 350 MG
400.0000 mg/m2 | Freq: Once | INTRAVENOUS | Status: AC
  Administered 2023-09-18: 836 mg via INTRAVENOUS
  Filled 2023-09-18: qty 41.8

## 2023-09-18 MED ORDER — FLUOROURACIL CHEMO INJECTION 2.5 GM/50ML
400.0000 mg/m2 | Freq: Once | INTRAVENOUS | Status: AC
  Administered 2023-09-18: 850 mg via INTRAVENOUS
  Filled 2023-09-18: qty 17

## 2023-09-18 MED ORDER — SODIUM CHLORIDE 0.9 % IV SOLN
2400.0000 mg/m2 | INTRAVENOUS | Status: DC
  Administered 2023-09-18: 5000 mg via INTRAVENOUS
  Filled 2023-09-18: qty 100

## 2023-09-18 MED ORDER — SODIUM CHLORIDE 0.9 % IV SOLN
6.0000 mg/kg | Freq: Once | INTRAVENOUS | Status: AC
  Administered 2023-09-18: 600 mg via INTRAVENOUS
  Filled 2023-09-18: qty 20

## 2023-09-18 MED ORDER — DEXTROSE 5 % IV SOLN
400.0000 mg/m2 | Freq: Once | INTRAVENOUS | Status: DC
  Filled 2023-09-18: qty 41.8

## 2023-09-18 MED ORDER — SODIUM CHLORIDE 0.9 % IV SOLN: Freq: Once | INTRAVENOUS | Status: AC

## 2023-09-18 NOTE — Patient Instructions (Addendum)
 [facility]  Discharge Instructions: The chemotherapy medication bag should finish at 46 hours, 96 hours, or 7 days. For example, if your pump is scheduled for 46 hours and it was put on at 4:00 p.m., it should finish at 2:00 p.m. the day it is scheduled to come off regardless of your appointment time.     Estimated time to finish at 9:45 FRIDAY, September 20, 2023.   If the display on your pump reads "Low Volume" and it is beeping, take the batteries out of the pump and come to the cancer center for it to be taken off.   If the pump alarms go off prior to the pump reading "Low Volume" then call [phone] and someone can assist you.  If the plunger comes out and the chemotherapy medication is leaking out, please use your home chemo spill kit to clean up the spill. Do NOT use paper towels or other household products.  If you have problems or questions regarding your pump, please call either [phone] (24 hours a day) or the cancer center Monday-Friday 8:00 a.m.- 4:30 p.m. at the clinic number and we will assist you. If you are unable to get assistance, then go to the nearest Emergency Department and ask the staff to contact the IV team for assistance.   Thank you for choosing [facility] to provide your oncology and hematology care.   If you have a lab appointment with the Cancer Center, please go directly to the Cancer Center and check in at the registration area.   Wear comfortable clothing and clothing appropriate for easy access to any Portacath or PICC line.   We strive to give you quality time with your provider. You may need to reschedule your appointment if you arrive late (15 or more minutes).  Arriving late affects you and other patients whose appointments are after yours.  Also, if you miss three or more appointments without notifying the office, you may be dismissed from the clinic at the provider's  discretion.      For prescription refill requests, have your pharmacy contact our office and allow 72 hours for refills to be completed.    Today you received the following chemotherapy and/or immunotherapy agents VECTIBIX/LEUCOVORIN/FLUOROURACIL      To help prevent nausea and vomiting after your treatment, we encourage you to take your nausea medication as directed.  BELOW ARE SYMPTOMS THAT SHOULD BE REPORTED IMMEDIATELY: *FEVER GREATER THAN 100.4 F (38 C) OR HIGHER *CHILLS OR SWEATING *NAUSEA AND VOMITING THAT IS NOT CONTROLLED WITH YOUR NAUSEA MEDICATION *UNUSUAL SHORTNESS OF BREATH *UNUSUAL BRUISING OR BLEEDING *URINARY PROBLEMS (pain or burning when urinating, or frequent urination) *BOWEL PROBLEMS (unusual diarrhea, constipation, pain near the anus) TENDERNESS IN MOUTH AND THROAT WITH OR WITHOUT PRESENCE OF ULCERS (sore throat, sores in mouth, or a toothache) UNUSUAL RASH, SWELLING OR PAIN  UNUSUAL VAGINAL DISCHARGE OR ITCHING   Items with * indicate a potential emergency and should be followed up as soon as possible or go to the Emergency Department if any problems should occur.  Please show the CHEMOTHERAPY ALERT CARD or IMMUNOTHERAPY ALERT CARD at check-in to the Emergency Department and triage nurse.  Should you have questions after your visit or need to cancel or reschedule your appointment, please contact [facility]  Dept: [phone]  and follow the prompts.  Office hours are  8:00 a.m. to 4:30 p.m. Monday - Friday. Please note that voicemails left after 4:00 p.m. may not be returned until the following business day.  We are closed weekends and major holidays. You have access to a nurse at all times for urgent questions. Please call the main number to the clinic Dept: [phone] and follow the prompts.   For any non-urgent questions, you may also contact your provider using MyChart. We now offer e-Visits for anyone  79 and older to request care online for non-urgent symptoms. For details visit [email or web address].   Also download the MyChart app! Go to the app store, search "MyChart", open the app, select [place], and log in with your MyChart username and password.  Panitumumab Injection What is this medication? PANITUMUMAB (pan i TOOM ue mab) treats colorectal cancer. It works by blocking a protein that causes cancer cells to grow and multiply. This helps to slow or stop the spread of cancer cells. It is a monoclonal antibody. This medicine may be used for other purposes; ask your health care provider or pharmacist if you have questions. COMMON BRAND NAME(S): Vectibix What should I tell my care team before I take this medication? They need to know if you have any of these conditions: Eye disease Low levels of magnesium in the blood Lung disease An unusual or allergic reaction to panitumumab, other medications, foods, dyes, or preservatives Pregnant or trying to get pregnant Breast-feeding How should I use this medication? This medication is injected into a vein. It is given by your care team in a hospital or clinic setting. Talk to your care team about the use of this medication in children. Special care may be needed. Overdosage: If you think you have taken too much of this medicine contact a poison control center or emergency room at once. NOTE: This medicine is only for you. Do not share this medicine with others. What if I miss a dose? Keep appointments for follow-up doses. It is important not to miss your dose. Call your care team if you are unable to keep an appointment. What may interact with this medication? Bevacizumab This list may not describe all possible interactions. Give your health care provider a list of all the medicines, herbs, non-prescription drugs, or dietary supplements you use. Also tell them if you smoke, drink alcohol, or use illegal drugs. Some items may interact with  your medicine. What should I watch for while using this medication? Your condition will be monitored carefully while you are receiving this medication. This medication may make you feel generally unwell. This is not uncommon as chemotherapy can affect healthy cells as well as cancer cells. Report any side effects. Continue your course of treatment even though you feel ill unless your care team tells you to stop. This medication can make you more sensitive to the sun. Keep out of the sun while receiving this medication and for 2 months after stopping therapy. If you cannot avoid being in the sun, wear protective clothing and sunscreen. Do not use sun lamps, tanning beds, or tanning booths. Check with your care team if you have severe diarrhea, nausea, and vomiting or if you sweat a lot. The loss of too much body fluid may make it dangerous for you to take this medication. This medication may cause serious skin reactions. They can happen weeks to months after starting the medication. Contact your care team right away if you notice fevers or flu-like symptoms with a rash. The rash may be red  or purple and then turn into blisters or peeling of the skin. You may also notice a red rash with swelling of the face, lips, or lymph nodes in your neck or under your arms. Talk to your care team if you may be pregnant. Serious birth defects can occur if you take this medication during pregnancy and for 2 months after the last dose. Contraception is recommended while taking this medication and for 2 months after the last dose. Your care team can help you find the option that works for you. Do not breastfeed while taking this medication and for 2 months after the last dose. This medication may cause infertility. Talk to your care team if you are concerned about your fertility. What side effects may I notice from receiving this medication? Side effects that you should report to your care team as soon as  possible: Allergic reactions--skin rash, itching, hives, swelling of the face, lips, tongue, or throat Dry cough, shortness of breath or trouble breathing Eye pain, redness, irritation, or discharge with blurry or decreased vision Infusion reactions--chest pain, shortness of breath or trouble breathing, feeling faint or lightheaded Low magnesium level--muscle pain or cramps, unusual weakness or fatigue, fast or irregular heartbeat, tremors Low potassium level--muscle pain or cramps, unusual weakness or fatigue, fast or irregular heartbeat, constipation Redness, blistering, peeling, or loosening of the skin, including inside the mouth Skin reactions on sun-exposed areas Side effects that usually do not require medical attention (report to your care team if they continue or are bothersome): Change in nail shape, thickness, or color Diarrhea Dry skin Fatigue Nausea Vomiting This list may not describe all possible side effects. Call your doctor for medical advice about side effects. You may report side effects to FDA at 1-800-FDA-1088. Where should I keep my medication? This medication is given in a hospital or clinic. It will not be stored at home. NOTE: This sheet is a summary. It may not cover all possible information. If you have questions about this medicine, talk to your doctor, pharmacist, or health care provider.  2024 Elsevier/Gold Standard (2021-11-22 00:00:00)  Leucovorin Injection What is this medication? LEUCOVORIN (loo koe VOR in) prevents side effects from certain medications, such as methotrexate. It works by increasing folate levels. This helps protect healthy cells in your body. It may also be used to treat anemia caused by low levels of folate. It can also be used with fluorouracil, a type of chemotherapy, to treat colorectal cancer. It works by increasing the effects of fluorouracil in the body. This medicine may be used for other purposes; ask your health care provider or  pharmacist if you have questions. What should I tell my care team before I take this medication? They need to know if you have any of these conditions: Anemia from low levels of vitamin B12 in the blood An unusual or allergic reaction to leucovorin, folic acid, other medications, foods, dyes, or preservatives Pregnant or trying to get pregnant Breastfeeding How should I use this medication? This medication is injected into a vein or a muscle. It is given by your care team in a hospital or clinic setting. Talk to your care team about the use of this medication in children. Special care may be needed. Overdosage: If you think you have taken too much of this medicine contact a poison control center or emergency room at once. NOTE: This medicine is only for you. Do not share this medicine with others. What if I miss a dose? Keep appointments  for follow-up doses. It is important not to miss your dose. Call your care team if you are unable to keep an appointment. What may interact with this medication? Capecitabine Fluorouracil Phenobarbital Phenytoin Primidone Trimethoprim;sulfamethoxazole This list may not describe all possible interactions. Give your health care provider a list of all the medicines, herbs, non-prescription drugs, or dietary supplements you use. Also tell them if you smoke, drink alcohol, or use illegal drugs. Some items may interact with your medicine. What should I watch for while using this medication? Your condition will be monitored carefully while you are receiving this medication. This medication may increase the side effects of 5-fluorouracil. Tell your care team if you have diarrhea or mouth sores that do not get better or that get worse. What side effects may I notice from receiving this medication? Side effects that you should report to your care team as soon as possible: Allergic reactions--skin rash, itching, hives, swelling of the face, lips, tongue, or  throat This list may not describe all possible side effects. Call your doctor for medical advice about side effects. You may report side effects to FDA at 1-800-FDA-1088. Where should I keep my medication? This medication is given in a hospital or clinic. It will not be stored at home. NOTE: This sheet is a summary. It may not cover all possible information. If you have questions about this medicine, talk to your doctor, pharmacist, or health care provider.  2024 Elsevier/Gold Standard (2021-12-12 00:00:00)  Fluorouracil Injection What is this medication? FLUOROURACIL (flure oh YOOR a sil) treats some types of cancer. It works by slowing down the growth of cancer cells. This medicine may be used for other purposes; ask your health care provider or pharmacist if you have questions. COMMON BRAND NAME(S): Adrucil What should I tell my care team before I take this medication? They need to know if you have any of these conditions: Blood disorders Dihydropyrimidine dehydrogenase (DPD) deficiency Infection, such as chickenpox, cold sores, herpes Kidney disease Liver disease Poor nutrition Recent or ongoing radiation therapy An unusual or allergic reaction to fluorouracil, other medications, foods, dyes, or preservatives If you or your partner are pregnant or trying to get pregnant Breast-feeding How should I use this medication? This medication is injected into a vein. It is administered by your care team in a hospital or clinic setting. Talk to your care team about the use of this medication in children. Special care may be needed. Overdosage: If you think you have taken too much of this medicine contact a poison control center or emergency room at once. NOTE: This medicine is only for you. Do not share this medicine with others. What if I miss a dose? Keep appointments for follow-up doses. It is important not to miss your dose. Call your care team if you are unable to keep an  appointment. What may interact with this medication? Do not take this medication with any of the following: Live virus vaccines This medication may also interact with the following: Medications that treat or prevent blood clots, such as warfarin, enoxaparin, dalteparin This list may not describe all possible interactions. Give your health care provider a list of all the medicines, herbs, non-prescription drugs, or dietary supplements you use. Also tell them if you smoke, drink alcohol, or use illegal drugs. Some items may interact with your medicine. What should I watch for while using this medication? Your condition will be monitored carefully while you are receiving this medication. This medication may make you  feel generally unwell. This is not uncommon as chemotherapy can affect healthy cells as well as cancer cells. Report any side effects. Continue your course of treatment even though you feel ill unless your care team tells you to stop. In some cases, you may be given additional medications to help with side effects. Follow all directions for their use. This medication may increase your risk of getting an infection. Call your care team for advice if you get a fever, chills, sore throat, or other symptoms of a cold or flu. Do not treat yourself. Try to avoid being around people who are sick. This medication may increase your risk to bruise or bleed. Call your care team if you notice any unusual bleeding. Be careful brushing or flossing your teeth or using a toothpick because you may get an infection or bleed more easily. If you have any dental work done, tell your dentist you are receiving this medication. Avoid taking medications that contain aspirin, acetaminophen, ibuprofen, naproxen, or ketoprofen unless instructed by your care team. These medications may hide a fever. Do not treat diarrhea with over the counter products. Contact your care team if you have diarrhea that lasts more than 2  days or if it is severe and watery. This medication can make you more sensitive to the sun. Keep out of the sun. If you cannot avoid being in the sun, wear protective clothing and sunscreen. Do not use sun lamps, tanning beds, or tanning booths. Talk to your care team if you or your partner wish to become pregnant or think you might be pregnant. This medication can cause serious birth defects if taken during pregnancy and for 3 months after the last dose. A reliable form of contraception is recommended while taking this medication and for 3 months after the last dose. Talk to your care team about effective forms of contraception. Do not father a child while taking this medication and for 3 months after the last dose. Use a condom while having sex during this time period. Do not breastfeed while taking this medication. This medication may cause infertility. Talk to your care team if you are concerned about your fertility. What side effects may I notice from receiving this medication? Side effects that you should report to your care team as soon as possible: Allergic reactions--skin rash, itching, hives, swelling of the face, lips, tongue, or throat Heart attack--pain or tightness in the chest, shoulders, arms, or jaw, nausea, shortness of breath, cold or clammy skin, feeling faint or lightheaded Heart failure--shortness of breath, swelling of the ankles, feet, or hands, sudden weight gain, unusual weakness or fatigue Heart rhythm changes--fast or irregular heartbeat, dizziness, feeling faint or lightheaded, chest pain, trouble breathing High ammonia level--unusual weakness or fatigue, confusion, loss of appetite, nausea, vomiting, seizures Infection--fever, chills, cough, sore throat, wounds that don't heal, pain or trouble when passing urine, general feeling of discomfort or being unwell Low red blood cell level--unusual weakness or fatigue, dizziness, headache, trouble breathing Pain, tingling, or  numbness in the hands or feet, muscle weakness, change in vision, confusion or trouble speaking, loss of balance or coordination, trouble walking, seizures Redness, swelling, and blistering of the skin over hands and feet Severe or prolonged diarrhea Unusual bruising or bleeding Side effects that usually do not require medical attention (report to your care team if they continue or are bothersome): Dry skin Headache Increased tears Nausea Pain, redness, or swelling with sores inside the mouth or throat Sensitivity to light Vomiting This  list may not describe all possible side effects. Call your doctor for medical advice about side effects. You may report side effects to FDA at 1-800-FDA-1088. Where should I keep my medication? This medication is given in a hospital or clinic. It will not be stored at home. NOTE: This sheet is a summary. It may not cover all possible information. If you have questions about this medicine, talk to your doctor, pharmacist, or health care provider.  2024 Elsevier/Gold Standard (2021-11-14 00:00:00)

## 2023-09-18 NOTE — Progress Notes (Signed)
 [facility] OFFICE PROGRESS NOTE   Diagnosis: Colon cancer  INTERVAL HISTORY:   Daniel. Skellenger returns as scheduled.  Completed a cycle of 5-FU/panitumumab on 09/04/2023.  No new complaint.  The skin rash has improved.  He continues to have numbness in the fingers.  He feels well.  Objective:  Vital signs in last 24 hours:  Blood pressure 97/74, pulse 100, temperature 98.1 F (36.7 C), temperature source Temporal, resp. rate 18, height 5\' 6"  (1.676 m), weight 210 lb 8 oz (95.5 kg), SpO2 100%.    HEENT: No thrush, 1 petechiae at the posterior buccal mucosa bilaterally Resp: Lungs clear bilaterally Cardio: Regular rate and rhythm, distant heart sounds GI: Nontender, no hepatosplenomegaly Vascular: No leg edema  Skin: Acne type rash over the face and trunk, area of healing linear ulceration at the dorsum of a left finger  Portacath/PICC-without erythema  Lab Results:  Lab Results  Component Value Date   WBC 8.3 09/18/2023   HGB 15.9 09/18/2023   HCT 47.6 09/18/2023   MCV 98.6 09/18/2023   PLT 114 (L) 09/18/2023   NEUTROABS 5.4 09/18/2023    CMP  Lab Results  Component Value Date   NA 140 09/18/2023   K 3.9 09/18/2023   CL 103 09/18/2023   CO2 28 09/18/2023   GLUCOSE 129 (H) 09/18/2023   BUN 14 09/18/2023   CREATININE 0.81 09/18/2023   CALCIUM 9.1 09/18/2023   PROT 7.2 09/18/2023   ALBUMIN 4.3 09/18/2023   AST 30 09/18/2023   ALT 29 09/18/2023   ALKPHOS 114 09/18/2023   BILITOT 0.7 09/18/2023   GFRNONAA >60 09/18/2023   GFRAA >60 07/23/2016    Lab Results  Component Value Date   CEA 3.06 09/04/2023     Medications: I have reviewed the patient's current medications.   Assessment/Plan: Sigmoid colon mass, multiple liver lesions Ultrasound abdomen 01/08/2023-multifocal hyperechoic liver lesions, largest measuring 8.7 cm in the right hepatic lobe CT abdomen/pelvis 01/08/2023-multiple by lobar hypoattenuating hepatic lesions, masslike thickening  of the sigmoid colon with abnormal spiculated pericolonic soft tissue.  IMA lymphadenopathy.  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 Colonoscopy 01/29/2023-malignant tumor in the sigmoid colon (invasive moderately differentiated adenocarcinoma), four 6-9 mm polyps in the rectum, ascending colon and cecum, one 14 mm polyp in the sigmoid colon (within the rectal polyp biopsies there are 2 similar-appearing fragments showing invasive tumor with associated adenomatous change which is felt to be carryover from the sigmoid mass biopsies, the rectal polyp biopsies also show multiple fragments of tubular adenoma without high-grade dysplasia) Cycle 1 FOLFOX 01/30/2023 Cycle 2 FOLFOX 02/12/2023 Cycle 3 FOLFOX plus Panitumumab 02/27/2023 Cycle 4 FOLFOX plus Panitumumab 03/13/2023 Cycle 5 FOLFOX plus panitumumab 03/27/2023 Cycle 6 FOLFOX plus Panitumumab 04/10/2023 CTs 04/19/2023-decrease size of hepatic metastases, no new lesions, decreased right upper quadrant and retroperitoneal lymph nodes, sigmoid colonic wall thickening no longer seen with a decrease in sigmoid mesenteric lymph nodes Cycle 7 FOLFOX plus panitumumab 04/24/2023, 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 Cycle 8 FOLFOX plus Panitumumab 05/08/2023 Cycle 9 FOLFOX plus Panitumumab 05/22/2023 Cycle 10 FOLFOX plus Panitumumab 06/05/2023 Cycle 11 FOLFOX plus panitumumab 06/19/2023 Cycle 12 FOLFOX plus Panitumumab 07/03/2023, oxaliplatin held due to thrombocytopenia CTs 07/11/2023: Decrease in hepatic metastases, no enlarged abdominal or pelvic nodes, no metastatic disease to the chest Cycle 13 of 5-FU/panitumumab  07/23/2023 Cycle 14 5-FU/Panitumumab 08/07/2023 Cycle 15  5-FU/panitumumab 08/21/2023 Cycle 16 5-FU/Panitumumab 09/04/2023 Cycle seventeen 5-FU/panitumumab 09/18/2023 Diabetes Cardiomyopathy Hypertension Hypercholesterolemia Weight loss secondary to [facility], metastatic carcinoma? 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  He continues to tolerate the 5-FU/panitumumab well.  He will complete another cycle today.  He will return for an office visit and chemotherapy in 2 weeks.  We will plan for a restaging CT evaluation in late March or early April.  Thornton Papas, MD  09/18/2023  8:45 AM

## 2023-09-18 NOTE — Progress Notes (Signed)
 Patient seen by Dr. Thornton Papas today  Vitals are within treatment parameters:Yes   Labs are within treatment parameters: Yes   Treatment plan has been signed: Yes   Per physician team, Patient is ready for treatment and there are NO modifications to the treatment plan.

## 2023-09-19 ENCOUNTER — Other Ambulatory Visit: Payer: Self-pay

## 2023-09-20 ENCOUNTER — Inpatient Hospital Stay: Payer: Medicare Other

## 2023-09-20 VITALS — BP 94/70 | HR 96 | Temp 97.9°F | Resp 18

## 2023-09-20 DIAGNOSIS — C187 Malignant neoplasm of sigmoid colon: Secondary | ICD-10-CM

## 2023-09-20 DIAGNOSIS — Z5112 Encounter for antineoplastic immunotherapy: Secondary | ICD-10-CM | POA: Diagnosis not present

## 2023-09-20 MED ORDER — SODIUM CHLORIDE 0.9% FLUSH
10.0000 mL | INTRAVENOUS | Status: DC | PRN
  Administered 2023-09-20: 10 mL

## 2023-09-20 MED ORDER — HEPARIN SOD (PORK) LOCK FLUSH 100 UNIT/ML IV SOLN
500.0000 [IU] | Freq: Once | INTRAVENOUS | Status: AC | PRN
  Administered 2023-09-20: 500 [IU]

## 2023-09-20 NOTE — Patient Instructions (Signed)

## 2023-09-25 ENCOUNTER — Other Ambulatory Visit: Payer: Self-pay | Admitting: Oncology

## 2023-09-25 DIAGNOSIS — C187 Malignant neoplasm of sigmoid colon: Secondary | ICD-10-CM

## 2023-10-02 ENCOUNTER — Inpatient Hospital Stay: Payer: Medicare Other | Attending: Oncology

## 2023-10-02 ENCOUNTER — Inpatient Hospital Stay: Payer: Medicare Other

## 2023-10-02 ENCOUNTER — Other Ambulatory Visit: Payer: Self-pay

## 2023-10-02 ENCOUNTER — Inpatient Hospital Stay (HOSPITAL_BASED_OUTPATIENT_CLINIC_OR_DEPARTMENT_OTHER): Payer: Medicare Other | Admitting: Nurse Practitioner

## 2023-10-02 ENCOUNTER — Encounter: Payer: Self-pay | Admitting: Nurse Practitioner

## 2023-10-02 VITALS — BP 96/70 | HR 100 | Temp 98.2°F | Resp 18 | Ht 66.0 in | Wt 211.5 lb

## 2023-10-02 DIAGNOSIS — C187 Malignant neoplasm of sigmoid colon: Secondary | ICD-10-CM

## 2023-10-02 DIAGNOSIS — D509 Iron deficiency anemia, unspecified: Secondary | ICD-10-CM | POA: Insufficient documentation

## 2023-10-02 DIAGNOSIS — Z5112 Encounter for antineoplastic immunotherapy: Secondary | ICD-10-CM | POA: Diagnosis present

## 2023-10-02 DIAGNOSIS — C787 Secondary malignant neoplasm of liver and intrahepatic bile duct: Secondary | ICD-10-CM | POA: Insufficient documentation

## 2023-10-02 DIAGNOSIS — Z5111 Encounter for antineoplastic chemotherapy: Secondary | ICD-10-CM | POA: Diagnosis present

## 2023-10-02 LAB — CMP (CANCER CENTER ONLY)
ALT: 26 U/L (ref 0–44)
AST: 26 U/L (ref 15–41)
Albumin: 4.3 g/dL (ref 3.5–5.0)
Alkaline Phosphatase: 113 U/L (ref 38–126)
Anion gap: 10 (ref 5–15)
BUN: 16 mg/dL (ref 6–20)
CO2: 28 mmol/L (ref 22–32)
Calcium: 8.8 mg/dL — ABNORMAL LOW (ref 8.9–10.3)
Chloride: 102 mmol/L (ref 98–111)
Creatinine: 0.89 mg/dL (ref 0.61–1.24)
GFR, Estimated: >60 mL/min (ref >60)
Glucose, Bld: 114 mg/dL — ABNORMAL HIGH (ref 70–99)
Potassium: 3.5 mmol/L (ref 3.5–5.1)
Sodium: 140 mmol/L (ref 135–145)
Total Bilirubin: 0.6 mg/dL (ref 0.0–1.2)
Total Protein: 7.1 g/dL (ref 6.5–8.1)

## 2023-10-02 LAB — CBC WITH DIFFERENTIAL (CANCER CENTER ONLY)
Abs Immature Granulocytes: 0.02 10*3/uL (ref 0.00–0.07)
Basophils Absolute: 0.1 10*3/uL (ref 0.0–0.1)
Basophils Relative: 1 %
Eosinophils Absolute: 0.3 10*3/uL (ref 0.0–0.5)
Eosinophils Relative: 3 %
HCT: 46.7 % (ref 39.0–52.0)
Hemoglobin: 15.7 g/dL (ref 13.0–17.0)
Immature Granulocytes: 0 %
Lymphocytes Relative: 19 %
Lymphs Abs: 1.9 10*3/uL (ref 0.7–4.0)
MCH: 32.7 pg (ref 26.0–34.0)
MCHC: 33.6 g/dL (ref 30.0–36.0)
MCV: 97.3 fL (ref 80.0–100.0)
Monocytes Absolute: 0.9 10*3/uL (ref 0.1–1.0)
Monocytes Relative: 8 %
Neutro Abs: 7.2 10*3/uL (ref 1.7–7.7)
Neutrophils Relative %: 69 %
Platelet Count: 128 10*3/uL — ABNORMAL LOW (ref 150–400)
RBC: 4.8 MIL/uL (ref 4.22–5.81)
RDW: 14.4 % (ref 11.5–15.5)
WBC Count: 10.4 10*3/uL (ref 4.0–10.5)
nRBC: 0 % (ref 0.0–0.2)

## 2023-10-02 LAB — MAGNESIUM: Magnesium: 1.5 mg/dL — ABNORMAL LOW (ref 1.7–2.4)

## 2023-10-02 LAB — CEA (ACCESS): CEA (CHCC): 3.3 ng/mL (ref 0.00–5.00)

## 2023-10-02 MED ORDER — SODIUM CHLORIDE 0.9 % IV SOLN
6.0000 mg/kg | Freq: Once | INTRAVENOUS | Status: AC
  Administered 2023-10-02: 600 mg via INTRAVENOUS
  Filled 2023-10-02: qty 20

## 2023-10-02 MED ORDER — FLUOROURACIL CHEMO INJECTION 2.5 GM/50ML
400.0000 mg/m2 | Freq: Once | INTRAVENOUS | Status: AC
  Administered 2023-10-02: 850 mg via INTRAVENOUS
  Filled 2023-10-02: qty 17

## 2023-10-02 MED ORDER — MAGNESIUM SULFATE 2 GM/50ML IV SOLN
2.0000 g | Freq: Once | INTRAVENOUS | Status: AC
  Administered 2023-10-02: 2 g via INTRAVENOUS
  Filled 2023-10-02: qty 50

## 2023-10-02 MED ORDER — SODIUM CHLORIDE 0.9 % IV SOLN: Freq: Once | INTRAVENOUS | Status: AC

## 2023-10-02 MED ORDER — DEXTROSE 5 % IV SOLN
400.0000 mg/m2 | Freq: Once | INTRAVENOUS | Status: AC
  Administered 2023-10-02: 836 mg via INTRAVENOUS
  Filled 2023-10-02: qty 41.8

## 2023-10-02 MED ORDER — SODIUM CHLORIDE 0.9% FLUSH
10.0000 mL | INTRAVENOUS | Status: DC | PRN
Start: 2023-10-02 — End: 2023-10-02

## 2023-10-02 MED ORDER — SODIUM CHLORIDE 0.9 % IV SOLN
2400.0000 mg/m2 | INTRAVENOUS | Status: DC
  Administered 2023-10-02: 5000 mg via INTRAVENOUS
  Filled 2023-10-02: qty 100

## 2023-10-02 NOTE — Patient Instructions (Signed)

## 2023-10-02 NOTE — Progress Notes (Signed)
 [facility] OFFICE PROGRESS NOTE   Diagnosis: Colon cancer  INTERVAL HISTORY:   Mr. Bur returns as scheduled.  He completed another cycle of 5-FU/Panitumumab 09/18/2023.  He denies nausea/vomiting.  He thinks he had a single mouth sore.  No diarrhea except related to dietary intake.  Rash varies.  No bleeding.  Objective:  Vital signs in last 24 hours:  Blood pressure 96/70, pulse 100, temperature 98.2 F (36.8 C), temperature source Temporal, resp. rate 18, height 5\' 6"  (1.676 m), weight 211 lb 8 oz (95.9 kg), SpO2 100%.    HEENT: No thrush.  Small ecchymosis left buccal mucosa. Resp: Lungs clear bilaterally. Cardio: Regular rate and rhythm, distant heart sounds. GI: Abdomen soft and nontender.  No hepatosplenomegaly. Vascular: No leg edema. Skin: Acne type rash scattered over the face and trunk.  No significant paronychia. Port-A-Cath without erythema.  Lab Results:  Lab Results  Component Value Date   WBC 10.4 10/02/2023   HGB 15.7 10/02/2023   HCT 46.7 10/02/2023   MCV 97.3 10/02/2023   PLT 128 (L) 10/02/2023   NEUTROABS 7.2 10/02/2023    Imaging:  No results found.  Medications: I have reviewed the patient's current medications.  Assessment/Plan: Sigmoid colon mass, multiple liver lesions Ultrasound abdomen 01/08/2023-multifocal hyperechoic liver lesions, largest measuring 8.7 cm in the right hepatic lobe CT abdomen/pelvis 01/08/2023-multiple by lobar hypoattenuating hepatic lesions, masslike thickening of the sigmoid colon with abnormal spiculated pericolonic soft tissue.  IMA lymphadenopathy.  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 Colonoscopy 01/29/2023-malignant tumor in the sigmoid colon (invasive  moderately differentiated adenocarcinoma), four 6-9 mm polyps in the rectum, ascending colon and cecum, one 14 mm polyp in the sigmoid colon (within the rectal polyp biopsies there are 2 similar-appearing fragments showing invasive tumor with associated adenomatous change which is felt to be carryover from the sigmoid mass biopsies, the rectal polyp biopsies also show multiple fragments of tubular adenoma without high-grade dysplasia) Cycle 1 FOLFOX 01/30/2023 Cycle 2 FOLFOX 02/12/2023 Cycle 3 FOLFOX plus Panitumumab 02/27/2023 Cycle 4 FOLFOX plus Panitumumab 03/13/2023 Cycle 5 FOLFOX plus panitumumab 03/27/2023 Cycle 6 FOLFOX plus Panitumumab 04/10/2023 CTs 04/19/2023-decrease size of hepatic metastases, no new lesions, decreased right upper quadrant and retroperitoneal lymph nodes, sigmoid colonic wall thickening no longer seen with a decrease in sigmoid mesenteric lymph nodes Cycle 7 FOLFOX plus panitumumab 04/24/2023, 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 Cycle 8 FOLFOX plus Panitumumab 05/08/2023 Cycle 9 FOLFOX plus Panitumumab 05/22/2023 Cycle 10 FOLFOX plus Panitumumab 06/05/2023 Cycle 11 FOLFOX plus panitumumab 06/19/2023 Cycle 12 FOLFOX plus Panitumumab 07/03/2023, oxaliplatin held due to thrombocytopenia CTs 07/11/2023: Decrease in hepatic metastases, no enlarged abdominal or pelvic nodes, no metastatic disease to the chest Cycle 13 of 5-FU/panitumumab 07/23/2023 Cycle 14 5-FU/Panitumumab 08/07/2023 Cycle 15 5-FU/panitumumab 08/21/2023 Cycle 16 5-FU/Panitumumab 09/04/2023 Cycle 17 5-FU/panitumumab 09/18/2023 Cycle 18 5-FU/Panitumumab 10/02/2023 Diabetes Cardiomyopathy Hypertension Hypercholesterolemia Weight loss secondary to [facility], metastatic carcinoma? 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  He continues 5-FU/Panitumumab every 2 weeks.  He is tolerating treatment  well.  There is no clinical evidence of disease progression.  Most recent CEA stable in normal range.  Plan to proceed with 5-FU/Panitumumab today as scheduled.  CBC and chemistry panel reviewed.  Labs adequate to proceed as  above.  Magnesium is low.  He will receive IV magnesium today.  He will return for follow-up and treatment in 2 weeks.    Lonna Cobb ANP/GNP-BC   10/02/2023  8:25 AM

## 2023-10-02 NOTE — Progress Notes (Signed)
 Patient seen by Lonna Cobb NP today  Vitals are within treatment parameters:Yes   Labs are within treatment parameters: No (Please specify and give further instructions.)  Magnesium 1.5. Patient will need 2 grams IV mag today  Treatment plan has been signed: Yes   Per physician team, Patient is ready for treatment and there are NO modifications to the treatment plan.

## 2023-10-02 NOTE — Patient Instructions (Signed)
 [facility] A DEPT OF [facility]  Discharge Instructions: Thank you for choosing [facility] to provide your oncology and hematology care.   If you have a lab appointment with the Cancer Center, please go directly to the Cancer Center and check in at the registration area.   Wear comfortable clothing and clothing appropriate for easy access to any Portacath or PICC line.   We strive to give you quality time with your provider. You may need to reschedule your appointment if you arrive late (15 or more minutes).  Arriving late affects you and other patients whose appointments are after yours.  Also, if you miss three or more appointments without notifying the office, you may be dismissed from the clinic at the provider's discretion.      For prescription refill requests, have your pharmacy contact our office and allow 72 hours for refills to be completed.    Today you received the following chemotherapy and/or immunotherapy agents Vectibix, Leucovorin, Fluorouracil   To help prevent nausea and vomiting after your treatment, we encourage you to take your nausea medication as directed.  BELOW ARE SYMPTOMS THAT SHOULD BE REPORTED IMMEDIATELY: *FEVER GREATER THAN 100.4 F (38 C) OR HIGHER *CHILLS OR SWEATING *NAUSEA AND VOMITING THAT IS NOT CONTROLLED WITH YOUR NAUSEA MEDICATION *UNUSUAL SHORTNESS OF BREATH *UNUSUAL BRUISING OR BLEEDING *URINARY PROBLEMS (pain or burning when urinating, or frequent urination) *BOWEL PROBLEMS (unusual diarrhea, constipation, pain near the anus) TENDERNESS IN MOUTH AND THROAT WITH OR WITHOUT PRESENCE OF ULCERS (sore throat, sores in mouth, or a toothache) UNUSUAL RASH, SWELLING OR PAIN  UNUSUAL VAGINAL DISCHARGE OR ITCHING   Items with * indicate a potential emergency and should be followed up as soon as possible or go to the Emergency Department if any problems should occur.  Please show the CHEMOTHERAPY ALERT  CARD or IMMUNOTHERAPY ALERT CARD at check-in to the Emergency Department and triage nurse.  Should you have questions after your visit or need to cancel or reschedule your appointment, please contact [facility]  Dept: [phone]  and follow the prompts.  Office hours are 8:00 a.m. to 4:30 p.m. Monday - Friday. Please note that voicemails left after 4:00 p.m. may not be returned until the following business day.  We are closed weekends and major holidays. You have access to a nurse at all times for urgent questions. Please call the main number to the clinic Dept: [phone] and follow the prompts.   For any non-urgent questions, you may also contact your provider using MyChart. We now offer e-Visits for anyone 35 and older to request care online for non-urgent symptoms. For details visit [email or web address].   Also download the MyChart app! Go to the app store, search "MyChart", open the app, select [place], and log in with your MyChart username and password.

## 2023-10-03 ENCOUNTER — Other Ambulatory Visit: Payer: Self-pay

## 2023-10-04 ENCOUNTER — Inpatient Hospital Stay: Payer: Medicare Other

## 2023-10-04 VITALS — BP 94/68 | HR 86 | Temp 98.1°F | Resp 18

## 2023-10-04 DIAGNOSIS — Z95828 Presence of other vascular implants and grafts: Secondary | ICD-10-CM

## 2023-10-04 DIAGNOSIS — Z5112 Encounter for antineoplastic immunotherapy: Secondary | ICD-10-CM | POA: Diagnosis not present

## 2023-10-04 DIAGNOSIS — C187 Malignant neoplasm of sigmoid colon: Secondary | ICD-10-CM

## 2023-10-04 MED ORDER — HEPARIN SOD (PORK) LOCK FLUSH 100 UNIT/ML IV SOLN
500.0000 [IU] | Freq: Once | INTRAVENOUS | Status: AC
  Administered 2023-10-04: 500 [IU] via INTRAVENOUS

## 2023-10-04 MED ORDER — SODIUM CHLORIDE 0.9% FLUSH
10.0000 mL | INTRAVENOUS | Status: DC | PRN
  Administered 2023-10-04: 10 mL via INTRAVENOUS

## 2023-10-04 NOTE — Patient Instructions (Signed)

## 2023-10-06 ENCOUNTER — Other Ambulatory Visit: Payer: Self-pay | Admitting: Oncology

## 2023-10-06 DIAGNOSIS — C187 Malignant neoplasm of sigmoid colon: Secondary | ICD-10-CM

## 2023-10-07 ENCOUNTER — Encounter: Payer: Self-pay | Admitting: Oncology

## 2023-10-09 ENCOUNTER — Encounter (HOSPITAL_BASED_OUTPATIENT_CLINIC_OR_DEPARTMENT_OTHER): Payer: Self-pay

## 2023-10-13 ENCOUNTER — Other Ambulatory Visit: Payer: Self-pay | Admitting: Oncology

## 2023-10-16 ENCOUNTER — Encounter: Payer: Self-pay | Admitting: Nurse Practitioner

## 2023-10-16 ENCOUNTER — Inpatient Hospital Stay (HOSPITAL_BASED_OUTPATIENT_CLINIC_OR_DEPARTMENT_OTHER): Payer: Medicare Other | Admitting: Nurse Practitioner

## 2023-10-16 ENCOUNTER — Inpatient Hospital Stay: Payer: Medicare Other

## 2023-10-16 VITALS — BP 95/76 | HR 100 | Temp 98.2°F | Resp 18 | Ht 66.0 in | Wt 209.5 lb

## 2023-10-16 DIAGNOSIS — C187 Malignant neoplasm of sigmoid colon: Secondary | ICD-10-CM | POA: Diagnosis not present

## 2023-10-16 DIAGNOSIS — Z5112 Encounter for antineoplastic immunotherapy: Secondary | ICD-10-CM | POA: Diagnosis not present

## 2023-10-16 LAB — CBC WITH DIFFERENTIAL (CANCER CENTER ONLY)
Abs Immature Granulocytes: 0.03 10*3/uL (ref 0.00–0.07)
Basophils Absolute: 0.1 10*3/uL (ref 0.0–0.1)
Basophils Relative: 1 %
Eosinophils Absolute: 0.3 10*3/uL (ref 0.0–0.5)
Eosinophils Relative: 3 %
HCT: 47.8 % (ref 39.0–52.0)
Hemoglobin: 16.1 g/dL (ref 13.0–17.0)
Immature Granulocytes: 0 %
Lymphocytes Relative: 23 %
Lymphs Abs: 2.1 10*3/uL (ref 0.7–4.0)
MCH: 32.6 pg (ref 26.0–34.0)
MCHC: 33.7 g/dL (ref 30.0–36.0)
MCV: 96.8 fL (ref 80.0–100.0)
Monocytes Absolute: 0.9 10*3/uL (ref 0.1–1.0)
Monocytes Relative: 10 %
Neutro Abs: 5.9 10*3/uL (ref 1.7–7.7)
Neutrophils Relative %: 63 %
Platelet Count: 124 10*3/uL — ABNORMAL LOW (ref 150–400)
RBC: 4.94 MIL/uL (ref 4.22–5.81)
RDW: 14.3 % (ref 11.5–15.5)
WBC Count: 9.3 10*3/uL (ref 4.0–10.5)
nRBC: 0 % (ref 0.0–0.2)

## 2023-10-16 LAB — CMP (CANCER CENTER ONLY)
ALT: 26 U/L (ref 0–44)
AST: 28 U/L (ref 15–41)
Albumin: 4.2 g/dL (ref 3.5–5.0)
Alkaline Phosphatase: 94 U/L (ref 38–126)
Anion gap: 9 (ref 5–15)
BUN: 14 mg/dL (ref 6–20)
CO2: 29 mmol/L (ref 22–32)
Calcium: 8.8 mg/dL — ABNORMAL LOW (ref 8.9–10.3)
Chloride: 103 mmol/L (ref 98–111)
Creatinine: 0.77 mg/dL (ref 0.61–1.24)
GFR, Estimated: >60 mL/min (ref >60)
Glucose, Bld: 95 mg/dL (ref 70–99)
Potassium: 3.5 mmol/L (ref 3.5–5.1)
Sodium: 141 mmol/L (ref 135–145)
Total Bilirubin: 0.8 mg/dL (ref 0.0–1.2)
Total Protein: 7.2 g/dL (ref 6.5–8.1)

## 2023-10-16 LAB — CEA (ACCESS): CEA (CHCC): 3.3 ng/mL (ref 0.00–5.00)

## 2023-10-16 LAB — MAGNESIUM: Magnesium: 1.7 mg/dL (ref 1.7–2.4)

## 2023-10-16 MED ORDER — DEXTROSE 5 % IV SOLN
400.0000 mg/m2 | Freq: Once | INTRAVENOUS | Status: AC
  Administered 2023-10-16: 836 mg via INTRAVENOUS
  Filled 2023-10-16: qty 25

## 2023-10-16 MED ORDER — SODIUM CHLORIDE 0.9 % IV SOLN
2400.0000 mg/m2 | INTRAVENOUS | Status: DC
  Administered 2023-10-16: 5000 mg via INTRAVENOUS
  Filled 2023-10-16: qty 100

## 2023-10-16 MED ORDER — FLUOROURACIL CHEMO INJECTION 2.5 GM/50ML
400.0000 mg/m2 | Freq: Once | INTRAVENOUS | Status: AC
  Administered 2023-10-16: 850 mg via INTRAVENOUS
  Filled 2023-10-16: qty 17

## 2023-10-16 MED ORDER — SODIUM CHLORIDE 0.9 % IV SOLN
6.0000 mg/kg | Freq: Once | INTRAVENOUS | Status: AC
  Administered 2023-10-16: 600 mg via INTRAVENOUS
  Filled 2023-10-16: qty 20

## 2023-10-16 MED ORDER — SODIUM CHLORIDE 0.9 % IV SOLN: Freq: Once | INTRAVENOUS | Status: AC

## 2023-10-16 NOTE — Progress Notes (Signed)
 [facility] OFFICE PROGRESS NOTE   Diagnosis: Colon cancer  INTERVAL HISTORY:   Daniel [name] returns as scheduled.  He completed another cycle of 5-FU/Panitumumab 10/02/2023.  He denies nausea/vomiting.  No mouth sores.  No diarrhea.  Skin rash is stable.  He continues doxycycline.  Objective:  Vital signs in last 24 hours:  Blood pressure 95/76, pulse 100, temperature 98.2 F (36.8 C), resp. rate 18, height 5\' 6"  (1.676 m), weight 209 lb 8 oz (95 kg), SpO2 100%.    HEENT: No thrush or ulcers. Resp: Lungs clear bilaterally. Cardio: Regular rate and rhythm. GI: Abdomen soft and nontender.  No hepatosplenomegaly. Vascular: No leg edema. Skin: Acne type skin rash face and trunk.  No paronychia.  Palms without erythema. Port-A-Cath without erythema.  Lab Results:  Lab Results  Component Value Date   WBC 10.4 10/02/2023   HGB 15.7 10/02/2023   HCT 46.7 10/02/2023   MCV 97.3 10/02/2023   PLT 128 (L) 10/02/2023   NEUTROABS 7.2 10/02/2023    Imaging:  No results found.  Medications: I have reviewed the patient's current medications.  Assessment/Plan: Sigmoid colon mass, multiple liver lesions Ultrasound abdomen 01/08/2023-multifocal hyperechoic liver lesions, largest measuring 8.7 cm in the right hepatic lobe CT abdomen/pelvis 01/08/2023-multiple by lobar hypoattenuating hepatic lesions, masslike thickening of the sigmoid colon with abnormal spiculated pericolonic soft tissue.  IMA lymphadenopathy.  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 Colonoscopy 01/29/2023-malignant tumor in the sigmoid colon (invasive moderately differentiated adenocarcinoma), four 6-9 mm polyps in the rectum, ascending colon and cecum, one 14 mm  polyp in the sigmoid colon (within the rectal polyp biopsies there are 2 similar-appearing fragments showing invasive tumor with associated adenomatous change which is felt to be carryover from the sigmoid mass biopsies, the rectal polyp biopsies also show multiple fragments of tubular adenoma without high-grade dysplasia) Cycle 1 FOLFOX 01/30/2023 Cycle 2 FOLFOX 02/12/2023 Cycle 3 FOLFOX plus Panitumumab 02/27/2023 Cycle 4 FOLFOX plus Panitumumab 03/13/2023 Cycle 5 FOLFOX plus panitumumab 03/27/2023 Cycle 6 FOLFOX plus Panitumumab 04/10/2023 CTs 04/19/2023-decrease size of hepatic metastases, no new lesions, decreased right upper quadrant and retroperitoneal lymph nodes, sigmoid colonic wall thickening no longer seen with a decrease in sigmoid mesenteric lymph nodes Cycle 7 FOLFOX plus panitumumab 04/24/2023, 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 Cycle 8 FOLFOX plus Panitumumab 05/08/2023 Cycle 9 FOLFOX plus Panitumumab 05/22/2023 Cycle 10 FOLFOX plus Panitumumab 06/05/2023 Cycle 11 FOLFOX plus panitumumab 06/19/2023 Cycle 12 FOLFOX plus Panitumumab 07/03/2023, oxaliplatin held due to thrombocytopenia CTs 07/11/2023: Decrease in hepatic metastases, no enlarged abdominal or pelvic nodes, no metastatic disease to the chest Cycle 13 of 5-FU/panitumumab 07/23/2023 Cycle 14 5-FU/Panitumumab 08/07/2023 Cycle 15 5-FU/panitumumab 08/21/2023 Cycle 16 5-FU/Panitumumab 09/04/2023 Cycle 17 5-FU/panitumumab 09/18/2023 Cycle 18 5-FU/Panitumumab 10/02/2023 Cycle 19 5-FU/Panitumumab 10/16/2023 Diabetes Cardiomyopathy Hypertension Hypercholesterolemia Weight loss secondary to [facility], metastatic carcinoma? 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  He has completed 18 cycles of systemic therapy, currently 5-FU/Panitumumab every 2 weeks.  He is tolerating treatment well.  There is no clinical evidence  of disease progression.  Most recent CEA stable in normal range.  Plan to proceed with 5-FU/Panitumumab today as scheduled.  Restaging CTs prior to next office visit.  CBC and chemistry panel reviewed.  Labs adequate  to proceed as above.  Magnesium level was normal.  He will return for follow-up in 2 weeks.  We are available to see him sooner if needed.  Lonna Cobb ANP/GNP-BC   10/16/2023  8:13 AM

## 2023-10-16 NOTE — Progress Notes (Signed)
 Patient seen by Lonna Cobb NP today  Vitals are within treatment parameters:Yes   Labs are within treatment parameters: Yes   Treatment plan has been signed: Yes   Per physician team, Patient is ready for treatment and there are NO modifications to the treatment plan.

## 2023-10-16 NOTE — Patient Instructions (Addendum)
 [facility]   Discharge Instructions: The chemotherapy medication bag should finish at 46 hours, 96 hours, or 7 days. For example, if your pump is scheduled for 46 hours and it was put on at 4:00 p.m., it should finish at 2:00 p.m. the day it is scheduled to come off regardless of your appointment time.     Estimated time to finish at 9:30 FRIDAY October 18, 2023.   If the display on your pump reads "Low Volume" and it is beeping, take the batteries out of the pump and come to the cancer center for it to be taken off.   If the pump alarms go off prior to the pump reading "Low Volume" then call [phone] and someone can assist you.  If the plunger comes out and the chemotherapy medication is leaking out, please use your home chemo spill kit to clean up the spill. Do NOT use paper towels or other household products.  If you have problems or questions regarding your pump, please call either [phone] (24 hours a day) or the cancer center Monday-Friday 8:00 a.m.- 4:30 p.m. at the clinic number and we will assist you. If you are unable to get assistance, then go to the nearest Emergency Department and ask the staff to contact the IV team for assistance.   Thank you for choosing [facility] to provide your oncology and hematology care.   If you have a lab appointment with the Cancer Center, please go directly to the Cancer Center and check in at the registration area.   Wear comfortable clothing and clothing appropriate for easy access to any Portacath or PICC line.   We strive to give you quality time with your provider. You may need to reschedule your appointment if you arrive late (15 or more minutes).  Arriving late affects you and other patients whose appointments are after yours.  Also, if you miss three or more appointments without notifying the office, you may be dismissed from the clinic at the provider's  discretion.      For prescription refill requests, have your pharmacy contact our office and allow 72 hours for refills to be completed.    Today you received the following chemotherapy and/or immunotherapy agents VECTIBIX/LEUCOVORIN/FLUOROURACIL      To help prevent nausea and vomiting after your treatment, we encourage you to take your nausea medication as directed.  BELOW ARE SYMPTOMS THAT SHOULD BE REPORTED IMMEDIATELY: *FEVER GREATER THAN 100.4 F (38 C) OR HIGHER *CHILLS OR SWEATING *NAUSEA AND VOMITING THAT IS NOT CONTROLLED WITH YOUR NAUSEA MEDICATION *UNUSUAL SHORTNESS OF BREATH *UNUSUAL BRUISING OR BLEEDING *URINARY PROBLEMS (pain or burning when urinating, or frequent urination) *BOWEL PROBLEMS (unusual diarrhea, constipation, pain near the anus) TENDERNESS IN MOUTH AND THROAT WITH OR WITHOUT PRESENCE OF ULCERS (sore throat, sores in mouth, or a toothache) UNUSUAL RASH, SWELLING OR PAIN  UNUSUAL VAGINAL DISCHARGE OR ITCHING   Items with * indicate a potential emergency and should be followed up as soon as possible or go to the Emergency Department if any problems should occur.  Please show the CHEMOTHERAPY ALERT CARD or IMMUNOTHERAPY ALERT CARD at check-in to the Emergency Department and triage nurse.  Should you have questions after your visit or need to cancel or reschedule your appointment, please contact [facility]  Dept: [phone]  and follow the prompts.  Office hours  are 8:00 a.m. to 4:30 p.m. Monday - Friday. Please note that voicemails left after 4:00 p.m. may not be returned until the following business day.  We are closed weekends and major holidays. You have access to a nurse at all times for urgent questions. Please call the main number to the clinic Dept: [phone] and follow the prompts.   For any non-urgent questions, you may also contact your provider using MyChart. We now offer e-Visits for anyone  10 and older to request care online for non-urgent symptoms. For details visit [email or web address].   Also download the MyChart app! Go to the app store, search "MyChart", open the app, select [place], and log in with your MyChart username and password.  Panitumumab Injection What is this medication? PANITUMUMAB (pan i TOOM ue mab) treats colorectal cancer. It works by blocking a protein that causes cancer cells to grow and multiply. This helps to slow or stop the spread of cancer cells. It is a monoclonal antibody. This medicine may be used for other purposes; ask your health care provider or pharmacist if you have questions. COMMON BRAND NAME(S): Vectibix What should I tell my care team before I take this medication? They need to know if you have any of these conditions: Eye disease Low levels of magnesium in the blood Lung disease An unusual or allergic reaction to panitumumab, other medications, foods, dyes, or preservatives Pregnant or trying to get pregnant Breast-feeding How should I use this medication? This medication is injected into a vein. It is given by your care team in a hospital or clinic setting. Talk to your care team about the use of this medication in children. Special care may be needed. Overdosage: If you think you have taken too much of this medicine contact a poison control center or emergency room at once. NOTE: This medicine is only for you. Do not share this medicine with others. What if I miss a dose? Keep appointments for follow-up doses. It is important not to miss your dose. Call your care team if you are unable to keep an appointment. What may interact with this medication? Bevacizumab This list may not describe all possible interactions. Give your health care provider a list of all the medicines, herbs, non-prescription drugs, or dietary supplements you use. Also tell them if you smoke, drink alcohol, or use illegal drugs. Some items may interact with  your medicine. What should I watch for while using this medication? Your condition will be monitored carefully while you are receiving this medication. This medication may make you feel generally unwell. This is not uncommon as chemotherapy can affect healthy cells as well as cancer cells. Report any side effects. Continue your course of treatment even though you feel ill unless your care team tells you to stop. This medication can make you more sensitive to the sun. Keep out of the sun while receiving this medication and for 2 months after stopping therapy. If you cannot avoid being in the sun, wear protective clothing and sunscreen. Do not use sun lamps, tanning beds, or tanning booths. Check with your care team if you have severe diarrhea, nausea, and vomiting or if you sweat a lot. The loss of too much body fluid may make it dangerous for you to take this medication. This medication may cause serious skin reactions. They can happen weeks to months after starting the medication. Contact your care team right away if you notice fevers or flu-like symptoms with a rash. The rash may be  red or purple and then turn into blisters or peeling of the skin. You may also notice a red rash with swelling of the face, lips, or lymph nodes in your neck or under your arms. Talk to your care team if you may be pregnant. Serious birth defects can occur if you take this medication during pregnancy and for 2 months after the last dose. Contraception is recommended while taking this medication and for 2 months after the last dose. Your care team can help you find the option that works for you. Do not breastfeed while taking this medication and for 2 months after the last dose. This medication may cause infertility. Talk to your care team if you are concerned about your fertility. What side effects may I notice from receiving this medication? Side effects that you should report to your care team as soon as  possible: Allergic reactions--skin rash, itching, hives, swelling of the face, lips, tongue, or throat Dry cough, shortness of breath or trouble breathing Eye pain, redness, irritation, or discharge with blurry or decreased vision Infusion reactions--chest pain, shortness of breath or trouble breathing, feeling faint or lightheaded Low magnesium level--muscle pain or cramps, unusual weakness or fatigue, fast or irregular heartbeat, tremors Low potassium level--muscle pain or cramps, unusual weakness or fatigue, fast or irregular heartbeat, constipation Redness, blistering, peeling, or loosening of the skin, including inside the mouth Skin reactions on sun-exposed areas Side effects that usually do not require medical attention (report to your care team if they continue or are bothersome): Change in nail shape, thickness, or color Diarrhea Dry skin Fatigue Nausea Vomiting This list may not describe all possible side effects. Call your doctor for medical advice about side effects. You may report side effects to FDA at 1-800-FDA-1088. Where should I keep my medication? This medication is given in a hospital or clinic. It will not be stored at home. NOTE: This sheet is a summary. It may not cover all possible information. If you have questions about this medicine, talk to your doctor, pharmacist, or health care provider.  2024 Elsevier/Gold Standard (2021-11-22 00:00:00)  Leucovorin Injection What is this medication? LEUCOVORIN (loo koe VOR in) prevents side effects from certain medications, such as methotrexate. It works by increasing folate levels. This helps protect healthy cells in your body. It may also be used to treat anemia caused by low levels of folate. It can also be used with fluorouracil, a type of chemotherapy, to treat colorectal cancer. It works by increasing the effects of fluorouracil in the body. This medicine may be used for other purposes; ask your health care provider or  pharmacist if you have questions. What should I tell my care team before I take this medication? They need to know if you have any of these conditions: Anemia from low levels of vitamin B12 in the blood An unusual or allergic reaction to leucovorin, folic acid, other medications, foods, dyes, or preservatives Pregnant or trying to get pregnant Breastfeeding How should I use this medication? This medication is injected into a vein or a muscle. It is given by your care team in a hospital or clinic setting. Talk to your care team about the use of this medication in children. Special care may be needed. Overdosage: If you think you have taken too much of this medicine contact a poison control center or emergency room at once. NOTE: This medicine is only for you. Do not share this medicine with others. What if I miss a dose? Keep  appointments for follow-up doses. It is important not to miss your dose. Call your care team if you are unable to keep an appointment. What may interact with this medication? Capecitabine Fluorouracil Phenobarbital Phenytoin Primidone Trimethoprim;sulfamethoxazole This list may not describe all possible interactions. Give your health care provider a list of all the medicines, herbs, non-prescription drugs, or dietary supplements you use. Also tell them if you smoke, drink alcohol, or use illegal drugs. Some items may interact with your medicine. What should I watch for while using this medication? Your condition will be monitored carefully while you are receiving this medication. This medication may increase the side effects of 5-fluorouracil. Tell your care team if you have diarrhea or mouth sores that do not get better or that get worse. What side effects may I notice from receiving this medication? Side effects that you should report to your care team as soon as possible: Allergic reactions--skin rash, itching, hives, swelling of the face, lips, tongue, or  throat This list may not describe all possible side effects. Call your doctor for medical advice about side effects. You may report side effects to FDA at 1-800-FDA-1088. Where should I keep my medication? This medication is given in a hospital or clinic. It will not be stored at home. NOTE: This sheet is a summary. It may not cover all possible information. If you have questions about this medicine, talk to your doctor, pharmacist, or health care provider.  2024 Elsevier/Gold Standard (2021-12-12 00:00:00)  Fluorouracil Injection What is this medication? FLUOROURACIL (flure oh YOOR a sil) treats some types of cancer. It works by slowing down the growth of cancer cells. This medicine may be used for other purposes; ask your health care provider or pharmacist if you have questions. COMMON BRAND NAME(S): Adrucil What should I tell my care team before I take this medication? They need to know if you have any of these conditions: Blood disorders Dihydropyrimidine dehydrogenase (DPD) deficiency Infection, such as chickenpox, cold sores, herpes Kidney disease Liver disease Poor nutrition Recent or ongoing radiation therapy An unusual or allergic reaction to fluorouracil, other medications, foods, dyes, or preservatives If you or your partner are pregnant or trying to get pregnant Breast-feeding How should I use this medication? This medication is injected into a vein. It is administered by your care team in a hospital or clinic setting. Talk to your care team about the use of this medication in children. Special care may be needed. Overdosage: If you think you have taken too much of this medicine contact a poison control center or emergency room at once. NOTE: This medicine is only for you. Do not share this medicine with others. What if I miss a dose? Keep appointments for follow-up doses. It is important not to miss your dose. Call your care team if you are unable to keep an  appointment. What may interact with this medication? Do not take this medication with any of the following: Live virus vaccines This medication may also interact with the following: Medications that treat or prevent blood clots, such as warfarin, enoxaparin, dalteparin This list may not describe all possible interactions. Give your health care provider a list of all the medicines, herbs, non-prescription drugs, or dietary supplements you use. Also tell them if you smoke, drink alcohol, or use illegal drugs. Some items may interact with your medicine. What should I watch for while using this medication? Your condition will be monitored carefully while you are receiving this medication. This medication may make  you feel generally unwell. This is not uncommon as chemotherapy can affect healthy cells as well as cancer cells. Report any side effects. Continue your course of treatment even though you feel ill unless your care team tells you to stop. In some cases, you may be given additional medications to help with side effects. Follow all directions for their use. This medication may increase your risk of getting an infection. Call your care team for advice if you get a fever, chills, sore throat, or other symptoms of a cold or flu. Do not treat yourself. Try to avoid being around people who are sick. This medication may increase your risk to bruise or bleed. Call your care team if you notice any unusual bleeding. Be careful brushing or flossing your teeth or using a toothpick because you may get an infection or bleed more easily. If you have any dental work done, tell your dentist you are receiving this medication. Avoid taking medications that contain aspirin, acetaminophen, ibuprofen, naproxen, or ketoprofen unless instructed by your care team. These medications may hide a fever. Do not treat diarrhea with over the counter products. Contact your care team if you have diarrhea that lasts more than 2  days or if it is severe and watery. This medication can make you more sensitive to the sun. Keep out of the sun. If you cannot avoid being in the sun, wear protective clothing and sunscreen. Do not use sun lamps, tanning beds, or tanning booths. Talk to your care team if you or your partner wish to become pregnant or think you might be pregnant. This medication can cause serious birth defects if taken during pregnancy and for 3 months after the last dose. A reliable form of contraception is recommended while taking this medication and for 3 months after the last dose. Talk to your care team about effective forms of contraception. Do not father a child while taking this medication and for 3 months after the last dose. Use a condom while having sex during this time period. Do not breastfeed while taking this medication. This medication may cause infertility. Talk to your care team if you are concerned about your fertility. What side effects may I notice from receiving this medication? Side effects that you should report to your care team as soon as possible: Allergic reactions--skin rash, itching, hives, swelling of the face, lips, tongue, or throat Heart attack--pain or tightness in the chest, shoulders, arms, or jaw, nausea, shortness of breath, cold or clammy skin, feeling faint or lightheaded Heart failure--shortness of breath, swelling of the ankles, feet, or hands, sudden weight gain, unusual weakness or fatigue Heart rhythm changes--fast or irregular heartbeat, dizziness, feeling faint or lightheaded, chest pain, trouble breathing High ammonia level--unusual weakness or fatigue, confusion, loss of appetite, nausea, vomiting, seizures Infection--fever, chills, cough, sore throat, wounds that don't heal, pain or trouble when passing urine, general feeling of discomfort or being unwell Low red blood cell level--unusual weakness or fatigue, dizziness, headache, trouble breathing Pain, tingling, or  numbness in the hands or feet, muscle weakness, change in vision, confusion or trouble speaking, loss of balance or coordination, trouble walking, seizures Redness, swelling, and blistering of the skin over hands and feet Severe or prolonged diarrhea Unusual bruising or bleeding Side effects that usually do not require medical attention (report to your care team if they continue or are bothersome): Dry skin Headache Increased tears Nausea Pain, redness, or swelling with sores inside the mouth or throat Sensitivity to light Vomiting  This list may not describe all possible side effects. Call your doctor for medical advice about side effects. You may report side effects to FDA at 1-800-FDA-1088. Where should I keep my medication? This medication is given in a hospital or clinic. It will not be stored at home. NOTE: This sheet is a summary. It may not cover all possible information. If you have questions about this medicine, talk to your doctor, pharmacist, or health care provider.  2024 Elsevier/Gold Standard (2021-11-14 00:00:00)

## 2023-10-17 ENCOUNTER — Other Ambulatory Visit (HOSPITAL_BASED_OUTPATIENT_CLINIC_OR_DEPARTMENT_OTHER): Admitting: Radiology

## 2023-10-18 ENCOUNTER — Inpatient Hospital Stay: Payer: Medicare Other

## 2023-10-18 VITALS — BP 105/76 | HR 100 | Temp 98.0°F | Resp 18

## 2023-10-18 DIAGNOSIS — Z5112 Encounter for antineoplastic immunotherapy: Secondary | ICD-10-CM | POA: Diagnosis not present

## 2023-10-18 DIAGNOSIS — C187 Malignant neoplasm of sigmoid colon: Secondary | ICD-10-CM

## 2023-10-18 MED ORDER — HEPARIN SOD (PORK) LOCK FLUSH 100 UNIT/ML IV SOLN
500.0000 [IU] | Freq: Once | INTRAVENOUS | Status: AC | PRN
  Administered 2023-10-18: 500 [IU]

## 2023-10-18 MED ORDER — SODIUM CHLORIDE 0.9% FLUSH
10.0000 mL | INTRAVENOUS | Status: DC | PRN
  Administered 2023-10-18: 10 mL

## 2023-10-18 NOTE — Patient Instructions (Signed)

## 2023-10-20 ENCOUNTER — Other Ambulatory Visit: Payer: Self-pay

## 2023-10-24 ENCOUNTER — Ambulatory Visit (HOSPITAL_BASED_OUTPATIENT_CLINIC_OR_DEPARTMENT_OTHER)
Admission: RE | Admit: 2023-10-24 | Discharge: 2023-10-24 | Disposition: A | Discharge status: Home / Self Care | Source: Ambulatory Visit | Attending: Nurse Practitioner | Admitting: Nurse Practitioner

## 2023-10-24 DIAGNOSIS — C187 Malignant neoplasm of sigmoid colon: Secondary | ICD-10-CM | POA: Diagnosis not present

## 2023-10-24 MED ORDER — IOHEXOL 300 MG/ML  SOLN
100.0000 mL | Freq: Once | INTRAMUSCULAR | Status: AC | PRN
  Administered 2023-10-24: 100 mL via INTRAVENOUS

## 2023-10-27 ENCOUNTER — Other Ambulatory Visit: Payer: Self-pay | Admitting: Oncology

## 2023-10-27 DIAGNOSIS — C187 Malignant neoplasm of sigmoid colon: Secondary | ICD-10-CM

## 2023-10-30 ENCOUNTER — Inpatient Hospital Stay

## 2023-10-30 ENCOUNTER — Inpatient Hospital Stay: Attending: Oncology | Admitting: Oncology

## 2023-10-30 VITALS — BP 106/71 | HR 96 | Temp 98.2°F | Resp 18 | Ht 66.0 in | Wt 209.8 lb

## 2023-10-30 VITALS — BP 113/88 | HR 77

## 2023-10-30 DIAGNOSIS — Z5111 Encounter for antineoplastic chemotherapy: Secondary | ICD-10-CM | POA: Diagnosis present

## 2023-10-30 DIAGNOSIS — Z5112 Encounter for antineoplastic immunotherapy: Secondary | ICD-10-CM | POA: Insufficient documentation

## 2023-10-30 DIAGNOSIS — C187 Malignant neoplasm of sigmoid colon: Secondary | ICD-10-CM | POA: Insufficient documentation

## 2023-10-30 DIAGNOSIS — D6481 Anemia due to antineoplastic chemotherapy: Secondary | ICD-10-CM | POA: Insufficient documentation

## 2023-10-30 DIAGNOSIS — Z95828 Presence of other vascular implants and grafts: Secondary | ICD-10-CM

## 2023-10-30 DIAGNOSIS — C787 Secondary malignant neoplasm of liver and intrahepatic bile duct: Secondary | ICD-10-CM | POA: Diagnosis not present

## 2023-10-30 LAB — CBC WITH DIFFERENTIAL (CANCER CENTER ONLY)
Abs Immature Granulocytes: 0.04 10*3/uL (ref 0.00–0.07)
Basophils Absolute: 0.1 10*3/uL (ref 0.0–0.1)
Basophils Relative: 1 %
Eosinophils Absolute: 0.3 10*3/uL (ref 0.0–0.5)
Eosinophils Relative: 3 %
HCT: 45.7 % (ref 39.0–52.0)
Hemoglobin: 15.7 g/dL (ref 13.0–17.0)
Immature Granulocytes: 0 %
Lymphocytes Relative: 21 %
Lymphs Abs: 2.1 10*3/uL (ref 0.7–4.0)
MCH: 32.8 pg (ref 26.0–34.0)
MCHC: 34.4 g/dL (ref 30.0–36.0)
MCV: 95.6 fL (ref 80.0–100.0)
Monocytes Absolute: 0.7 10*3/uL (ref 0.1–1.0)
Monocytes Relative: 7 %
Neutro Abs: 6.7 10*3/uL (ref 1.7–7.7)
Neutrophils Relative %: 68 %
Platelet Count: 140 10*3/uL — ABNORMAL LOW (ref 150–400)
RBC: 4.78 MIL/uL (ref 4.22–5.81)
RDW: 14.4 % (ref 11.5–15.5)
WBC Count: 9.8 10*3/uL (ref 4.0–10.5)
nRBC: 0 % (ref 0.0–0.2)

## 2023-10-30 LAB — CMP (CANCER CENTER ONLY)
ALT: 27 U/L (ref 0–44)
AST: 25 U/L (ref 15–41)
Albumin: 4.1 g/dL (ref 3.5–5.0)
Alkaline Phosphatase: 94 U/L (ref 38–126)
Anion gap: 7 (ref 5–15)
BUN: 15 mg/dL (ref 6–20)
CO2: 27 mmol/L (ref 22–32)
Calcium: 8.6 mg/dL — ABNORMAL LOW (ref 8.9–10.3)
Chloride: 107 mmol/L (ref 98–111)
Creatinine: 0.72 mg/dL (ref 0.61–1.24)
GFR, Estimated: >60 mL/min (ref >60)
Glucose, Bld: 105 mg/dL — ABNORMAL HIGH (ref 70–99)
Potassium: 3.5 mmol/L (ref 3.5–5.1)
Sodium: 141 mmol/L (ref 135–145)
Total Bilirubin: 0.6 mg/dL (ref 0.0–1.2)
Total Protein: 6.9 g/dL (ref 6.5–8.1)

## 2023-10-30 LAB — CEA (ACCESS): CEA (CHCC): 4.06 ng/mL (ref 0.00–5.00)

## 2023-10-30 LAB — MAGNESIUM: Magnesium: 1.7 mg/dL (ref 1.7–2.4)

## 2023-10-30 MED ORDER — FLUOROURACIL CHEMO INJECTION 2.5 GM/50ML
400.0000 mg/m2 | Freq: Once | INTRAVENOUS | Status: AC
  Administered 2023-10-30: 850 mg via INTRAVENOUS
  Filled 2023-10-30: qty 17

## 2023-10-30 MED ORDER — SODIUM CHLORIDE 0.9 % IV SOLN: Freq: Once | INTRAVENOUS | Status: AC

## 2023-10-30 MED ORDER — SODIUM CHLORIDE 0.9 % IV SOLN
6.0000 mg/kg | Freq: Once | INTRAVENOUS | Status: AC
  Administered 2023-10-30: 600 mg via INTRAVENOUS
  Filled 2023-10-30: qty 20

## 2023-10-30 MED ORDER — SODIUM CHLORIDE 0.9 % IV SOLN
2400.0000 mg/m2 | INTRAVENOUS | Status: DC
  Administered 2023-10-30: 5000 mg via INTRAVENOUS
  Filled 2023-10-30: qty 100

## 2023-10-30 MED ORDER — LEUCOVORIN CALCIUM INJECTION 350 MG
400.0000 mg/m2 | Freq: Once | INTRAMUSCULAR | Status: AC
  Administered 2023-10-30: 836 mg via INTRAVENOUS
  Filled 2023-10-30: qty 25

## 2023-10-30 MED ORDER — SODIUM CHLORIDE 0.9% FLUSH
10.0000 mL | Freq: Once | INTRAVENOUS | Status: AC
Start: 2023-10-30 — End: 2023-10-30
  Administered 2023-10-30: 10 mL via INTRAVENOUS

## 2023-10-30 NOTE — Patient Instructions (Signed)

## 2023-10-30 NOTE — Progress Notes (Signed)
 [facility] OFFICE PROGRESS NOTE   Diagnosis: Colon cancer  INTERVAL HISTORY:   Daniel [name] completed another cycle of 5-FU/panitumumab on 10/16/2023.  No nausea, mouth sores, or diarrhea.  He reports a stable rash.  He has developed inflammation surrounding the left great toenail.  He is using Neosporin.  Objective:  Vital signs in last 24 hours:  Blood pressure 106/71, pulse 96, temperature 98.2 F (36.8 C), temperature source Temporal, resp. rate 18, height 5\' 6"  (1.676 m), weight 209 lb 12.8 oz (95.2 kg), SpO2 98%.    HEENT: No thrush or ulcers Lymphatics: No cervical, supraclavicular, axillary, or inguinal nodes Resp: Lungs clear bilaterally Cardio: Regular rate and rhythm GI: No hepatosplenomegaly, nontender, no mass Vascular: No leg edema  Skin: Small linear ulcer ready distal finger, erythema and induration surrounding the left great toenail, dry acne type rash over the trunk, dry erythema over the face  Portacath/PICC-without erythema  Lab Results:  Lab Results  Component Value Date   WBC 9.8 10/30/2023   HGB 15.7 10/30/2023   HCT 45.7 10/30/2023   MCV 95.6 10/30/2023   PLT 140 (L) 10/30/2023   NEUTROABS 6.7 10/30/2023    CMP  Lab Results  Component Value Date   NA 141 10/30/2023   K 3.5 10/30/2023   CL 107 10/30/2023   CO2 27 10/30/2023   GLUCOSE 105 (H) 10/30/2023   BUN 15 10/30/2023   CREATININE 0.72 10/30/2023   CALCIUM 8.6 (L) 10/30/2023   PROT 6.9 10/30/2023   ALBUMIN 4.1 10/30/2023   AST 25 10/30/2023   ALT 27 10/30/2023   ALKPHOS 94 10/30/2023   BILITOT 0.6 10/30/2023   GFRNONAA >60 10/30/2023   GFRAA >60 07/23/2016    Lab Results  Component Value Date   CEA 3.30 10/16/2023     Medications: I have reviewed the patient's current medications.   Assessment/Plan: Sigmoid colon mass, multiple liver lesions Ultrasound abdomen 01/08/2023-multifocal hyperechoic liver lesions, largest measuring 8.7 cm in the right hepatic  lobe CT abdomen/pelvis 01/08/2023-multiple by lobar hypoattenuating hepatic lesions, masslike thickening of the sigmoid colon with abnormal spiculated pericolonic soft tissue.  IMA lymphadenopathy.  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 Colonoscopy 01/29/2023-malignant tumor in the sigmoid colon (invasive moderately differentiated adenocarcinoma), four 6-9 mm polyps in the rectum, ascending colon and cecum, one 14 mm polyp in the sigmoid colon (within the rectal polyp biopsies there are 2 similar-appearing fragments showing invasive tumor with associated adenomatous change which is felt to be carryover from the sigmoid mass biopsies, the rectal polyp biopsies also show multiple fragments of tubular adenoma without high-grade dysplasia) Cycle 1 FOLFOX 01/30/2023 Cycle 2 FOLFOX 02/12/2023 Cycle 3 FOLFOX plus Panitumumab 02/27/2023 Cycle 4 FOLFOX plus Panitumumab 03/13/2023 Cycle 5 FOLFOX plus panitumumab 03/27/2023 Cycle 6 FOLFOX plus Panitumumab 04/10/2023 CTs 04/19/2023-decrease size of hepatic metastases, no new lesions, decreased right upper quadrant and retroperitoneal lymph nodes, sigmoid colonic wall thickening no longer seen with a decrease in sigmoid mesenteric lymph nodes Cycle 7 FOLFOX plus panitumumab 04/24/2023, 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 Cycle 8 FOLFOX plus Panitumumab 05/08/2023 Cycle 9 FOLFOX plus Panitumumab 05/22/2023 Cycle 10 FOLFOX plus Panitumumab 06/05/2023 Cycle 11 FOLFOX plus panitumumab 06/19/2023 Cycle 12 FOLFOX plus Panitumumab 07/03/2023, oxaliplatin held due to thrombocytopenia CTs 07/11/2023: Decrease in hepatic metastases, no enlarged abdominal or pelvic nodes, no  metastatic disease to the  chest Cycle 13 of 5-FU/panitumumab 07/23/2023 Cycle 14 5-FU/Panitumumab 08/07/2023 Cycle 15 5-FU/panitumumab 08/21/2023 Cycle 16 5-FU/Panitumumab 09/04/2023 Cycle 17 5-FU/panitumumab 09/18/2023 Cycle 18 5-FU/Panitumumab 10/02/2023 Cycle 19 5-FU/Panitumumab 10/16/2023 CTs 10/24/2023: Decrease in hepatic metastases, no evidence of disease progression Cycle 20 5-FU/panitumumab 10/30/2023 Diabetes Cardiomyopathy Hypertension Hypercholesterolemia Weight loss secondary to [facility], metastatic carcinoma? 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  I reviewed the restaging CT findings and images with him.  The CTs reveal evidence of a continued response to systemic therapy.  He will complete another cycle of 5-FU/panitumumab today.  Mr. Lorusso will use Neosporin and a bandage at the left great toe.  He will return for an office visit and chemotherapy in 2 weeks.  We will follow-up on the CEA from today.  Thornton Papas, MD  10/30/2023  11:04 AM

## 2023-10-30 NOTE — Patient Instructions (Signed)
 [facility]   Discharge Instructions: The chemotherapy medication bag should finish at 46 hours, 96 hours, or 7 days. For example, if your pump is scheduled for 46 hours and it was put on at 4:00 p.m., it should finish at 2:00 p.m. the day it is scheduled to come off regardless of your appointment time.     Estimated time to finish at 11:45 FRIDAY, November 01, 2023.   If the display on your pump reads "Low Volume" and it is beeping, take the batteries out of the pump and come to the cancer center for it to be taken off.   If the pump alarms go off prior to the pump reading "Low Volume" then call [phone] and someone can assist you.  If the plunger comes out and the chemotherapy medication is leaking out, please use your home chemo spill kit to clean up the spill. Do NOT use paper towels or other household products.  If you have problems or questions regarding your pump, please call either [phone] (24 hours a day) or the cancer center Monday-Friday 8:00 a.m.- 4:30 p.m. at the clinic number and we will assist you. If you are unable to get assistance, then go to the nearest Emergency Department and ask the staff to contact the IV team for assistance.   Thank you for choosing [facility] to provide your oncology and hematology care.   If you have a lab appointment with the Cancer Center, please go directly to the Cancer Center and check in at the registration area.   Wear comfortable clothing and clothing appropriate for easy access to any Portacath or PICC line.   We strive to give you quality time with your provider. You may need to reschedule your appointment if you arrive late (15 or more minutes).  Arriving late affects you and other patients whose appointments are after yours.  Also, if you miss three or more appointments without notifying the office, you may be dismissed from the clinic at the provider's  discretion.      For prescription refill requests, have your pharmacy contact our office and allow 72 hours for refills to be completed.    Today you received the following chemotherapy and/or immunotherapy agents VECTIBIX/LEUCOVORIN/FLUOROURACIL.      To help prevent nausea and vomiting after your treatment, we encourage you to take your nausea medication as directed.  BELOW ARE SYMPTOMS THAT SHOULD BE REPORTED IMMEDIATELY: *FEVER GREATER THAN 100.4 F (38 C) OR HIGHER *CHILLS OR SWEATING *NAUSEA AND VOMITING THAT IS NOT CONTROLLED WITH YOUR NAUSEA MEDICATION *UNUSUAL SHORTNESS OF BREATH *UNUSUAL BRUISING OR BLEEDING *URINARY PROBLEMS (pain or burning when urinating, or frequent urination) *BOWEL PROBLEMS (unusual diarrhea, constipation, pain near the anus) TENDERNESS IN MOUTH AND THROAT WITH OR WITHOUT PRESENCE OF ULCERS (sore throat, sores in mouth, or a toothache) UNUSUAL RASH, SWELLING OR PAIN  UNUSUAL VAGINAL DISCHARGE OR ITCHING   Items with * indicate a potential emergency and should be followed up as soon as possible or go to the Emergency Department if any problems should occur.  Please show the CHEMOTHERAPY ALERT CARD or IMMUNOTHERAPY ALERT CARD at check-in to the Emergency Department and triage nurse.  Should you have questions after your visit or need to cancel or reschedule your appointment, please contact [facility]  Dept: [phone]  and follow the prompts.  Office hours  are 8:00 a.m. to 4:30 p.m. Monday - Friday. Please note that voicemails left after 4:00 p.m. may not be returned until the following business day.  We are closed weekends and major holidays. You have access to a nurse at all times for urgent questions. Please call the main number to the clinic Dept: [phone] and follow the prompts.   For any non-urgent questions, you may also contact your provider using MyChart. We now offer e-Visits for anyone  95 and older to request care online for non-urgent symptoms. For details visit [email or web address].   Also download the MyChart app! Go to the app store, search "MyChart", open the app, select [place], and log in with your MyChart username and password.  Panitumumab Injection What is this medication? PANITUMUMAB (pan i TOOM ue mab) treats colorectal cancer. It works by blocking a protein that causes cancer cells to grow and multiply. This helps to slow or stop the spread of cancer cells. It is a monoclonal antibody. This medicine may be used for other purposes; ask your health care provider or pharmacist if you have questions. COMMON BRAND NAME(S): Vectibix What should I tell my care team before I take this medication? They need to know if you have any of these conditions: Eye disease Low levels of magnesium in the blood Lung disease An unusual or allergic reaction to panitumumab, other medications, foods, dyes, or preservatives Pregnant or trying to get pregnant Breast-feeding How should I use this medication? This medication is injected into a vein. It is given by your care team in a hospital or clinic setting. Talk to your care team about the use of this medication in children. Special care may be needed. Overdosage: If you think you have taken too much of this medicine contact a poison control center or emergency room at once. NOTE: This medicine is only for you. Do not share this medicine with others. What if I miss a dose? Keep appointments for follow-up doses. It is important not to miss your dose. Call your care team if you are unable to keep an appointment. What may interact with this medication? Bevacizumab This list may not describe all possible interactions. Give your health care provider a list of all the medicines, herbs, non-prescription drugs, or dietary supplements you use. Also tell them if you smoke, drink alcohol, or use illegal drugs. Some items may interact with  your medicine. What should I watch for while using this medication? Your condition will be monitored carefully while you are receiving this medication. This medication may make you feel generally unwell. This is not uncommon as chemotherapy can affect healthy cells as well as cancer cells. Report any side effects. Continue your course of treatment even though you feel ill unless your care team tells you to stop. This medication can make you more sensitive to the sun. Keep out of the sun while receiving this medication and for 2 months after stopping therapy. If you cannot avoid being in the sun, wear protective clothing and sunscreen. Do not use sun lamps, tanning beds, or tanning booths. Check with your care team if you have severe diarrhea, nausea, and vomiting or if you sweat a lot. The loss of too much body fluid may make it dangerous for you to take this medication. This medication may cause serious skin reactions. They can happen weeks to months after starting the medication. Contact your care team right away if you notice fevers or flu-like symptoms with a rash. The rash may be  red or purple and then turn into blisters or peeling of the skin. You may also notice a red rash with swelling of the face, lips, or lymph nodes in your neck or under your arms. Talk to your care team if you may be pregnant. Serious birth defects can occur if you take this medication during pregnancy and for 2 months after the last dose. Contraception is recommended while taking this medication and for 2 months after the last dose. Your care team can help you find the option that works for you. Do not breastfeed while taking this medication and for 2 months after the last dose. This medication may cause infertility. Talk to your care team if you are concerned about your fertility. What side effects may I notice from receiving this medication? Side effects that you should report to your care team as soon as  possible: Allergic reactions--skin rash, itching, hives, swelling of the face, lips, tongue, or throat Dry cough, shortness of breath or trouble breathing Eye pain, redness, irritation, or discharge with blurry or decreased vision Infusion reactions--chest pain, shortness of breath or trouble breathing, feeling faint or lightheaded Low magnesium level--muscle pain or cramps, unusual weakness or fatigue, fast or irregular heartbeat, tremors Low potassium level--muscle pain or cramps, unusual weakness or fatigue, fast or irregular heartbeat, constipation Redness, blistering, peeling, or loosening of the skin, including inside the mouth Skin reactions on sun-exposed areas Side effects that usually do not require medical attention (report to your care team if they continue or are bothersome): Change in nail shape, thickness, or color Diarrhea Dry skin Fatigue Nausea Vomiting This list may not describe all possible side effects. Call your doctor for medical advice about side effects. You may report side effects to FDA at 1-800-FDA-1088. Where should I keep my medication? This medication is given in a hospital or clinic. It will not be stored at home. NOTE: This sheet is a summary. It may not cover all possible information. If you have questions about this medicine, talk to your doctor, pharmacist, or health care provider.  2024 Elsevier/Gold Standard (2021-11-22 00:00:00) Leucovorin Injection What is this medication? LEUCOVORIN (loo koe VOR in) prevents side effects from certain medications, such as methotrexate. It works by increasing folate levels. This helps protect healthy cells in your body. It may also be used to treat anemia caused by low levels of folate. It can also be used with fluorouracil, a type of chemotherapy, to treat colorectal cancer. It works by increasing the effects of fluorouracil in the body. This medicine may be used for other purposes; ask your health care provider or  pharmacist if you have questions. What should I tell my care team before I take this medication? They need to know if you have any of these conditions: Anemia from low levels of vitamin B12 in the blood An unusual or allergic reaction to leucovorin, folic acid, other medications, foods, dyes, or preservatives Pregnant or trying to get pregnant Breastfeeding How should I use this medication? This medication is injected into a vein or a muscle. It is given by your care team in a hospital or clinic setting. Talk to your care team about the use of this medication in children. Special care may be needed. Overdosage: If you think you have taken too much of this medicine contact a poison control center or emergency room at once. NOTE: This medicine is only for you. Do not share this medicine with others. What if I miss a dose? Keep appointments  for follow-up doses. It is important not to miss your dose. Call your care team if you are unable to keep an appointment. What may interact with this medication? Capecitabine Fluorouracil Phenobarbital Phenytoin Primidone Trimethoprim;sulfamethoxazole This list may not describe all possible interactions. Give your health care provider a list of all the medicines, herbs, non-prescription drugs, or dietary supplements you use. Also tell them if you smoke, drink alcohol, or use illegal drugs. Some items may interact with your medicine. What should I watch for while using this medication? Your condition will be monitored carefully while you are receiving this medication. This medication may increase the side effects of 5-fluorouracil. Tell your care team if you have diarrhea or mouth sores that do not get better or that get worse. What side effects may I notice from receiving this medication? Side effects that you should report to your care team as soon as possible: Allergic reactions--skin rash, itching, hives, swelling of the face, lips, tongue, or  throat This list may not describe all possible side effects. Call your doctor for medical advice about side effects. You may report side effects to FDA at 1-800-FDA-1088. Where should I keep my medication? This medication is given in a hospital or clinic. It will not be stored at home. NOTE: This sheet is a summary. It may not cover all possible information. If you have questions about this medicine, talk to your doctor, pharmacist, or health care provider.  2024 Elsevier/Gold Standard (2021-12-12 00:00:00)  Fluorouracil Injection What is this medication? FLUOROURACIL (flure oh YOOR a sil) treats some types of cancer. It works by slowing down the growth of cancer cells. This medicine may be used for other purposes; ask your health care provider or pharmacist if you have questions. COMMON BRAND NAME(S): Adrucil What should I tell my care team before I take this medication? They need to know if you have any of these conditions: Blood disorders Dihydropyrimidine dehydrogenase (DPD) deficiency Infection, such as chickenpox, cold sores, herpes Kidney disease Liver disease Poor nutrition Recent or ongoing radiation therapy An unusual or allergic reaction to fluorouracil, other medications, foods, dyes, or preservatives If you or your partner are pregnant or trying to get pregnant Breast-feeding How should I use this medication? This medication is injected into a vein. It is administered by your care team in a hospital or clinic setting. Talk to your care team about the use of this medication in children. Special care may be needed. Overdosage: If you think you have taken too much of this medicine contact a poison control center or emergency room at once. NOTE: This medicine is only for you. Do not share this medicine with others. What if I miss a dose? Keep appointments for follow-up doses. It is important not to miss your dose. Call your care team if you are unable to keep an  appointment. What may interact with this medication? Do not take this medication with any of the following: Live virus vaccines This medication may also interact with the following: Medications that treat or prevent blood clots, such as warfarin, enoxaparin, dalteparin This list may not describe all possible interactions. Give your health care provider a list of all the medicines, herbs, non-prescription drugs, or dietary supplements you use. Also tell them if you smoke, drink alcohol, or use illegal drugs. Some items may interact with your medicine. What should I watch for while using this medication? Your condition will be monitored carefully while you are receiving this medication. This medication may make you  feel generally unwell. This is not uncommon as chemotherapy can affect healthy cells as well as cancer cells. Report any side effects. Continue your course of treatment even though you feel ill unless your care team tells you to stop. In some cases, you may be given additional medications to help with side effects. Follow all directions for their use. This medication may increase your risk of getting an infection. Call your care team for advice if you get a fever, chills, sore throat, or other symptoms of a cold or flu. Do not treat yourself. Try to avoid being around people who are sick. This medication may increase your risk to bruise or bleed. Call your care team if you notice any unusual bleeding. Be careful brushing or flossing your teeth or using a toothpick because you may get an infection or bleed more easily. If you have any dental work done, tell your dentist you are receiving this medication. Avoid taking medications that contain aspirin, acetaminophen, ibuprofen, naproxen, or ketoprofen unless instructed by your care team. These medications may hide a fever. Do not treat diarrhea with over the counter products. Contact your care team if you have diarrhea that lasts more than 2  days or if it is severe and watery. This medication can make you more sensitive to the sun. Keep out of the sun. If you cannot avoid being in the sun, wear protective clothing and sunscreen. Do not use sun lamps, tanning beds, or tanning booths. Talk to your care team if you or your partner wish to become pregnant or think you might be pregnant. This medication can cause serious birth defects if taken during pregnancy and for 3 months after the last dose. A reliable form of contraception is recommended while taking this medication and for 3 months after the last dose. Talk to your care team about effective forms of contraception. Do not father a child while taking this medication and for 3 months after the last dose. Use a condom while having sex during this time period. Do not breastfeed while taking this medication. This medication may cause infertility. Talk to your care team if you are concerned about your fertility. What side effects may I notice from receiving this medication? Side effects that you should report to your care team as soon as possible: Allergic reactions--skin rash, itching, hives, swelling of the face, lips, tongue, or throat Heart attack--pain or tightness in the chest, shoulders, arms, or jaw, nausea, shortness of breath, cold or clammy skin, feeling faint or lightheaded Heart failure--shortness of breath, swelling of the ankles, feet, or hands, sudden weight gain, unusual weakness or fatigue Heart rhythm changes--fast or irregular heartbeat, dizziness, feeling faint or lightheaded, chest pain, trouble breathing High ammonia level--unusual weakness or fatigue, confusion, loss of appetite, nausea, vomiting, seizures Infection--fever, chills, cough, sore throat, wounds that don't heal, pain or trouble when passing urine, general feeling of discomfort or being unwell Low red blood cell level--unusual weakness or fatigue, dizziness, headache, trouble breathing Pain, tingling, or  numbness in the hands or feet, muscle weakness, change in vision, confusion or trouble speaking, loss of balance or coordination, trouble walking, seizures Redness, swelling, and blistering of the skin over hands and feet Severe or prolonged diarrhea Unusual bruising or bleeding Side effects that usually do not require medical attention (report to your care team if they continue or are bothersome): Dry skin Headache Increased tears Nausea Pain, redness, or swelling with sores inside the mouth or throat Sensitivity to light Vomiting This  list may not describe all possible side effects. Call your doctor for medical advice about side effects. You may report side effects to FDA at 1-800-FDA-1088. Where should I keep my medication? This medication is given in a hospital or clinic. It will not be stored at home. NOTE: This sheet is a summary. It may not cover all possible information. If you have questions about this medicine, talk to your doctor, pharmacist, or health care provider.  2024 Elsevier/Gold Standard (2021-11-14 00:00:00)

## 2023-10-30 NOTE — Progress Notes (Signed)
 Patient seen by Dr. Thornton Papas today  Vitals are within treatment parameters:Yes   Labs are within treatment parameters: Yes   Treatment plan has been signed: Yes   Per physician team, Patient is ready for treatment and there are NO modifications to the treatment plan.

## 2023-10-31 ENCOUNTER — Other Ambulatory Visit

## 2023-10-31 ENCOUNTER — Ambulatory Visit

## 2023-10-31 ENCOUNTER — Other Ambulatory Visit: Payer: Self-pay

## 2023-10-31 ENCOUNTER — Ambulatory Visit: Admitting: Oncology

## 2023-11-01 ENCOUNTER — Inpatient Hospital Stay

## 2023-11-01 VITALS — BP 96/67 | HR 86 | Temp 98.1°F | Resp 18

## 2023-11-01 DIAGNOSIS — Z5112 Encounter for antineoplastic immunotherapy: Secondary | ICD-10-CM | POA: Diagnosis not present

## 2023-11-01 DIAGNOSIS — C187 Malignant neoplasm of sigmoid colon: Secondary | ICD-10-CM

## 2023-11-01 MED ORDER — HEPARIN SOD (PORK) LOCK FLUSH 100 UNIT/ML IV SOLN
500.0000 [IU] | Freq: Once | INTRAVENOUS | Status: AC | PRN
Start: 2023-11-01 — End: 2023-11-01
  Administered 2023-11-01: 500 [IU]

## 2023-11-01 MED ORDER — SODIUM CHLORIDE 0.9% FLUSH
10.0000 mL | INTRAVENOUS | Status: DC | PRN
  Administered 2023-11-01: 10 mL

## 2023-11-02 ENCOUNTER — Encounter

## 2023-11-08 ENCOUNTER — Other Ambulatory Visit: Payer: Self-pay | Admitting: Oncology

## 2023-11-08 DIAGNOSIS — C187 Malignant neoplasm of sigmoid colon: Secondary | ICD-10-CM

## 2023-11-10 ENCOUNTER — Other Ambulatory Visit: Payer: Self-pay | Admitting: Oncology

## 2023-11-10 DIAGNOSIS — C187 Malignant neoplasm of sigmoid colon: Secondary | ICD-10-CM

## 2023-11-11 ENCOUNTER — Encounter: Payer: Self-pay | Admitting: Oncology

## 2023-11-13 ENCOUNTER — Inpatient Hospital Stay

## 2023-11-13 ENCOUNTER — Encounter: Payer: Self-pay | Admitting: Nurse Practitioner

## 2023-11-13 ENCOUNTER — Inpatient Hospital Stay (HOSPITAL_BASED_OUTPATIENT_CLINIC_OR_DEPARTMENT_OTHER): Admitting: Nurse Practitioner

## 2023-11-13 VITALS — BP 102/74 | HR 72 | Temp 97.8°F

## 2023-11-13 VITALS — BP 96/73 | HR 92 | Temp 98.2°F | Resp 18 | Ht 66.0 in | Wt 215.0 lb

## 2023-11-13 DIAGNOSIS — C187 Malignant neoplasm of sigmoid colon: Secondary | ICD-10-CM

## 2023-11-13 DIAGNOSIS — Z95828 Presence of other vascular implants and grafts: Secondary | ICD-10-CM

## 2023-11-13 DIAGNOSIS — Z5112 Encounter for antineoplastic immunotherapy: Secondary | ICD-10-CM | POA: Diagnosis not present

## 2023-11-13 LAB — CMP (CANCER CENTER ONLY)
ALT: 30 U/L (ref 0–44)
AST: 32 U/L (ref 15–41)
Albumin: 4.1 g/dL (ref 3.5–5.0)
Alkaline Phosphatase: 127 U/L — ABNORMAL HIGH (ref 38–126)
Anion gap: 10 (ref 5–15)
BUN: 17 mg/dL (ref 6–20)
CO2: 27 mmol/L (ref 22–32)
Calcium: 9.2 mg/dL (ref 8.9–10.3)
Chloride: 103 mmol/L (ref 98–111)
Creatinine: 0.89 mg/dL (ref 0.61–1.24)
GFR, Estimated: >60 mL/min (ref >60)
Glucose, Bld: 135 mg/dL — ABNORMAL HIGH (ref 70–99)
Potassium: 3.7 mmol/L (ref 3.5–5.1)
Sodium: 140 mmol/L (ref 135–145)
Total Bilirubin: 0.4 mg/dL (ref 0.0–1.2)
Total Protein: 6.7 g/dL (ref 6.5–8.1)

## 2023-11-13 LAB — CBC WITH DIFFERENTIAL (CANCER CENTER ONLY)
Abs Immature Granulocytes: 0.02 10*3/uL (ref 0.00–0.07)
Basophils Absolute: 0.1 10*3/uL (ref 0.0–0.1)
Basophils Relative: 1 %
Eosinophils Absolute: 0.3 10*3/uL (ref 0.0–0.5)
Eosinophils Relative: 3 %
HCT: 45.3 % (ref 39.0–52.0)
Hemoglobin: 15.5 g/dL (ref 13.0–17.0)
Immature Granulocytes: 0 %
Lymphocytes Relative: 23 %
Lymphs Abs: 2 10*3/uL (ref 0.7–4.0)
MCH: 32.6 pg (ref 26.0–34.0)
MCHC: 34.2 g/dL (ref 30.0–36.0)
MCV: 95.2 fL (ref 80.0–100.0)
Monocytes Absolute: 0.7 10*3/uL (ref 0.1–1.0)
Monocytes Relative: 8 %
Neutro Abs: 5.5 10*3/uL (ref 1.7–7.7)
Neutrophils Relative %: 65 %
Platelet Count: 128 10*3/uL — ABNORMAL LOW (ref 150–400)
RBC: 4.76 MIL/uL (ref 4.22–5.81)
RDW: 14.6 % (ref 11.5–15.5)
WBC Count: 8.6 10*3/uL (ref 4.0–10.5)
nRBC: 0 % (ref 0.0–0.2)

## 2023-11-13 LAB — MAGNESIUM: Magnesium: 1.7 mg/dL (ref 1.7–2.4)

## 2023-11-13 LAB — CEA (ACCESS): CEA (CHCC): 3.95 ng/mL (ref 0.00–5.00)

## 2023-11-13 MED ORDER — SODIUM CHLORIDE 0.9 % IV SOLN
2400.0000 mg/m2 | INTRAVENOUS | Status: DC
  Administered 2023-11-13: 5000 mg via INTRAVENOUS
  Filled 2023-11-13: qty 100

## 2023-11-13 MED ORDER — FLUOROURACIL CHEMO INJECTION 2.5 GM/50ML
400.0000 mg/m2 | Freq: Once | INTRAVENOUS | Status: AC
  Administered 2023-11-13: 850 mg via INTRAVENOUS
  Filled 2023-11-13: qty 17

## 2023-11-13 MED ORDER — SODIUM CHLORIDE 0.9 % IV SOLN
6.0000 mg/kg | Freq: Once | INTRAVENOUS | Status: AC
  Administered 2023-11-13: 600 mg via INTRAVENOUS
  Filled 2023-11-13: qty 20

## 2023-11-13 MED ORDER — SODIUM CHLORIDE 0.9 % IV SOLN: Freq: Once | INTRAVENOUS | Status: AC

## 2023-11-13 MED ORDER — DEXTROSE 5 % IV SOLN
400.0000 mg/m2 | Freq: Once | INTRAVENOUS | Status: AC
  Administered 2023-11-13: 836 mg via INTRAVENOUS
  Filled 2023-11-13: qty 25

## 2023-11-13 MED ORDER — SODIUM CHLORIDE 0.9% FLUSH
10.0000 mL | INTRAVENOUS | Status: DC | PRN
  Administered 2023-11-13: 10 mL via INTRAVENOUS

## 2023-11-13 NOTE — Progress Notes (Signed)
 Patients port flushed without difficulty.  Good blood return noted with no bruising or swelling noted at site.  Patient remains accessed for treatment.

## 2023-11-13 NOTE — Progress Notes (Signed)
 Patient seen by Lonna Cobb NP today  Vitals are within treatment parameters:Yes   Labs are within treatment parameters: Yes   Treatment plan has been signed: Yes   Per physician team, Patient is ready for treatment and there are NO modifications to the treatment plan.

## 2023-11-13 NOTE — Progress Notes (Signed)
 [facility] OFFICE PROGRESS NOTE   Diagnosis: Colon cancer  INTERVAL HISTORY:   Daniel [name] returns as scheduled.  He completed another cycle of 5-FU/Panitumumab  10/30/2023.  He denies nausea/vomiting.  Some areas of mouth tenderness, unsure if there were ulcers.  Now resolved.  No diarrhea.  Stable dry skin and rash.  Right great toe paronychia is better.  Objective:  Vital signs in last 24 hours:  Blood pressure 96/73, pulse 92, temperature 98.2 F (36.8 C), temperature source Temporal, resp. rate 18, height 5\' 6"  (1.676 m), weight 215 lb (97.5 kg), SpO2 100%.    HEENT: No thrush or ulcers. Resp: Lungs clear bilaterally. Cardio: Regular rate and rhythm. GI: Abdomen soft and nontender.  No hepatosplenomegaly. Vascular: No leg edema. Skin: Palms without erythema.  Acne type rash scattered over the face and trunk.  Lateral aspect of the right great toenail bed is erythematous, no drainage. Port-A-Cath without erythema.  Lab Results:  Lab Results  Component Value Date   WBC 9.8 10/30/2023   HGB 15.7 10/30/2023   HCT 45.7 10/30/2023   MCV 95.6 10/30/2023   PLT 140 (L) 10/30/2023   NEUTROABS 6.7 10/30/2023    Imaging:  No results found.  Medications: I have reviewed the patient's current medications.  Assessment/Plan: Sigmoid colon mass, multiple liver lesions Ultrasound abdomen 01/08/2023-multifocal hyperechoic liver lesions, largest measuring 8.7 cm in the right hepatic lobe CT abdomen/pelvis 01/08/2023-multiple by lobar hypoattenuating hepatic lesions, masslike thickening of the sigmoid colon with abnormal spiculated pericolonic soft tissue.  IMA lymphadenopathy.  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 Colonoscopy 01/29/2023-malignant tumor in the sigmoid colon (invasive moderately differentiated adenocarcinoma), four 6-9 mm polyps in the rectum, ascending colon and cecum, one 14 mm polyp in the sigmoid colon (within the rectal polyp biopsies there are 2 similar-appearing fragments showing invasive tumor with associated adenomatous change which is felt to be carryover from the sigmoid mass biopsies, the rectal polyp biopsies also show multiple fragments of tubular adenoma without high-grade dysplasia) Cycle 1 FOLFOX 01/30/2023 Cycle 2 FOLFOX 02/12/2023 Cycle 3 FOLFOX plus Panitumumab  02/27/2023 Cycle 4 FOLFOX plus Panitumumab  03/13/2023 Cycle 5 FOLFOX plus panitumumab  03/27/2023 Cycle 6 FOLFOX plus Panitumumab  04/10/2023 CTs 04/19/2023-decrease size of hepatic metastases, no new lesions, decreased right upper quadrant and retroperitoneal lymph nodes, sigmoid colonic wall thickening no longer seen with a decrease in sigmoid mesenteric lymph nodes Cycle 7 FOLFOX plus panitumumab  04/24/2023, oxaliplatin  dose reduced secondary to thrombocytopenia and neuropathy Cycle 8 FOLFOX plus Panitumumab  05/08/2023 Cycle 9 FOLFOX plus Panitumumab  05/22/2023 Cycle 10 FOLFOX plus Panitumumab  06/05/2023 Cycle 11 FOLFOX plus panitumumab  06/19/2023 Cycle 12 FOLFOX plus Panitumumab  07/03/2023, oxaliplatin  held due to thrombocytopenia CTs 07/11/2023: Decrease in hepatic metastases, no enlarged abdominal or pelvic nodes, no metastatic disease to the chest Cycle 13 of 5-FU/panitumumab  07/23/2023 Cycle 14 5-FU/Panitumumab  08/07/2023 Cycle 15 5-FU/panitumumab  08/21/2023 Cycle 16 5-FU/Panitumumab  09/04/2023 Cycle 17 5-FU/panitumumab  09/18/2023 Cycle 18 5-FU/Panitumumab  10/02/2023 Cycle 19 5-FU/Panitumumab  10/16/2023 CTs 10/24/2023: Decrease in hepatic metastases, no evidence of disease progression Cycle 20 5-FU/panitumumab  10/30/2023 Cycle 21 5-FU/Panitumumab   11/13/2023 Diabetes Cardiomyopathy Hypertension Hypercholesterolemia Weight loss secondary to Mounjaro, metastatic carcinoma? 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  He is under active treatment with 5-FU/Panitumumab  on a 2-week schedule.  He continues to tolerate  treatment well.  There is no clinical evidence of disease progression.  Plan to continue the same, cycle 21 today.  CBC and chemistry panel reviewed.  Labs adequate to proceed as above.  He will return for follow-up and treatment in 2 weeks.      Diana Forster ANP/GNP-BC   11/13/2023  9:50 AM

## 2023-11-13 NOTE — Patient Instructions (Signed)
 [facility] - A DEPT OF [facility]   Discharge Instructions: The chemotherapy medication bag should finish at 46 hours, 96 hours, or 7 days. For example, if your pump is scheduled for 46 hours and it was put on at 4:00 p.m., it should finish at 2:00 p.m. the day it is scheduled to come off regardless of your appointment time.     Estimated time to finish at 11:30 FRIDAY, November 15, 2023.   If the display on your pump reads "Low Volume" and it is beeping, take the batteries out of the pump and come to the cancer center for it to be taken off.   If the pump alarms go off prior to the pump reading "Low Volume" then call [phone] and someone can assist you.  If the plunger comes out and the chemotherapy medication is leaking out, please use your home chemo spill kit to clean up the spill. Do NOT use paper towels or other household products.  If you have problems or questions regarding your pump, please call either [phone] (24 hours a day) or the cancer center Monday-Friday 8:00 a.m.- 4:30 p.m. at the clinic number and we will assist you. If you are unable to get assistance, then go to the nearest Emergency Department and ask the staff to contact the IV team for assistance.   Thank you for choosing [facility] to provide your oncology and hematology care.   If you have a lab appointment with the Cancer Center, please go directly to the Cancer Center and check in at the registration area.   Wear comfortable clothing and clothing appropriate for easy access to any Portacath or PICC line.   We strive to give you quality time with your provider. You may need to reschedule your appointment if you arrive late (15 or more minutes).  Arriving late affects you and other patients whose appointments are after yours.  Also, if you miss three or more appointments without notifying the office, you may be dismissed from the clinic at the provider's  discretion.      For prescription refill requests, have your pharmacy contact our office and allow 72 hours for refills to be completed.    Today you received the following chemotherapy and/or immunotherapy agents VECTIBIX /LEUCOVORIN /FLUOROURACIL       To help prevent nausea and vomiting after your treatment, we encourage you to take your nausea medication as directed.  BELOW ARE SYMPTOMS THAT SHOULD BE REPORTED IMMEDIATELY: *FEVER GREATER THAN 100.4 F (38 C) OR HIGHER *CHILLS OR SWEATING *NAUSEA AND VOMITING THAT IS NOT CONTROLLED WITH YOUR NAUSEA MEDICATION *UNUSUAL SHORTNESS OF BREATH *UNUSUAL BRUISING OR BLEEDING *URINARY PROBLEMS (pain or burning when urinating, or frequent urination) *BOWEL PROBLEMS (unusual diarrhea, constipation, pain near the anus) TENDERNESS IN MOUTH AND THROAT WITH OR WITHOUT PRESENCE OF ULCERS (sore throat, sores in mouth, or a toothache) UNUSUAL RASH, SWELLING OR PAIN  UNUSUAL VAGINAL DISCHARGE OR ITCHING   Items with * indicate a potential emergency and should be followed up as soon as possible or go to the Emergency Department if any problems should occur.  Please show the CHEMOTHERAPY ALERT CARD or IMMUNOTHERAPY ALERT CARD at check-in to the Emergency Department and triage nurse.  Should you have questions after your visit or need to cancel or reschedule your appointment, please contact [facility] - A DEPT OF [facility]  Dept: [phone]  and follow the prompts.  Office hours  are 8:00 a.m. to 4:30 p.m. Monday - Friday. Please note that voicemails left after 4:00 p.m. may not be returned until the following business day.  We are closed weekends and major holidays. You have access to a nurse at all times for urgent questions. Please call the main number to the clinic Dept: [phone] and follow the prompts.   For any non-urgent questions, you may also contact your provider using MyChart. We now offer e-Visits for anyone  3 and older to request care online for non-urgent symptoms. For details visit [email or web address].   Also download the MyChart app! Go to the app store, search "MyChart", open the app, select [place], and log in with your MyChart username and password.  Panitumumab  Injection What is this medication? PANITUMUMAB  (pan i TOOM ue mab) treats colorectal cancer. It works by blocking a protein that causes cancer cells to grow and multiply. This helps to slow or stop the spread of cancer cells. It is a monoclonal antibody. This medicine may be used for other purposes; ask your health care provider or pharmacist if you have questions. COMMON BRAND NAME(S): Vectibix  What should I tell my care team before I take this medication? They need to know if you have any of these conditions: Eye disease Low levels of magnesium  in the blood Lung disease An unusual or allergic reaction to panitumumab , other medications, foods, dyes, or preservatives Pregnant or trying to get pregnant Breast-feeding How should I use this medication? This medication is injected into a vein. It is given by your care team in a hospital or clinic setting. Talk to your care team about the use of this medication in children. Special care may be needed. Overdosage: If you think you have taken too much of this medicine contact a poison control center or emergency room at once. NOTE: This medicine is only for you. Do not share this medicine with others. What if I miss a dose? Keep appointments for follow-up doses. It is important not to miss your dose. Call your care team if you are unable to keep an appointment. What may interact with this medication? Bevacizumab This list may not describe all possible interactions. Give your health care provider a list of all the medicines, herbs, non-prescription drugs, or dietary supplements you use. Also tell them if you smoke, drink alcohol, or use illegal drugs. Some items may interact with  your medicine. What should I watch for while using this medication? Your condition will be monitored carefully while you are receiving this medication. This medication may make you feel generally unwell. This is not uncommon as chemotherapy can affect healthy cells as well as cancer cells. Report any side effects. Continue your course of treatment even though you feel ill unless your care team tells you to stop. This medication can make you more sensitive to the sun. Keep out of the sun while receiving this medication and for 2 months after stopping therapy. If you cannot avoid being in the sun, wear protective clothing and sunscreen. Do not use sun lamps, tanning beds, or tanning booths. Check with your care team if you have severe diarrhea, nausea, and vomiting or if you sweat a lot. The loss of too much body fluid may make it dangerous for you to take this medication. This medication may cause serious skin reactions. They can happen weeks to months after starting the medication. Contact your care team right away if you notice fevers or flu-like symptoms with a rash. The rash may be  red or purple and then turn into blisters or peeling of the skin. You may also notice a red rash with swelling of the face, lips, or lymph nodes in your neck or under your arms. Talk to your care team if you may be pregnant. Serious birth defects can occur if you take this medication during pregnancy and for 2 months after the last dose. Contraception is recommended while taking this medication and for 2 months after the last dose. Your care team can help you find the option that works for you. Do not breastfeed while taking this medication and for 2 months after the last dose. This medication may cause infertility. Talk to your care team if you are concerned about your fertility. What side effects may I notice from receiving this medication? Side effects that you should report to your care team as soon as  possible: Allergic reactions--skin rash, itching, hives, swelling of the face, lips, tongue, or throat Dry cough, shortness of breath or trouble breathing Eye pain, redness, irritation, or discharge with blurry or decreased vision Infusion reactions--chest pain, shortness of breath or trouble breathing, feeling faint or lightheaded Low magnesium  level--muscle pain or cramps, unusual weakness or fatigue, fast or irregular heartbeat, tremors Low potassium level--muscle pain or cramps, unusual weakness or fatigue, fast or irregular heartbeat, constipation Redness, blistering, peeling, or loosening of the skin, including inside the mouth Skin reactions on sun-exposed areas Side effects that usually do not require medical attention (report to your care team if they continue or are bothersome): Change in nail shape, thickness, or color Diarrhea Dry skin Fatigue Nausea Vomiting This list may not describe all possible side effects. Call your doctor for medical advice about side effects. You may report side effects to FDA at 1-800-FDA-1088. Where should I keep my medication? This medication is given in a hospital or clinic. It will not be stored at home. NOTE: This sheet is a summary. It may not cover all possible information. If you have questions about this medicine, talk to your doctor, pharmacist, or health care provider.  2024 Elsevier/Gold Standard (2021-11-22 00:00:00)  Leucovorin  Injection What is this medication? LEUCOVORIN  (loo koe VOR in) prevents side effects from certain medications, such as methotrexate. It works by increasing folate levels. This helps protect healthy cells in your body. It may also be used to treat anemia caused by low levels of folate. It can also be used with fluorouracil , a type of chemotherapy, to treat colorectal cancer. It works by increasing the effects of fluorouracil  in the body. This medicine may be used for other purposes; ask your health care provider or  pharmacist if you have questions. What should I tell my care team before I take this medication? They need to know if you have any of these conditions: Anemia from low levels of vitamin B12 in the blood An unusual or allergic reaction to leucovorin , folic acid, other medications, foods, dyes, or preservatives Pregnant or trying to get pregnant Breastfeeding How should I use this medication? This medication is injected into a vein or a muscle. It is given by your care team in a hospital or clinic setting. Talk to your care team about the use of this medication in children. Special care may be needed. Overdosage: If you think you have taken too much of this medicine contact a poison control center or emergency room at once. NOTE: This medicine is only for you. Do not share this medicine with others. What if I miss a dose? Keep  appointments for follow-up doses. It is important not to miss your dose. Call your care team if you are unable to keep an appointment. What may interact with this medication? Capecitabine Fluorouracil  Phenobarbital Phenytoin Primidone Trimethoprim ;sulfamethoxazole  This list may not describe all possible interactions. Give your health care provider a list of all the medicines, herbs, non-prescription drugs, or dietary supplements you use. Also tell them if you smoke, drink alcohol, or use illegal drugs. Some items may interact with your medicine. What should I watch for while using this medication? Your condition will be monitored carefully while you are receiving this medication. This medication may increase the side effects of 5-fluorouracil . Tell your care team if you have diarrhea or mouth sores that do not get better or that get worse. What side effects may I notice from receiving this medication? Side effects that you should report to your care team as soon as possible: Allergic reactions--skin rash, itching, hives, swelling of the face, lips, tongue, or  throat This list may not describe all possible side effects. Call your doctor for medical advice about side effects. You may report side effects to FDA at 1-800-FDA-1088. Where should I keep my medication? This medication is given in a hospital or clinic. It will not be stored at home. NOTE: This sheet is a summary. It may not cover all possible information. If you have questions about this medicine, talk to your doctor, pharmacist, or health care provider.  2024 Elsevier/Gold Standard (2021-12-12 00:00:00)  Fluorouracil  Injection What is this medication? FLUOROURACIL  (flure oh YOOR a sil) treats some types of cancer. It works by slowing down the growth of cancer cells. This medicine may be used for other purposes; ask your health care provider or pharmacist if you have questions. COMMON BRAND NAME(S): Adrucil  What should I tell my care team before I take this medication? They need to know if you have any of these conditions: Blood disorders Dihydropyrimidine dehydrogenase (DPD) deficiency Infection, such as chickenpox, cold sores, herpes Kidney disease Liver disease Poor nutrition Recent or ongoing radiation therapy An unusual or allergic reaction to fluorouracil , other medications, foods, dyes, or preservatives If you or your partner are pregnant or trying to get pregnant Breast-feeding How should I use this medication? This medication is injected into a vein. It is administered by your care team in a hospital or clinic setting. Talk to your care team about the use of this medication in children. Special care may be needed. Overdosage: If you think you have taken too much of this medicine contact a poison control center or emergency room at once. NOTE: This medicine is only for you. Do not share this medicine with others. What if I miss a dose? Keep appointments for follow-up doses. It is important not to miss your dose. Call your care team if you are unable to keep an  appointment. What may interact with this medication? Do not take this medication with any of the following: Live virus vaccines This medication may also interact with the following: Medications that treat or prevent blood clots, such as warfarin, enoxaparin , dalteparin This list may not describe all possible interactions. Give your health care provider a list of all the medicines, herbs, non-prescription drugs, or dietary supplements you use. Also tell them if you smoke, drink alcohol, or use illegal drugs. Some items may interact with your medicine. What should I watch for while using this medication? Your condition will be monitored carefully while you are receiving this medication. This medication may make  you feel generally unwell. This is not uncommon as chemotherapy can affect healthy cells as well as cancer cells. Report any side effects. Continue your course of treatment even though you feel ill unless your care team tells you to stop. In some cases, you may be given additional medications to help with side effects. Follow all directions for their use. This medication may increase your risk of getting an infection. Call your care team for advice if you get a fever, chills, sore throat, or other symptoms of a cold or flu. Do not treat yourself. Try to avoid being around people who are sick. This medication may increase your risk to bruise or bleed. Call your care team if you notice any unusual bleeding. Be careful brushing or flossing your teeth or using a toothpick because you may get an infection or bleed more easily. If you have any dental work done, tell your dentist you are receiving this medication. Avoid taking medications that contain aspirin , acetaminophen , ibuprofen, naproxen, or ketoprofen unless instructed by your care team. These medications may hide a fever. Do not treat diarrhea with over the counter products. Contact your care team if you have diarrhea that lasts more than 2  days or if it is severe and watery. This medication can make you more sensitive to the sun. Keep out of the sun. If you cannot avoid being in the sun, wear protective clothing and sunscreen. Do not use sun lamps, tanning beds, or tanning booths. Talk to your care team if you or your partner wish to become pregnant or think you might be pregnant. This medication can cause serious birth defects if taken during pregnancy and for 3 months after the last dose. A reliable form of contraception is recommended while taking this medication and for 3 months after the last dose. Talk to your care team about effective forms of contraception. Do not father a child while taking this medication and for 3 months after the last dose. Use a condom while having sex during this time period. Do not breastfeed while taking this medication. This medication may cause infertility. Talk to your care team if you are concerned about your fertility. What side effects may I notice from receiving this medication? Side effects that you should report to your care team as soon as possible: Allergic reactions--skin rash, itching, hives, swelling of the face, lips, tongue, or throat Heart attack--pain or tightness in the chest, shoulders, arms, or jaw, nausea, shortness of breath, cold or clammy skin, feeling faint or lightheaded Heart failure--shortness of breath, swelling of the ankles, feet, or hands, sudden weight gain, unusual weakness or fatigue Heart rhythm changes--fast or irregular heartbeat, dizziness, feeling faint or lightheaded, chest pain, trouble breathing High ammonia level--unusual weakness or fatigue, confusion, loss of appetite, nausea, vomiting, seizures Infection--fever, chills, cough, sore throat, wounds that don't heal, pain or trouble when passing urine, general feeling of discomfort or being unwell Low red blood cell level--unusual weakness or fatigue, dizziness, headache, trouble breathing Pain, tingling, or  numbness in the hands or feet, muscle weakness, change in vision, confusion or trouble speaking, loss of balance or coordination, trouble walking, seizures Redness, swelling, and blistering of the skin over hands and feet Severe or prolonged diarrhea Unusual bruising or bleeding Side effects that usually do not require medical attention (report to your care team if they continue or are bothersome): Dry skin Headache Increased tears Nausea Pain, redness, or swelling with sores inside the mouth or throat Sensitivity to light Vomiting  This list may not describe all possible side effects. Call your doctor for medical advice about side effects. You may report side effects to FDA at 1-800-FDA-1088. Where should I keep my medication? This medication is given in a hospital or clinic. It will not be stored at home. NOTE: This sheet is a summary. It may not cover all possible information. If you have questions about this medicine, talk to your doctor, pharmacist, or health care provider.  2024 Elsevier/Gold Standard (2021-11-14 00:00:00)

## 2023-11-15 ENCOUNTER — Other Ambulatory Visit: Payer: Self-pay

## 2023-11-15 ENCOUNTER — Inpatient Hospital Stay

## 2023-11-15 VITALS — BP 93/72 | HR 94 | Temp 98.1°F

## 2023-11-15 DIAGNOSIS — C187 Malignant neoplasm of sigmoid colon: Secondary | ICD-10-CM

## 2023-11-15 DIAGNOSIS — Z5112 Encounter for antineoplastic immunotherapy: Secondary | ICD-10-CM | POA: Diagnosis not present

## 2023-11-15 MED ORDER — SODIUM CHLORIDE 0.9% FLUSH
10.0000 mL | INTRAVENOUS | Status: DC | PRN
  Administered 2023-11-15: 10 mL

## 2023-11-15 MED ORDER — HEPARIN SOD (PORK) LOCK FLUSH 100 UNIT/ML IV SOLN
500.0000 [IU] | Freq: Once | INTRAVENOUS | Status: AC | PRN
  Administered 2023-11-15: 500 [IU]

## 2023-11-21 ENCOUNTER — Other Ambulatory Visit: Payer: Self-pay | Admitting: Nurse Practitioner

## 2023-11-21 ENCOUNTER — Telehealth: Payer: Self-pay

## 2023-11-21 DIAGNOSIS — C187 Malignant neoplasm of sigmoid colon: Secondary | ICD-10-CM

## 2023-11-21 MED ORDER — TRIAMCINOLONE ACETONIDE 0.025 % EX OINT: 1.0000 | TOPICAL_OINTMENT | Freq: Two times a day (BID) | CUTANEOUS | 0 refills | Status: DC

## 2023-11-21 NOTE — Telephone Encounter (Signed)
 The patient reports that the facial rash is worsening and that the hydrocortisone cream has not been effective. Could the provider please consider prescribing a steroid cream to help manage the rash?

## 2023-11-23 ENCOUNTER — Other Ambulatory Visit: Payer: Self-pay | Admitting: Oncology

## 2023-11-27 ENCOUNTER — Encounter: Payer: Self-pay | Admitting: Nurse Practitioner

## 2023-11-27 ENCOUNTER — Inpatient Hospital Stay

## 2023-11-27 ENCOUNTER — Inpatient Hospital Stay: Attending: Oncology

## 2023-11-27 ENCOUNTER — Inpatient Hospital Stay (HOSPITAL_BASED_OUTPATIENT_CLINIC_OR_DEPARTMENT_OTHER): Admitting: Nurse Practitioner

## 2023-11-27 VITALS — BP 111/84 | HR 94 | Temp 98.1°F | Resp 18 | Ht 66.0 in | Wt 213.0 lb

## 2023-11-27 DIAGNOSIS — Z5112 Encounter for antineoplastic immunotherapy: Secondary | ICD-10-CM | POA: Diagnosis present

## 2023-11-27 DIAGNOSIS — C187 Malignant neoplasm of sigmoid colon: Secondary | ICD-10-CM | POA: Insufficient documentation

## 2023-11-27 DIAGNOSIS — Z5111 Encounter for antineoplastic chemotherapy: Secondary | ICD-10-CM | POA: Diagnosis present

## 2023-11-27 DIAGNOSIS — C787 Secondary malignant neoplasm of liver and intrahepatic bile duct: Secondary | ICD-10-CM | POA: Insufficient documentation

## 2023-11-27 LAB — CBC WITH DIFFERENTIAL (CANCER CENTER ONLY)
Abs Immature Granulocytes: 0.03 10*3/uL (ref 0.00–0.07)
Basophils Absolute: 0.1 10*3/uL (ref 0.0–0.1)
Basophils Relative: 1 %
Eosinophils Absolute: 0.3 10*3/uL (ref 0.0–0.5)
Eosinophils Relative: 3 %
HCT: 47.3 % (ref 39.0–52.0)
Hemoglobin: 16.1 g/dL (ref 13.0–17.0)
Immature Granulocytes: 0 %
Lymphocytes Relative: 19 %
Lymphs Abs: 1.9 10*3/uL (ref 0.7–4.0)
MCH: 32.7 pg (ref 26.0–34.0)
MCHC: 34 g/dL (ref 30.0–36.0)
MCV: 95.9 fL (ref 80.0–100.0)
Monocytes Absolute: 0.8 10*3/uL (ref 0.1–1.0)
Monocytes Relative: 8 %
Neutro Abs: 6.9 10*3/uL (ref 1.7–7.7)
Neutrophils Relative %: 69 %
Platelet Count: 136 10*3/uL — ABNORMAL LOW (ref 150–400)
RBC: 4.93 MIL/uL (ref 4.22–5.81)
RDW: 14.6 % (ref 11.5–15.5)
WBC Count: 10 10*3/uL (ref 4.0–10.5)
nRBC: 0 % (ref 0.0–0.2)

## 2023-11-27 LAB — CMP (CANCER CENTER ONLY)
ALT: 34 U/L (ref 0–44)
AST: 35 U/L (ref 15–41)
Albumin: 3.9 g/dL (ref 3.5–5.0)
Alkaline Phosphatase: 128 U/L — ABNORMAL HIGH (ref 38–126)
Anion gap: 12 (ref 5–15)
BUN: 16 mg/dL (ref 6–20)
CO2: 26 mmol/L (ref 22–32)
Calcium: 9.1 mg/dL (ref 8.9–10.3)
Chloride: 102 mmol/L (ref 98–111)
Creatinine: 0.79 mg/dL (ref 0.61–1.24)
GFR, Estimated: >60 mL/min (ref >60)
Glucose, Bld: 135 mg/dL — ABNORMAL HIGH (ref 70–99)
Potassium: 3.5 mmol/L (ref 3.5–5.1)
Sodium: 140 mmol/L (ref 135–145)
Total Bilirubin: 0.7 mg/dL (ref 0.0–1.2)
Total Protein: 7.1 g/dL (ref 6.5–8.1)

## 2023-11-27 LAB — MAGNESIUM: Magnesium: 1.8 mg/dL (ref 1.7–2.4)

## 2023-11-27 LAB — CEA (ACCESS): CEA (CHCC): 4.55 ng/mL (ref 0.00–5.00)

## 2023-11-27 MED ORDER — SODIUM CHLORIDE 0.9 % IV SOLN
6.0000 mg/kg | Freq: Once | INTRAVENOUS | Status: AC
  Administered 2023-11-27: 600 mg via INTRAVENOUS
  Filled 2023-11-27: qty 20

## 2023-11-27 MED ORDER — SODIUM CHLORIDE 0.9 % IV SOLN: Freq: Once | INTRAVENOUS | Status: AC

## 2023-11-27 MED ORDER — SODIUM CHLORIDE 0.9% FLUSH
10.0000 mL | INTRAVENOUS | Status: DC | PRN
Start: 2023-11-27 — End: 2023-11-27

## 2023-11-27 MED ORDER — LEUCOVORIN CALCIUM INJECTION 350 MG
400.0000 mg/m2 | Freq: Once | INTRAVENOUS | Status: AC
  Administered 2023-11-27: 836 mg via INTRAVENOUS
  Filled 2023-11-27: qty 41.8

## 2023-11-27 MED ORDER — HEPARIN SOD (PORK) LOCK FLUSH 100 UNIT/ML IV SOLN: 500.0000 [IU] | Freq: Once | INTRAVENOUS | Status: DC | PRN

## 2023-11-27 MED ORDER — SODIUM CHLORIDE 0.9 % IV SOLN
2400.0000 mg/m2 | INTRAVENOUS | Status: DC
  Administered 2023-11-27: 5000 mg via INTRAVENOUS
  Filled 2023-11-27: qty 100

## 2023-11-27 MED ORDER — FLUOROURACIL CHEMO INJECTION 2.5 GM/50ML
400.0000 mg/m2 | Freq: Once | INTRAVENOUS | Status: AC
  Administered 2023-11-27: 850 mg via INTRAVENOUS
  Filled 2023-11-27: qty 17

## 2023-11-27 NOTE — Progress Notes (Signed)
 Patient seen by Lonna Cobb NP today  Vitals are within treatment parameters:Yes   Labs are within treatment parameters: Yes   Treatment plan has been signed: Yes   Per physician team, Patient is ready for treatment and there are NO modifications to the treatment plan.

## 2023-11-27 NOTE — Progress Notes (Signed)
 [facility] OFFICE PROGRESS NOTE   Diagnosis: Colon cancer  INTERVAL HISTORY:   Daniel [name] returns as scheduled.  He completed another cycle of 5-FU/Panitumumab  11/13/2023.  He denies nausea/vomiting.  No mouth sores.  No diarrhea.  He notes a crack in the skin on the right thumb near the nailbed.  Similar redness and drainage right lateral great toe.  The rash increased on his face, mainly cheeks, following the last treatment.  He wonders if wind, sun and smoke exposure exacerbated the rash.  He is applying a moisturizer.  We called in triamcinolone  cream twice a day.  He has noticed significant improvement.  Objective:  Vital signs in last 24 hours:  Blood pressure 111/84, pulse 94, temperature 98.1 F (36.7 C), temperature source Temporal, resp. rate 18, height 5\' 6"  (1.676 m), weight 213 lb (96.6 kg), SpO2 100%.    HEENT: No thrush or ulcers. Resp: Lungs clear bilaterally. Cardio: Regular rate and rhythm. GI: No hepatosplenomegaly.  Nontender. Vascular: No leg edema. Skin: Faint acne type rash malar regions and nose.  Acne type rash scattered over the trunk.  Linear break in the skin right thumb near the nailbed.  Right great toe with mild erythema and drainage laterally. Port-A-Cath without erythema.  Lab Results:  Lab Results  Component Value Date   WBC 10.0 11/27/2023   HGB 16.1 11/27/2023   HCT 47.3 11/27/2023   MCV 95.9 11/27/2023   PLT 136 (L) 11/27/2023   NEUTROABS 6.9 11/27/2023    Imaging:  No results found.  Medications: I have reviewed the patient's current medications.  Assessment/Plan: Sigmoid colon mass, multiple liver lesions Ultrasound abdomen 01/08/2023-multifocal hyperechoic liver lesions, largest measuring 8.7 cm in the right hepatic lobe CT abdomen/pelvis 01/08/2023-multiple by lobar hypoattenuating hepatic lesions, masslike thickening of the sigmoid colon with abnormal spiculated pericolonic soft tissue.  IMA lymphadenopathy.   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 Colonoscopy 01/29/2023-malignant tumor in the sigmoid colon (invasive moderately differentiated adenocarcinoma), four 6-9 mm polyps in the rectum, ascending colon and cecum, one 14 mm polyp in the sigmoid colon (within the rectal polyp biopsies there are 2 similar-appearing fragments showing invasive tumor with associated adenomatous change which is felt to be carryover from the sigmoid mass biopsies, the rectal polyp biopsies also show multiple fragments of tubular adenoma without high-grade dysplasia) Cycle 1 FOLFOX 01/30/2023 Cycle 2 FOLFOX 02/12/2023 Cycle 3 FOLFOX plus Panitumumab  02/27/2023 Cycle 4 FOLFOX plus Panitumumab  03/13/2023 Cycle 5 FOLFOX plus panitumumab  03/27/2023 Cycle 6 FOLFOX plus Panitumumab  04/10/2023 CTs 04/19/2023-decrease size of hepatic metastases, no new lesions, decreased right upper quadrant and retroperitoneal lymph nodes, sigmoid colonic wall thickening no longer seen with a decrease in sigmoid mesenteric lymph nodes Cycle 7 FOLFOX plus panitumumab  04/24/2023, oxaliplatin  dose reduced secondary to thrombocytopenia and neuropathy Cycle 8 FOLFOX plus Panitumumab  05/08/2023 Cycle 9 FOLFOX plus Panitumumab  05/22/2023 Cycle 10 FOLFOX plus Panitumumab  06/05/2023 Cycle 11 FOLFOX plus panitumumab  06/19/2023 Cycle 12 FOLFOX plus Panitumumab  07/03/2023, oxaliplatin  held due to thrombocytopenia CTs 07/11/2023: Decrease in hepatic metastases, no enlarged abdominal or pelvic nodes, no metastatic disease to the chest Cycle 13 of 5-FU/panitumumab  07/23/2023 Cycle 14 5-FU/Panitumumab  08/07/2023 Cycle 15 5-FU/panitumumab  08/21/2023 Cycle 16 5-FU/Panitumumab  09/04/2023 Cycle 17 5-FU/panitumumab  09/18/2023 Cycle  18 5-FU/Panitumumab  10/02/2023 Cycle 19 5-FU/Panitumumab  10/16/2023 CTs 10/24/2023: Decrease in hepatic metastases, no evidence of disease progression Cycle  20 5-FU/panitumumab  10/30/2023 Cycle 21 5-FU/Panitumumab  11/13/2023 Cycle 22 5-FU/Panitumumab  11/27/2023 Diabetes Cardiomyopathy Hypertension Hypercholesterolemia Weight loss secondary to Mounjaro, metastatic carcinoma? 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 appears stable.  He continues 5-FU/Panitumumab  on a 2-week schedule.  Overall he is tolerating well.  There is no clinical evidence of disease progression.  Plan to proceed with 5-FU/Panitumumab  today as scheduled.  He noted an increase in the rash on his face following the last treatment.  The rash is likely due to Panitumumab , possibly 5-fluorouracil  and environmental exposure.  The rash has improved significantly since beginning triamcinolone  twice daily.  He will now utilize as needed.  He will continue other supportive measures including moisturizer and sunscreen.  He has mild paronychia and will continue Neosporin/Band-Aids.  CBC and chemistry panel reviewed.  Labs adequate to proceed with treatment.  He will return for follow-up and treatment in 2 weeks.  We are available to see him sooner if needed.  Diana Forster ANP/GNP-BC   11/27/2023  8:34 AM

## 2023-11-28 ENCOUNTER — Other Ambulatory Visit: Payer: Self-pay

## 2023-11-29 ENCOUNTER — Inpatient Hospital Stay

## 2023-11-29 VITALS — BP 115/72 | HR 91 | Temp 97.9°F | Resp 18

## 2023-11-29 DIAGNOSIS — Z5112 Encounter for antineoplastic immunotherapy: Secondary | ICD-10-CM | POA: Diagnosis not present

## 2023-11-29 DIAGNOSIS — C187 Malignant neoplasm of sigmoid colon: Secondary | ICD-10-CM

## 2023-11-29 MED ORDER — HEPARIN SOD (PORK) LOCK FLUSH 100 UNIT/ML IV SOLN
500.0000 [IU] | Freq: Once | INTRAVENOUS | Status: AC | PRN
  Administered 2023-11-29: 500 [IU]

## 2023-11-29 MED ORDER — SODIUM CHLORIDE 0.9% FLUSH
10.0000 mL | INTRAVENOUS | Status: DC | PRN
  Administered 2023-11-29: 10 mL

## 2023-12-02 ENCOUNTER — Encounter: Payer: Self-pay | Admitting: Oncology

## 2023-12-04 ENCOUNTER — Encounter: Payer: Self-pay | Admitting: *Deleted

## 2023-12-04 ENCOUNTER — Other Ambulatory Visit: Payer: Self-pay | Admitting: Oncology

## 2023-12-04 DIAGNOSIS — C187 Malignant neoplasm of sigmoid colon: Secondary | ICD-10-CM

## 2023-12-04 NOTE — Progress Notes (Signed)
 Scheduling message sent to move 6/2 appointments out 1-2 weeks due to provider not being in office.

## 2023-12-04 NOTE — Progress Notes (Signed)
 Script sent 4/21 for #180 KCl 20 meq and it was picked up per [facility]. Received another request today for #90. This was declined.

## 2023-12-08 ENCOUNTER — Other Ambulatory Visit: Payer: Self-pay | Admitting: Oncology

## 2023-12-08 DIAGNOSIS — C187 Malignant neoplasm of sigmoid colon: Secondary | ICD-10-CM

## 2023-12-11 ENCOUNTER — Inpatient Hospital Stay

## 2023-12-11 ENCOUNTER — Inpatient Hospital Stay (HOSPITAL_BASED_OUTPATIENT_CLINIC_OR_DEPARTMENT_OTHER): Admitting: Oncology

## 2023-12-11 VITALS — BP 97/74 | HR 92 | Temp 98.1°F | Resp 18 | Ht 66.0 in | Wt 213.3 lb

## 2023-12-11 DIAGNOSIS — Z95828 Presence of other vascular implants and grafts: Secondary | ICD-10-CM

## 2023-12-11 DIAGNOSIS — C187 Malignant neoplasm of sigmoid colon: Secondary | ICD-10-CM | POA: Diagnosis not present

## 2023-12-11 DIAGNOSIS — Z5112 Encounter for antineoplastic immunotherapy: Secondary | ICD-10-CM | POA: Diagnosis not present

## 2023-12-11 LAB — CMP (CANCER CENTER ONLY)
ALT: 31 U/L (ref 0–44)
AST: 32 U/L (ref 15–41)
Albumin: 4 g/dL (ref 3.5–5.0)
Alkaline Phosphatase: 126 U/L (ref 38–126)
Anion gap: 13 (ref 5–15)
BUN: 15 mg/dL (ref 6–20)
CO2: 26 mmol/L (ref 22–32)
Calcium: 9 mg/dL (ref 8.9–10.3)
Chloride: 103 mmol/L (ref 98–111)
Creatinine: 0.82 mg/dL (ref 0.61–1.24)
GFR, Estimated: >60 mL/min (ref >60)
Glucose, Bld: 133 mg/dL — ABNORMAL HIGH (ref 70–99)
Potassium: 3.5 mmol/L (ref 3.5–5.1)
Sodium: 142 mmol/L (ref 135–145)
Total Bilirubin: 0.6 mg/dL (ref 0.0–1.2)
Total Protein: 7.4 g/dL (ref 6.5–8.1)

## 2023-12-11 LAB — CBC WITH DIFFERENTIAL (CANCER CENTER ONLY)
Abs Immature Granulocytes: 0.03 10*3/uL (ref 0.00–0.07)
Basophils Absolute: 0.1 10*3/uL (ref 0.0–0.1)
Basophils Relative: 1 %
Eosinophils Absolute: 0.4 10*3/uL (ref 0.0–0.5)
Eosinophils Relative: 4 %
HCT: 47.9 % (ref 39.0–52.0)
Hemoglobin: 16.3 g/dL (ref 13.0–17.0)
Immature Granulocytes: 0 %
Lymphocytes Relative: 22 %
Lymphs Abs: 2.2 10*3/uL (ref 0.7–4.0)
MCH: 32.7 pg (ref 26.0–34.0)
MCHC: 34 g/dL (ref 30.0–36.0)
MCV: 96 fL (ref 80.0–100.0)
Monocytes Absolute: 0.8 10*3/uL (ref 0.1–1.0)
Monocytes Relative: 8 %
Neutro Abs: 6.3 10*3/uL (ref 1.7–7.7)
Neutrophils Relative %: 65 %
Platelet Count: 119 10*3/uL — ABNORMAL LOW (ref 150–400)
RBC: 4.99 MIL/uL (ref 4.22–5.81)
RDW: 14.6 % (ref 11.5–15.5)
WBC Count: 9.8 10*3/uL (ref 4.0–10.5)
nRBC: 0 % (ref 0.0–0.2)

## 2023-12-11 LAB — CEA (ACCESS): CEA (CHCC): 4.81 ng/mL (ref 0.00–5.00)

## 2023-12-11 LAB — MAGNESIUM: Magnesium: 1.8 mg/dL (ref 1.7–2.4)

## 2023-12-11 MED ORDER — SODIUM CHLORIDE 0.9 % IV SOLN
2400.0000 mg/m2 | INTRAVENOUS | Status: DC
  Administered 2023-12-11: 5000 mg via INTRAVENOUS
  Filled 2023-12-11: qty 100

## 2023-12-11 MED ORDER — SODIUM CHLORIDE 0.9% FLUSH
10.0000 mL | Freq: Once | INTRAVENOUS | Status: AC
  Administered 2023-12-11: 10 mL via INTRAVENOUS

## 2023-12-11 MED ORDER — FLUOROURACIL CHEMO INJECTION 2.5 GM/50ML
400.0000 mg/m2 | Freq: Once | INTRAVENOUS | Status: AC
  Administered 2023-12-11: 850 mg via INTRAVENOUS
  Filled 2023-12-11: qty 17

## 2023-12-11 MED ORDER — SODIUM CHLORIDE 0.9 % IV SOLN
6.0000 mg/kg | Freq: Once | INTRAVENOUS | Status: AC
  Administered 2023-12-11: 600 mg via INTRAVENOUS
  Filled 2023-12-11: qty 20

## 2023-12-11 MED ORDER — DEXTROSE 5 % IV SOLN
Freq: Once | INTRAVENOUS | Status: DC
Start: 2023-12-11 — End: 2023-12-11

## 2023-12-11 MED ORDER — SODIUM CHLORIDE 0.9 % IV SOLN: Freq: Once | INTRAVENOUS | Status: AC

## 2023-12-11 MED ORDER — SODIUM CHLORIDE 0.9 % IV SOLN
400.0000 mg/m2 | Freq: Once | INTRAVENOUS | Status: AC
  Administered 2023-12-11: 836 mg via INTRAVENOUS
  Filled 2023-12-11: qty 41.8

## 2023-12-11 NOTE — Patient Instructions (Signed)

## 2023-12-11 NOTE — Progress Notes (Signed)
  Cancer Center OFFICE PROGRESS NOTE   Diagnosis: Colon cancer  INTERVAL HISTORY:   Daniel [name] completed another cycle of 5-FU/panitumumab  on 11/27/2023.  He reports 1 ulcer at the upper gumline that has resolved.  He has several areas of paronychia.  He is using Neosporin.  He feels well.  Objective:  Vital signs in last 24 hours:  Blood pressure 97/74, pulse 92, temperature 98.1 F (36.7 C), temperature source Temporal, resp. rate 18, height 5\' 6"  (1.676 m), weight 213 lb 4.8 oz (96.8 kg), SpO2 100%.    HEENT: No thrush or ulcers Resp: Lungs clear bilaterally Cardio: Regular rate and rhythm GI: No hepatosplenomegaly, nontender Vascular: No leg edema  Skin: Few superficial linear ulcers at the distal fingers, linear ulcers at the bilateral heel, inflamed paronychia at the right thumb and right great toe, acne type rash over the trunk-drying in areas  Portacath/PICC-without erythema  Lab Results:  Lab Results  Component Value Date   WBC 9.8 12/11/2023   HGB 16.3 12/11/2023   HCT 47.9 12/11/2023   MCV 96.0 12/11/2023   PLT 119 (L) 12/11/2023   NEUTROABS 6.3 12/11/2023    CMP  Lab Results  Component Value Date   NA 142 12/11/2023   K 3.5 12/11/2023   CL 103 12/11/2023   CO2 26 12/11/2023   GLUCOSE 133 (H) 12/11/2023   BUN 15 12/11/2023   CREATININE 0.82 12/11/2023   CALCIUM  9.0 12/11/2023   PROT 7.4 12/11/2023   ALBUMIN 4.0 12/11/2023   AST 32 12/11/2023   ALT 31 12/11/2023   ALKPHOS 126 12/11/2023   BILITOT 0.6 12/11/2023   GFRNONAA >60 12/11/2023   GFRAA >60 07/23/2016    Lab Results  Component Value Date   CEA 4.81 12/11/2023    Medications: I have reviewed the patient's current medications.   Assessment/Plan:  Sigmoid colon mass, multiple liver lesions Ultrasound abdomen 01/08/2023-multifocal hyperechoic liver lesions, largest measuring 8.7 cm in the right hepatic lobe CT abdomen/pelvis 01/08/2023-multiple by lobar hypoattenuating  hepatic lesions, masslike thickening of the sigmoid colon with abnormal spiculated pericolonic soft tissue.  IMA lymphadenopathy.  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 Colonoscopy 01/29/2023-malignant tumor in the sigmoid colon (invasive moderately differentiated adenocarcinoma), four 6-9 mm polyps in the rectum, ascending colon and cecum, one 14 mm polyp in the sigmoid colon (within the rectal polyp biopsies there are 2 similar-appearing fragments showing invasive tumor with associated adenomatous change which is felt to be carryover from the sigmoid mass biopsies, the rectal polyp biopsies also show multiple fragments of tubular adenoma without high-grade dysplasia) Cycle 1 FOLFOX 01/30/2023 Cycle 2 FOLFOX 02/12/2023 Cycle 3 FOLFOX plus Panitumumab  02/27/2023 Cycle 4 FOLFOX plus Panitumumab  03/13/2023 Cycle 5 FOLFOX plus panitumumab  03/27/2023 Cycle 6 FOLFOX plus Panitumumab  04/10/2023 CTs 04/19/2023-decrease size of hepatic metastases, no new lesions, decreased right upper quadrant and retroperitoneal lymph nodes, sigmoid colonic wall thickening no longer seen with a decrease in sigmoid mesenteric lymph nodes Cycle 7 FOLFOX plus panitumumab  04/24/2023, oxaliplatin  dose reduced secondary to thrombocytopenia and neuropathy Cycle 8 FOLFOX plus Panitumumab  05/08/2023 Cycle 9 FOLFOX plus Panitumumab  05/22/2023 Cycle 10 FOLFOX plus Panitumumab  06/05/2023 Cycle 11 FOLFOX plus panitumumab  06/19/2023 Cycle 12 FOLFOX plus Panitumumab  07/03/2023, oxaliplatin  held due to thrombocytopenia CTs 07/11/2023: Decrease in hepatic metastases, no enlarged abdominal or pelvic nodes, no metastatic disease to the chest  Cycle 13 of 5-FU/panitumumab  07/23/2023 Cycle 14  5-FU/Panitumumab  08/07/2023 Cycle 15 5-FU/panitumumab  08/21/2023 Cycle 16 5-FU/Panitumumab  09/04/2023 Cycle 17 5-FU/panitumumab  09/18/2023 Cycle 18 5-FU/Panitumumab  10/02/2023 Cycle 19 5-FU/Panitumumab  10/16/2023 CTs 10/24/2023: Decrease in hepatic metastases, no evidence of disease progression Cycle 20 5-FU/panitumumab  10/30/2023 Cycle 21 5-FU/Panitumumab  11/13/2023 Cycle 22 5-FU/Panitumumab  11/27/2023 Cycle 23 5-FU/panitumumab  12/11/2023 Diabetes Cardiomyopathy Hypertension Hypercholesterolemia Weight loss secondary to Mounjaro, metastatic carcinoma? 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  He is tolerating the 5-FU/panitumumab  well aside from skin/nail toxicity.  He will continue Neosporin and salt water soaks to the areas of paronychia.  He will use topical steroids as needed for the skin rash.  He will call for progression of the paronychia.  He will complete another cycle of 5-FU/panitumumab  today.  Daniel [name] will return for an office visit and chemotherapy in 2 weeks.  We will plan for a restaging CT at a 68-month interval.  We will follow-up on the CEA from today.  Coni Deep, MD  12/11/2023  10:01 AM

## 2023-12-11 NOTE — Progress Notes (Signed)
 Patient seen by Dr. Thornton Papas today  Vitals are within treatment parameters:Yes   Labs are within treatment parameters: Yes   Treatment plan has been signed: Yes   Per physician team, Patient is ready for treatment and there are NO modifications to the treatment plan.

## 2023-12-13 ENCOUNTER — Inpatient Hospital Stay

## 2023-12-13 VITALS — BP 91/73 | HR 98 | Temp 97.8°F | Resp 16

## 2023-12-13 DIAGNOSIS — C187 Malignant neoplasm of sigmoid colon: Secondary | ICD-10-CM

## 2023-12-13 DIAGNOSIS — Z5112 Encounter for antineoplastic immunotherapy: Secondary | ICD-10-CM | POA: Diagnosis not present

## 2023-12-13 MED ORDER — HEPARIN SOD (PORK) LOCK FLUSH 100 UNIT/ML IV SOLN
500.0000 [IU] | Freq: Once | INTRAVENOUS | Status: AC | PRN
  Administered 2023-12-13: 500 [IU]

## 2023-12-13 MED ORDER — SODIUM CHLORIDE 0.9% FLUSH
10.0000 mL | INTRAVENOUS | Status: DC | PRN
  Administered 2023-12-13: 10 mL

## 2023-12-13 NOTE — Progress Notes (Signed)
 Patient presents today for pump stop. Pump removed without issue. Patients port flushed without difficulty.  Good blood return noted with no bruising or swelling noted at site.  Band aid applied.  VSS with discharge and left in satisfactory condition with no s/s of distress noted.

## 2023-12-13 NOTE — Patient Instructions (Signed)
 [facility] - A DEPT OF [facility]  Discharge Instructions: Thank you for choosing [facility] to provide your oncology and hematology care.   If you have a lab appointment with the Cancer Center, please go directly to the Cancer Center and check in at the registration area.   Wear comfortable clothing and clothing appropriate for easy access to any Portacath or PICC line.   We strive to give you quality time with your provider. You may need to reschedule your appointment if you arrive late (15 or more minutes).  Arriving late affects you and other patients whose appointments are after yours.  Also, if you miss three or more appointments without notifying the office, you may be dismissed from the clinic at the provider's discretion.      For prescription refill requests, have your pharmacy contact our office and allow 72 hours for refills to be completed.    Today you received the following pump stop.    To help prevent nausea and vomiting after your treatment, we encourage you to take your nausea medication as directed.  BELOW ARE SYMPTOMS THAT SHOULD BE REPORTED IMMEDIATELY: *FEVER GREATER THAN 100.4 F (38 C) OR HIGHER *CHILLS OR SWEATING *NAUSEA AND VOMITING THAT IS NOT CONTROLLED WITH YOUR NAUSEA MEDICATION *UNUSUAL SHORTNESS OF BREATH *UNUSUAL BRUISING OR BLEEDING *URINARY PROBLEMS (pain or burning when urinating, or frequent urination) *BOWEL PROBLEMS (unusual diarrhea, constipation, pain near the anus) TENDERNESS IN MOUTH AND THROAT WITH OR WITHOUT PRESENCE OF ULCERS (sore throat, sores in mouth, or a toothache) UNUSUAL RASH, SWELLING OR PAIN  UNUSUAL VAGINAL DISCHARGE OR ITCHING   Items with * indicate a potential emergency and should be followed up as soon as possible or go to the Emergency Department if any problems should occur.  Please show the CHEMOTHERAPY ALERT CARD or IMMUNOTHERAPY ALERT CARD at check-in to the Emergency  Department and triage nurse.  Should you have questions after your visit or need to cancel or reschedule your appointment, please contact [facility]  Dept: [phone]  and follow the prompts.  Office hours are 8:00 a.m. to 4:30 p.m. Monday - Friday. Please note that voicemails left after 4:00 p.m. may not be returned until the following business day.  We are closed weekends and major holidays. You have access to a nurse at all times for urgent questions. Please call the main number to the clinic Dept: [phone] and follow the prompts.   For any non-urgent questions, you may also contact your provider using MyChart. We now offer e-Visits for anyone 72 and older to request care online for non-urgent symptoms. For details visit [email or web address].   Also download the MyChart app! Go to the app store, search "MyChart", open the app, select [place], and log in with your MyChart username and password.

## 2023-12-16 ENCOUNTER — Other Ambulatory Visit: Payer: Self-pay | Admitting: Oncology

## 2023-12-16 DIAGNOSIS — C187 Malignant neoplasm of sigmoid colon: Secondary | ICD-10-CM

## 2023-12-17 ENCOUNTER — Telehealth: Payer: Self-pay | Admitting: Oncology

## 2023-12-17 ENCOUNTER — Encounter: Payer: Self-pay | Admitting: Oncology

## 2023-12-18 ENCOUNTER — Other Ambulatory Visit: Payer: Self-pay

## 2023-12-25 ENCOUNTER — Inpatient Hospital Stay

## 2023-12-25 ENCOUNTER — Inpatient Hospital Stay (HOSPITAL_BASED_OUTPATIENT_CLINIC_OR_DEPARTMENT_OTHER): Admitting: Nurse Practitioner

## 2023-12-25 ENCOUNTER — Inpatient Hospital Stay: Attending: Oncology

## 2023-12-25 ENCOUNTER — Encounter: Payer: Self-pay | Admitting: Nurse Practitioner

## 2023-12-25 VITALS — BP 97/78 | HR 98 | Temp 98.2°F | Resp 18 | Ht 66.0 in | Wt 213.5 lb

## 2023-12-25 DIAGNOSIS — Z5111 Encounter for antineoplastic chemotherapy: Secondary | ICD-10-CM | POA: Diagnosis present

## 2023-12-25 DIAGNOSIS — C187 Malignant neoplasm of sigmoid colon: Secondary | ICD-10-CM | POA: Insufficient documentation

## 2023-12-25 DIAGNOSIS — C787 Secondary malignant neoplasm of liver and intrahepatic bile duct: Secondary | ICD-10-CM | POA: Diagnosis not present

## 2023-12-25 DIAGNOSIS — Z5112 Encounter for antineoplastic immunotherapy: Secondary | ICD-10-CM | POA: Insufficient documentation

## 2023-12-25 LAB — CBC WITH DIFFERENTIAL (CANCER CENTER ONLY)
Abs Immature Granulocytes: 0.02 10*3/uL (ref 0.00–0.07)
Basophils Absolute: 0.1 10*3/uL (ref 0.0–0.1)
Basophils Relative: 1 %
Eosinophils Absolute: 0.3 10*3/uL (ref 0.0–0.5)
Eosinophils Relative: 3 %
HCT: 47.9 % (ref 39.0–52.0)
Hemoglobin: 16.2 g/dL (ref 13.0–17.0)
Immature Granulocytes: 0 %
Lymphocytes Relative: 19 %
Lymphs Abs: 1.7 10*3/uL (ref 0.7–4.0)
MCH: 32.5 pg (ref 26.0–34.0)
MCHC: 33.8 g/dL (ref 30.0–36.0)
MCV: 96 fL (ref 80.0–100.0)
Monocytes Absolute: 0.7 10*3/uL (ref 0.1–1.0)
Monocytes Relative: 8 %
Neutro Abs: 6.6 10*3/uL (ref 1.7–7.7)
Neutrophils Relative %: 69 %
Platelet Count: 114 10*3/uL — ABNORMAL LOW (ref 150–400)
RBC: 4.99 MIL/uL (ref 4.22–5.81)
RDW: 15.1 % (ref 11.5–15.5)
WBC Count: 9.4 10*3/uL (ref 4.0–10.5)
nRBC: 0 % (ref 0.0–0.2)

## 2023-12-25 LAB — CEA (ACCESS): CEA (CHCC): 4.6 ng/mL (ref 0.00–5.00)

## 2023-12-25 LAB — CMP (CANCER CENTER ONLY)
ALT: 31 U/L (ref 0–44)
AST: 32 U/L (ref 15–41)
Albumin: 3.9 g/dL (ref 3.5–5.0)
Alkaline Phosphatase: 118 U/L (ref 38–126)
Anion gap: 13 (ref 5–15)
BUN: 12 mg/dL (ref 6–20)
CO2: 25 mmol/L (ref 22–32)
Calcium: 9.4 mg/dL (ref 8.9–10.3)
Chloride: 102 mmol/L (ref 98–111)
Creatinine: 0.81 mg/dL (ref 0.61–1.24)
GFR, Estimated: >60 mL/min (ref >60)
Glucose, Bld: 130 mg/dL — ABNORMAL HIGH (ref 70–99)
Potassium: 3.5 mmol/L (ref 3.5–5.1)
Sodium: 140 mmol/L (ref 135–145)
Total Bilirubin: 0.7 mg/dL (ref 0.0–1.2)
Total Protein: 7.2 g/dL (ref 6.5–8.1)

## 2023-12-25 LAB — MAGNESIUM: Magnesium: 1.8 mg/dL (ref 1.7–2.4)

## 2023-12-25 MED ORDER — SODIUM CHLORIDE 0.9% FLUSH
10.0000 mL | INTRAVENOUS | Status: DC | PRN
  Administered 2023-12-25: 10 mL

## 2023-12-25 MED ORDER — FLUOROURACIL CHEMO INJECTION 2.5 GM/50ML
400.0000 mg/m2 | Freq: Once | INTRAVENOUS | Status: AC
  Administered 2023-12-25: 850 mg via INTRAVENOUS
  Filled 2023-12-25: qty 17

## 2023-12-25 MED ORDER — ALPRAZOLAM 0.5 MG PO TABS: 0.2500 mg | ORAL_TABLET | Freq: Every evening | ORAL | 0 refills | Status: AC | PRN

## 2023-12-25 MED ORDER — SODIUM CHLORIDE 0.9 % IV SOLN: Freq: Once | INTRAVENOUS | Status: AC

## 2023-12-25 MED ORDER — SODIUM CHLORIDE 0.9 % IV SOLN
6.0000 mg/kg | Freq: Once | INTRAVENOUS | Status: AC
  Administered 2023-12-25: 600 mg via INTRAVENOUS
  Filled 2023-12-25: qty 20

## 2023-12-25 MED ORDER — SODIUM CHLORIDE 0.9 % IV SOLN
2400.0000 mg/m2 | INTRAVENOUS | Status: DC
  Administered 2023-12-25: 5000 mg via INTRAVENOUS
  Filled 2023-12-25: qty 100

## 2023-12-25 MED ORDER — LEUCOVORIN CALCIUM INJECTION 350 MG
400.0000 mg/m2 | Freq: Once | INTRAVENOUS | Status: AC
  Administered 2023-12-25: 836 mg via INTRAVENOUS
  Filled 2023-12-25: qty 41.8

## 2023-12-25 NOTE — Progress Notes (Signed)
 [name] OFFICE PROGRESS NOTE   Diagnosis: Colon cancer  INTERVAL HISTORY:   Daniel [name] returns as scheduled.  He completed another cycle of 5-FU/Panitumumab  12/11/2023.  He denies nausea/vomiting.  No mouth sores.  No diarrhea.  Skin rash is stable.  Paronychia better.  Mild numbness in the fingertips, overall improved.  His brother died unexpectedly last week.  Since then he has been having trouble falling asleep and would like a sleep aid.  Objective:  Vital signs in last 24 hours:  Blood pressure 97/78, pulse 98, temperature 98.2 F (36.8 C), temperature source Temporal, resp. rate 18, height 5\' 6"  (1.676 m), weight 213 lb 8 oz (96.8 kg), SpO2 100%.    HEENT: No thrush or ulcers. Resp: Lungs clear bilaterally. Cardio: Regular rate and rhythm. GI: Abdomen soft and nontender.  No hepatosplenomegaly. Vascular: No leg edema. Skin: Acne type rash over the face and trunk.  Area of paronychia medial right great toe has a chronic appearance. Port-A-Cath without erythema.  Lab Results:  Lab Results  Component Value Date   WBC 9.4 12/25/2023   HGB 16.2 12/25/2023   HCT 47.9 12/25/2023   MCV 96.0 12/25/2023   PLT 114 (L) 12/25/2023   NEUTROABS 6.6 12/25/2023    Imaging:  No results found.  Medications: I have reviewed the patient's current medications.  Assessment/Plan: Sigmoid colon mass, multiple liver lesions Ultrasound abdomen 01/08/2023-multifocal hyperechoic liver lesions, largest measuring 8.7 cm in the right hepatic lobe CT abdomen/pelvis 01/08/2023-multiple by lobar hypoattenuating hepatic lesions, masslike thickening of the sigmoid colon with abnormal spiculated pericolonic soft tissue.  IMA lymphadenopathy.  Subcentimeter pancreatic hypodensities without pancreatic duct dilation Biopsy liver lesion 01/22/2023-metastatic adenocarcinoma; foundation 1 microsatellite stable, tumor mutation burden 4, ERBB2 amplification, K-ras wild-type, NRAS wild-type;  preserved expression of the major MMR proteins. Colonoscopy 01/29/2023-malignant tumor in the sigmoid colon (invasive moderately differentiated adenocarcinoma), four 6-9 mm polyps in the rectum, ascending colon and cecum, one 14 mm polyp in the sigmoid colon (within the rectal polyp biopsies there are 2 similar-appearing fragments showing invasive tumor with associated adenomatous change which is felt to be carryover from the sigmoid mass biopsies, the rectal polyp biopsies also show multiple fragments of tubular adenoma without high-grade dysplasia) Cycle 1 FOLFOX 01/30/2023 Cycle 2 FOLFOX 02/12/2023 Cycle 3 FOLFOX plus Panitumumab  02/27/2023 Cycle 4 FOLFOX plus Panitumumab  03/13/2023 Cycle 5 FOLFOX plus panitumumab  03/27/2023 Cycle 6 FOLFOX plus Panitumumab  04/10/2023 CTs 04/19/2023-decrease size of hepatic metastases, no new lesions, decreased right upper quadrant and retroperitoneal lymph nodes, sigmoid colonic wall thickening no longer seen with a decrease in sigmoid mesenteric lymph nodes Cycle 7 FOLFOX plus panitumumab  04/24/2023, oxaliplatin  dose reduced secondary to thrombocytopenia and neuropathy Cycle 8 FOLFOX plus Panitumumab  05/08/2023 Cycle 9 FOLFOX plus Panitumumab  05/22/2023 Cycle 10 FOLFOX plus Panitumumab  06/05/2023 Cycle 11 FOLFOX plus panitumumab  06/19/2023 Cycle 12 FOLFOX plus Panitumumab  07/03/2023, oxaliplatin  held due to thrombocytopenia CTs 07/11/2023: Decrease in hepatic metastases, no enlarged abdominal or pelvic nodes, no metastatic disease to the chest Cycle 13 of 5-FU/panitumumab  07/23/2023 Cycle 14 5-FU/Panitumumab  08/07/2023 Cycle 15 5-FU/panitumumab  08/21/2023 Cycle 16 5-FU/Panitumumab  09/04/2023 Cycle 17 5-FU/panitumumab  09/18/2023 Cycle 18 5-FU/Panitumumab  10/02/2023 Cycle 19 5-FU/Panitumumab  10/16/2023 CTs 10/24/2023: Decrease in hepatic metastases, no evidence of disease progression Cycle 20 5-FU/panitumumab  10/30/2023 Cycle 21 5-FU/Panitumumab  11/13/2023 Cycle 22  5-FU/Panitumumab  11/27/2023 Cycle 23 5-FU/panitumumab  12/11/2023 Cycle 24 5-FU/Panitumumab  12/25/2023 Diabetes Cardiomyopathy Hypertension Hypercholesterolemia Weight loss secondary to Mounjaro, metastatic carcinoma? 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  [name] appears stable.  He continues treatment with 5-FU/Panitumumab .  Aside from skin and nail toxicity he is tolerating well.  He will continue supportive care for the skin and nail issues.  Plan to proceed with another cycle of 5-FU/Panitumumab  today.  CBC and chemistry panel reviewed.  Labs adequate to proceed with treatment.  Magnesium  level is normal.  For the difficulty sleeping he will try Xanax 0.25-0.5 mg at bedtime.  He will return for follow-up and treatment in 2 weeks.   Diana Forster ANP/GNP-BC   12/25/2023  8:50 AM

## 2023-12-25 NOTE — Progress Notes (Signed)
 Labs and VS within tx parameters today.

## 2023-12-25 NOTE — Progress Notes (Signed)
 Patient seen by Lonna Cobb NP today  Vitals are within treatment parameters:No (Please specify and give further instructions.)  Labs are within treatment parameters: No (Please specify and give further instructions.)   Treatment plan has been signed: No   Per physician team, Patient is ready for treatment and there are NO modifications to the treatment plan.

## 2023-12-25 NOTE — Patient Instructions (Addendum)
 [facility] - A DEPT OF [place]  Discharge Instructions: Thank you for choosing [facility] to provide your oncology and hematology care.   If you have a lab appointment with the Cancer Center, please go directly to the Cancer Center and check in at the registration area.   Wear comfortable clothing and clothing appropriate for easy access to any Portacath or PICC line.   We strive to give you quality time with your provider. You may need to reschedule your appointment if you arrive late (15 or more minutes).  Arriving late affects you and other patients whose appointments are after yours.  Also, if you miss three or more appointments without notifying the office, you may be dismissed from the clinic at the provider's discretion.      For prescription refill requests, have your pharmacy contact our office and allow 72 hours for refills to be completed.    Today you received the following chemotherapy and/or immunotherapy agents Vectibix , Leucovorin  and Fluorouracil .      To help prevent nausea and vomiting after your treatment, we encourage you to take your nausea medication as directed.  BELOW ARE SYMPTOMS THAT SHOULD BE REPORTED IMMEDIATELY: *FEVER GREATER THAN 100.4 F (38 C) OR HIGHER *CHILLS OR SWEATING *NAUSEA AND VOMITING THAT IS NOT CONTROLLED WITH YOUR NAUSEA MEDICATION *UNUSUAL SHORTNESS OF BREATH *UNUSUAL BRUISING OR BLEEDING *URINARY PROBLEMS (pain or burning when urinating, or frequent urination) *BOWEL PROBLEMS (unusual diarrhea, constipation, pain near the anus) TENDERNESS IN MOUTH AND THROAT WITH OR WITHOUT PRESENCE OF ULCERS (sore throat, sores in mouth, or a toothache) UNUSUAL RASH, SWELLING OR PAIN  UNUSUAL VAGINAL DISCHARGE OR ITCHING   Items with * indicate a potential emergency and should be followed up as soon as possible or go to the Emergency Department if any problems should occur.  Please show the CHEMOTHERAPY  ALERT CARD or IMMUNOTHERAPY ALERT CARD at check-in to the Emergency Department and triage nurse.  Should you have questions after your visit or need to cancel or reschedule your appointment, please contact [facility]  Dept: [phone]  and follow the prompts.  Office hours are 8:00 a.m. to 4:30 p.m. Monday - Friday. Please note that voicemails left after 4:00 p.m. may not be returned until the following business day.  We are closed weekends and major holidays. You have access to a nurse at all times for urgent questions. Please call the main number to the clinic Dept: [phone] and follow the prompts.   For any non-urgent questions, you may also contact your provider using MyChart. We now offer e-Visits for anyone 68 and older to request care online for non-urgent symptoms. For details visit [email or web address].   Also download the MyChart app! Go to the app store, search "MyChart", open the app, select [place], and log in with your MyChart username and password.

## 2023-12-27 ENCOUNTER — Inpatient Hospital Stay

## 2023-12-27 VITALS — BP 100/65 | HR 94 | Temp 98.0°F | Resp 16

## 2023-12-27 DIAGNOSIS — C187 Malignant neoplasm of sigmoid colon: Secondary | ICD-10-CM

## 2023-12-27 DIAGNOSIS — Z5112 Encounter for antineoplastic immunotherapy: Secondary | ICD-10-CM | POA: Diagnosis not present

## 2023-12-27 MED ORDER — SODIUM CHLORIDE 0.9% FLUSH
10.0000 mL | INTRAVENOUS | Status: DC | PRN
  Administered 2023-12-27: 10 mL

## 2023-12-27 MED ORDER — HEPARIN SOD (PORK) LOCK FLUSH 100 UNIT/ML IV SOLN
500.0000 [IU] | Freq: Once | INTRAVENOUS | Status: AC | PRN
  Administered 2023-12-27: 500 [IU]

## 2023-12-27 NOTE — Patient Instructions (Signed)
 [facility]  Discharge Instructions: Thank you for choosing [facility] to provide your oncology and hematology care.   If you have a lab appointment with the Cancer Center, please go directly to the Cancer Center and check in at the registration area.   Wear comfortable clothing and clothing appropriate for easy access to any Portacath or PICC line.   We strive to give you quality time with your provider. You may need to reschedule your appointment if you arrive late (15 or more minutes).  Arriving late affects you and other patients whose appointments are after yours.  Also, if you miss three or more appointments without notifying the office, you may be dismissed from the clinic at the provider's discretion.      For prescription refill requests, have your pharmacy contact our office and allow 72 hours for refills to be completed.    Today you received the following Pump Stop.    To help prevent nausea and vomiting after your treatment, we encourage you to take your nausea medication as directed.  BELOW ARE SYMPTOMS THAT SHOULD BE REPORTED IMMEDIATELY: *FEVER GREATER THAN 100.4 F (38 C) OR HIGHER *CHILLS OR SWEATING *NAUSEA AND VOMITING THAT IS NOT CONTROLLED WITH YOUR NAUSEA MEDICATION *UNUSUAL SHORTNESS OF BREATH *UNUSUAL BRUISING OR BLEEDING *URINARY PROBLEMS (pain or burning when urinating, or frequent urination) *BOWEL PROBLEMS (unusual diarrhea, constipation, pain near the anus) TENDERNESS IN MOUTH AND THROAT WITH OR WITHOUT PRESENCE OF ULCERS (sore throat, sores in mouth, or a toothache) UNUSUAL RASH, SWELLING OR PAIN  UNUSUAL VAGINAL DISCHARGE OR ITCHING   Items with * indicate a potential emergency and should be followed up as soon as possible or go to the Emergency Department if any problems should occur.  Please show the CHEMOTHERAPY ALERT CARD or IMMUNOTHERAPY ALERT CARD at check-in to the Emergency  Department and triage nurse.  Should you have questions after your visit or need to cancel or reschedule your appointment, please contact [facility]  Dept: [phone]  and follow the prompts.  Office hours are 8:00 a.m. to 4:30 p.m. Monday - Friday. Please note that voicemails left after 4:00 p.m. may not be returned until the following business day.  We are closed weekends and major holidays. You have access to a nurse at all times for urgent questions. Please call the main number to the clinic Dept: [phone] and follow the prompts.   For any non-urgent questions, you may also contact your provider using MyChart. We now offer e-Visits for anyone 68 and older to request care online for non-urgent symptoms. For details visit [email or web address].   Also download the MyChart app! Go to the app store, search "MyChart", open the app, select [place], and log in with your MyChart username and password.

## 2023-12-27 NOTE — Progress Notes (Signed)
 Patient presents today for Pump Stop. Pump removed without issue. Patient's port flushed without difficulty.  Good blood return noted with no bruising or swelling noted at site.  Band aid applied.  VSS with discharge and left in satisfactory condition with no s/s of distress noted.

## 2024-01-05 ENCOUNTER — Other Ambulatory Visit: Payer: Self-pay | Admitting: Oncology

## 2024-01-05 DIAGNOSIS — C187 Malignant neoplasm of sigmoid colon: Secondary | ICD-10-CM

## 2024-01-08 ENCOUNTER — Inpatient Hospital Stay

## 2024-01-08 ENCOUNTER — Other Ambulatory Visit (HOSPITAL_BASED_OUTPATIENT_CLINIC_OR_DEPARTMENT_OTHER): Payer: Self-pay

## 2024-01-08 ENCOUNTER — Inpatient Hospital Stay (HOSPITAL_BASED_OUTPATIENT_CLINIC_OR_DEPARTMENT_OTHER): Admitting: Oncology

## 2024-01-08 VITALS — BP 98/72 | HR 85 | Resp 18

## 2024-01-08 VITALS — BP 104/83 | HR 78 | Temp 97.8°F | Resp 18 | Ht 66.0 in | Wt 216.9 lb

## 2024-01-08 DIAGNOSIS — C187 Malignant neoplasm of sigmoid colon: Secondary | ICD-10-CM

## 2024-01-08 DIAGNOSIS — Z5112 Encounter for antineoplastic immunotherapy: Secondary | ICD-10-CM | POA: Diagnosis not present

## 2024-01-08 LAB — CBC WITH DIFFERENTIAL (CANCER CENTER ONLY)
Abs Immature Granulocytes: 0.04 10*3/uL (ref 0.00–0.07)
Basophils Absolute: 0.1 10*3/uL (ref 0.0–0.1)
Basophils Relative: 1 %
Eosinophils Absolute: 0.4 10*3/uL (ref 0.0–0.5)
Eosinophils Relative: 3 %
HCT: 47.5 % (ref 39.0–52.0)
Hemoglobin: 16.3 g/dL (ref 13.0–17.0)
Immature Granulocytes: 0 %
Lymphocytes Relative: 17 %
Lymphs Abs: 2 10*3/uL (ref 0.7–4.0)
MCH: 32.5 pg (ref 26.0–34.0)
MCHC: 34.3 g/dL (ref 30.0–36.0)
MCV: 94.6 fL (ref 80.0–100.0)
Monocytes Absolute: 0.9 10*3/uL (ref 0.1–1.0)
Monocytes Relative: 7 %
Neutro Abs: 8.4 10*3/uL — ABNORMAL HIGH (ref 1.7–7.7)
Neutrophils Relative %: 72 %
Platelet Count: 128 10*3/uL — ABNORMAL LOW (ref 150–400)
RBC: 5.02 MIL/uL (ref 4.22–5.81)
RDW: 14.9 % (ref 11.5–15.5)
WBC Count: 11.7 10*3/uL — ABNORMAL HIGH (ref 4.0–10.5)
nRBC: 0 % (ref 0.0–0.2)

## 2024-01-08 LAB — CMP (CANCER CENTER ONLY)
ALT: 30 U/L (ref 0–44)
AST: 30 U/L (ref 15–41)
Albumin: 4.1 g/dL (ref 3.5–5.0)
Alkaline Phosphatase: 129 U/L — ABNORMAL HIGH (ref 38–126)
Anion gap: 13 (ref 5–15)
BUN: 15 mg/dL (ref 6–20)
CO2: 26 mmol/L (ref 22–32)
Calcium: 9.6 mg/dL (ref 8.9–10.3)
Chloride: 102 mmol/L (ref 98–111)
Creatinine: 0.8 mg/dL (ref 0.61–1.24)
GFR, Estimated: >60 mL/min (ref >60)
Glucose, Bld: 132 mg/dL — ABNORMAL HIGH (ref 70–99)
Potassium: 3.9 mmol/L (ref 3.5–5.1)
Sodium: 141 mmol/L (ref 135–145)
Total Bilirubin: 0.4 mg/dL (ref 0.0–1.2)
Total Protein: 7.3 g/dL (ref 6.5–8.1)

## 2024-01-08 LAB — CEA (ACCESS): CEA (CHCC): 6.2 ng/mL — ABNORMAL HIGH (ref 0.00–5.00)

## 2024-01-08 LAB — MAGNESIUM: Magnesium: 1.8 mg/dL (ref 1.7–2.4)

## 2024-01-08 MED ORDER — SODIUM CHLORIDE 0.9 % IV SOLN
2400.0000 mg/m2 | INTRAVENOUS | Status: DC
  Administered 2024-01-08: 5000 mg via INTRAVENOUS
  Filled 2024-01-08: qty 100

## 2024-01-08 MED ORDER — FLUOROURACIL CHEMO INJECTION 2.5 GM/50ML
400.0000 mg/m2 | Freq: Once | INTRAVENOUS | Status: AC
  Administered 2024-01-08: 850 mg via INTRAVENOUS
  Filled 2024-01-08: qty 17

## 2024-01-08 MED ORDER — SODIUM CHLORIDE 0.9 % IV SOLN
6.0000 mg/kg | Freq: Once | INTRAVENOUS | Status: AC
  Administered 2024-01-08: 600 mg via INTRAVENOUS
  Filled 2024-01-08: qty 20

## 2024-01-08 MED ORDER — LEUCOVORIN CALCIUM INJECTION 350 MG
400.0000 mg/m2 | Freq: Once | INTRAVENOUS | Status: AC
  Administered 2024-01-08: 836 mg via INTRAVENOUS
  Filled 2024-01-08: qty 41.8

## 2024-01-08 MED ORDER — NYSTATIN 100000 UNIT/ML MT SUSP
5.0000 mL | Freq: Four times a day (QID) | OROMUCOSAL | 1 refills | Status: DC | PRN
  Filled 2024-01-08: qty 240, 12d supply, fill #0

## 2024-01-08 MED ORDER — NYSTATIN 100000 UNIT/ML MT SUSP: 5.0000 mL | Freq: Four times a day (QID) | OROMUCOSAL | 1 refills | Status: AC | PRN

## 2024-01-08 MED ORDER — SODIUM CHLORIDE 0.9 % IV SOLN: Freq: Once | INTRAVENOUS | Status: AC

## 2024-01-08 NOTE — Patient Instructions (Signed)
 [facility] - A DEPT OF [facility]   Discharge Instructions: Thank you for choosing [facility] to provide your oncology and hematology care.   If you have a lab appointment with the Cancer Center, please go directly to the Cancer Center and check in at the registration area.   Wear comfortable clothing and clothing appropriate for easy access to any Portacath or PICC line.   We strive to give you quality time with your provider. You may need to reschedule your appointment if you arrive late (15 or more minutes).  Arriving late affects you and other patients whose appointments are after yours.  Also, if you miss three or more appointments without notifying the office, you may be dismissed from the clinic at the provider's discretion.      For prescription refill requests, have your pharmacy contact our office and allow 72 hours for refills to be completed.    Today you received the following chemotherapy and/or immunotherapy agents Panitumumab  (VECTIBIX ), Leucovorin  & Flourouracil (ADRUCIL ).      To help prevent nausea and vomiting after your treatment, we encourage you to take your nausea medication as directed.  BELOW ARE SYMPTOMS THAT SHOULD BE REPORTED IMMEDIATELY: *FEVER GREATER THAN 100.4 F (38 C) OR HIGHER *CHILLS OR SWEATING *NAUSEA AND VOMITING THAT IS NOT CONTROLLED WITH YOUR NAUSEA MEDICATION *UNUSUAL SHORTNESS OF BREATH *UNUSUAL BRUISING OR BLEEDING *URINARY PROBLEMS (pain or burning when urinating, or frequent urination) *BOWEL PROBLEMS (unusual diarrhea, constipation, pain near the anus) TENDERNESS IN MOUTH AND THROAT WITH OR WITHOUT PRESENCE OF ULCERS (sore throat, sores in mouth, or a toothache) UNUSUAL RASH, SWELLING OR PAIN  UNUSUAL VAGINAL DISCHARGE OR ITCHING   Items with * indicate a potential emergency and should be followed up as soon as possible or go to the Emergency Department if any problems should  occur.  Please show the CHEMOTHERAPY ALERT CARD or IMMUNOTHERAPY ALERT CARD at check-in to the Emergency Department and triage nurse.  Should you have questions after your visit or need to cancel or reschedule your appointment, please contact [facility] - A DEPT OF [facility]  Dept: [phone]  and follow the prompts.  Office hours are 8:00 a.m. to 4:30 p.m. Monday - Friday. Please note that voicemails left after 4:00 p.m. may not be returned until the following business day.  We are closed weekends and major holidays. You have access to a nurse at all times for urgent questions. Please call the main number to the clinic Dept: [phone] and follow the prompts.   For any non-urgent questions, you may also contact your provider using MyChart. We now offer e-Visits for anyone 14 and older to request care online for non-urgent symptoms. For details visit [email or web address].   Also download the MyChart app! Go to the app store, search MyChart, open the app, select [facility], and log in with your MyChart username and password.  Panitumumab  Injection What is this medication? PANITUMUMAB  (pan i TOOM ue mab) treats colorectal cancer. It works by blocking a protein that causes cancer cells to grow and multiply. This helps to slow or stop the spread of cancer cells. It is a monoclonal antibody. This medicine may be used for other purposes; ask your health care provider or pharmacist if you have questions. COMMON BRAND NAME(S): Vectibix  What should I tell my care team before I take this medication? They need to know if you have any of these  conditions: Eye disease Low levels of magnesium  in the blood Lung disease An unusual or allergic reaction to panitumumab , other medications, foods, dyes, or preservatives Pregnant or trying to get pregnant Breast-feeding How should I use this medication? This medication is injected into a vein. It is given by your care  team in a hospital or clinic setting. Talk to your care team about the use of this medication in children. Special care may be needed. Overdosage: If you think you have taken too much of this medicine contact a poison control center or emergency room at once. NOTE: This medicine is only for you. Do not share this medicine with others. What if I miss a dose? Keep appointments for follow-up doses. It is important not to miss your dose. Call your care team if you are unable to keep an appointment. What may interact with this medication? Bevacizumab This list may not describe all possible interactions. Give your health care provider a list of all the medicines, herbs, non-prescription drugs, or dietary supplements you use. Also tell them if you smoke, drink alcohol, or use illegal drugs. Some items may interact with your medicine. What should I watch for while using this medication? Your condition will be monitored carefully while you are receiving this medication. This medication may make you feel generally unwell. This is not uncommon as chemotherapy can affect healthy cells as well as cancer cells. Report any side effects. Continue your course of treatment even though you feel ill unless your care team tells you to stop. This medication can make you more sensitive to the sun. Keep out of the sun while receiving this medication and for 2 months after stopping therapy. If you cannot avoid being in the sun, wear protective clothing and sunscreen. Do not use sun lamps, tanning beds, or tanning booths. Check with your care team if you have severe diarrhea, nausea, and vomiting or if you sweat a lot. The loss of too much body fluid may make it dangerous for you to take this medication. This medication may cause serious skin reactions. They can happen weeks to months after starting the medication. Contact your care team right away if you notice fevers or flu-like symptoms with a rash. The rash may be red or  purple and then turn into blisters or peeling of the skin. You may also notice a red rash with swelling of the face, lips, or lymph nodes in your neck or under your arms. Talk to your care team if you may be pregnant. Serious birth defects can occur if you take this medication during pregnancy and for 2 months after the last dose. Contraception is recommended while taking this medication and for 2 months after the last dose. Your care team can help you find the option that works for you. Do not breastfeed while taking this medication and for 2 months after the last dose. This medication may cause infertility. Talk to your care team if you are concerned about your fertility. What side effects may I notice from receiving this medication? Side effects that you should report to your care team as soon as possible: Allergic reactions--skin rash, itching, hives, swelling of the face, lips, tongue, or throat Dry cough, shortness of breath or trouble breathing Eye pain, redness, irritation, or discharge with blurry or decreased vision Infusion reactions--chest pain, shortness of breath or trouble breathing, feeling faint or lightheaded Low magnesium  level--muscle pain or cramps, unusual weakness or fatigue, fast or irregular heartbeat, tremors Low potassium level--muscle pain  or cramps, unusual weakness or fatigue, fast or irregular heartbeat, constipation Redness, blistering, peeling, or loosening of the skin, including inside the mouth Skin reactions on sun-exposed areas Side effects that usually do not require medical attention (report to your care team if they continue or are bothersome): Change in nail shape, thickness, or color Diarrhea Dry skin Fatigue Nausea Vomiting This list may not describe all possible side effects. Call your doctor for medical advice about side effects. You may report side effects to FDA at 1-800-FDA-1088. Where should I keep my medication? This medication is given in a  hospital or clinic. It will not be stored at home. NOTE: This sheet is a summary. It may not cover all possible information. If you have questions about this medicine, talk to your doctor, pharmacist, or health care provider.  2024 Elsevier/Gold Standard (2021-11-22 00:00:00) Leucovorin  Injection What is this medication? LEUCOVORIN  (loo koe VOR in) prevents side effects from certain medications, such as methotrexate. It works by increasing folate levels. This helps protect healthy cells in your body. It may also be used to treat anemia caused by low levels of folate. It can also be used with fluorouracil , a type of chemotherapy, to treat colorectal cancer. It works by increasing the effects of fluorouracil  in the body. This medicine may be used for other purposes; ask your health care provider or pharmacist if you have questions. What should I tell my care team before I take this medication? They need to know if you have any of these conditions: Anemia from low levels of vitamin B12 in the blood An unusual or allergic reaction to leucovorin , folic acid, other medications, foods, dyes, or preservatives Pregnant or trying to get pregnant Breastfeeding How should I use this medication? This medication is injected into a vein or a muscle. It is given by your care team in a hospital or clinic setting. Talk to your care team about the use of this medication in children. Special care may be needed. Overdosage: If you think you have taken too much of this medicine contact a poison control center or emergency room at once. NOTE: This medicine is only for you. Do not share this medicine with others. What if I miss a dose? Keep appointments for follow-up doses. It is important not to miss your dose. Call your care team if you are unable to keep an appointment. What may interact with this medication? Capecitabine Fluorouracil  Phenobarbital Phenytoin Primidone Trimethoprim ;sulfamethoxazole  This list  may not describe all possible interactions. Give your health care provider a list of all the medicines, herbs, non-prescription drugs, or dietary supplements you use. Also tell them if you smoke, drink alcohol, or use illegal drugs. Some items may interact with your medicine. What should I watch for while using this medication? Your condition will be monitored carefully while you are receiving this medication. This medication may increase the side effects of 5-fluorouracil . Tell your care team if you have diarrhea or mouth sores that do not get better or that get worse. What side effects may I notice from receiving this medication? Side effects that you should report to your care team as soon as possible: Allergic reactions--skin rash, itching, hives, swelling of the face, lips, tongue, or throat This list may not describe all possible side effects. Call your doctor for medical advice about side effects. You may report side effects to FDA at 1-800-FDA-1088. Where should I keep my medication? This medication is given in a hospital or clinic. It will not be  stored at home. NOTE: This sheet is a summary. It may not cover all possible information. If you have questions about this medicine, talk to your doctor, pharmacist, or health care provider.  2024 Elsevier/Gold Standard (2021-12-12 00:00:00)  Fluorouracil  Injection What is this medication? FLUOROURACIL  (flure oh YOOR a sil) treats some types of cancer. It works by slowing down the growth of cancer cells. This medicine may be used for other purposes; ask your health care provider or pharmacist if you have questions. COMMON BRAND NAME(S): Adrucil  What should I tell my care team before I take this medication? They need to know if you have any of these conditions: Blood disorders Dihydropyrimidine dehydrogenase (DPD) deficiency Infection, such as chickenpox, cold sores, herpes Kidney disease Liver disease Poor nutrition Recent or ongoing  radiation therapy An unusual or allergic reaction to fluorouracil , other medications, foods, dyes, or preservatives If you or your partner are pregnant or trying to get pregnant Breast-feeding How should I use this medication? This medication is injected into a vein. It is administered by your care team in a hospital or clinic setting. Talk to your care team about the use of this medication in children. Special care may be needed. Overdosage: If you think you have taken too much of this medicine contact a poison control center or emergency room at once. NOTE: This medicine is only for you. Do not share this medicine with others. What if I miss a dose? Keep appointments for follow-up doses. It is important not to miss your dose. Call your care team if you are unable to keep an appointment. What may interact with this medication? Do not take this medication with any of the following: Live virus vaccines This medication may also interact with the following: Medications that treat or prevent blood clots, such as warfarin, enoxaparin , dalteparin This list may not describe all possible interactions. Give your health care provider a list of all the medicines, herbs, non-prescription drugs, or dietary supplements you use. Also tell them if you smoke, drink alcohol, or use illegal drugs. Some items may interact with your medicine. What should I watch for while using this medication? Your condition will be monitored carefully while you are receiving this medication. This medication may make you feel generally unwell. This is not uncommon as chemotherapy can affect healthy cells as well as cancer cells. Report any side effects. Continue your course of treatment even though you feel ill unless your care team tells you to stop. In some cases, you may be given additional medications to help with side effects. Follow all directions for their use. This medication may increase your risk of getting an infection.  Call your care team for advice if you get a fever, chills, sore throat, or other symptoms of a cold or flu. Do not treat yourself. Try to avoid being around people who are sick. This medication may increase your risk to bruise or bleed. Call your care team if you notice any unusual bleeding. Be careful brushing or flossing your teeth or using a toothpick because you may get an infection or bleed more easily. If you have any dental work done, tell your dentist you are receiving this medication. Avoid taking medications that contain aspirin , acetaminophen , ibuprofen, naproxen, or ketoprofen unless instructed by your care team. These medications may hide a fever. Do not treat diarrhea with over the counter products. Contact your care team if you have diarrhea that lasts more than 2 days or if it is severe and watery.  This medication can make you more sensitive to the sun. Keep out of the sun. If you cannot avoid being in the sun, wear protective clothing and sunscreen. Do not use sun lamps, tanning beds, or tanning booths. Talk to your care team if you or your partner wish to become pregnant or think you might be pregnant. This medication can cause serious birth defects if taken during pregnancy and for 3 months after the last dose. A reliable form of contraception is recommended while taking this medication and for 3 months after the last dose. Talk to your care team about effective forms of contraception. Do not father a child while taking this medication and for 3 months after the last dose. Use a condom while having sex during this time period. Do not breastfeed while taking this medication. This medication may cause infertility. Talk to your care team if you are concerned about your fertility. What side effects may I notice from receiving this medication? Side effects that you should report to your care team as soon as possible: Allergic reactions--skin rash, itching, hives, swelling of the face, lips,  tongue, or throat Heart attack--pain or tightness in the chest, shoulders, arms, or jaw, nausea, shortness of breath, cold or clammy skin, feeling faint or lightheaded Heart failure--shortness of breath, swelling of the ankles, feet, or hands, sudden weight gain, unusual weakness or fatigue Heart rhythm changes--fast or irregular heartbeat, dizziness, feeling faint or lightheaded, chest pain, trouble breathing High ammonia level--unusual weakness or fatigue, confusion, loss of appetite, nausea, vomiting, seizures Infection--fever, chills, cough, sore throat, wounds that don't heal, pain or trouble when passing urine, general feeling of discomfort or being unwell Low red blood cell level--unusual weakness or fatigue, dizziness, headache, trouble breathing Pain, tingling, or numbness in the hands or feet, muscle weakness, change in vision, confusion or trouble speaking, loss of balance or coordination, trouble walking, seizures Redness, swelling, and blistering of the skin over hands and feet Severe or prolonged diarrhea Unusual bruising or bleeding Side effects that usually do not require medical attention (report to your care team if they continue or are bothersome): Dry skin Headache Increased tears Nausea Pain, redness, or swelling with sores inside the mouth or throat Sensitivity to light Vomiting This list may not describe all possible side effects. Call your doctor for medical advice about side effects. You may report side effects to FDA at 1-800-FDA-1088. Where should I keep my medication? This medication is given in a hospital or clinic. It will not be stored at home. NOTE: This sheet is a summary. It may not cover all possible information. If you have questions about this medicine, talk to your doctor, pharmacist, or health care provider.  2024 Elsevier/Gold Standard (2021-11-14 00:00:00)  The chemotherapy medication bag should finish at 46 hours, 96 hours, or 7 days. For example,  if your pump is scheduled for 46 hours and it was put on at 4:00 p.m., it should finish at 2:00 p.m. the day it is scheduled to come off regardless of your appointment time.     Estimated time to finish at 11:00 AM on Friday 01/10/2024.   If the display on your pump reads Low Volume and it is beeping, take the batteries out of the pump and come to the cancer center for it to be taken off.   If the pump alarms go off prior to the pump reading Low Volume then call [phone] and someone can assist you.  If the plunger comes out and the  chemotherapy medication is leaking out, please use your home chemo spill kit to clean up the spill. Do NOT use paper towels or other household products.  If you have problems or questions regarding your pump, please call either [phone] (24 hours a day) or the cancer center Monday-Friday 8:00 a.m.- 4:30 p.m. at the clinic number and we will assist you. If you are unable to get assistance, then go to the nearest Emergency Department and ask the staff to contact the IV team for assistance.

## 2024-01-08 NOTE — Progress Notes (Signed)
 Patient seen by Dr. Thornton Papas today  Vitals are within treatment parameters:Yes   Labs are within treatment parameters: Yes   Treatment plan has been signed: Yes   Per physician team, Patient is ready for treatment and there are NO modifications to the treatment plan.

## 2024-01-08 NOTE — Progress Notes (Signed)
 [name]   Diagnosis: Colon cancer  INTERVAL HISTORY:   Daniel [name] completed another cycle of 5-FU/panitumumab  on 12/25/2023.  He reports mouth soreness following chemotherapy.  This was relieved with Magic mouthwash.  No nausea or diarrhea.  Paronychia at the right great toe has improved.  The skin rash varies in intensity.  No difficulty with bowel function.  Objective:  Vital signs in last 24 hours:  Blood pressure 104/83, pulse 78, temperature 97.8 F (36.6 C), temperature source Temporal, resp. rate 18, height 5' 6 (1.676 m), weight 216 lb 14.4 oz (98.4 kg), SpO2 100%.    HEENT: Mild erythema at the palate and buccal mucosa, no ulcers or thrush Resp: Lungs clear bilaterally Cardio: Regular rate and rhythm GI: No hepatosplenomegaly Vascular: No leg edema  Skin: Dryness and erythema over the face, acne type rash over the trunk, mild paronychia of the right great toe  Portacath/PICC-without erythema  Lab Results:  Lab Results  Component Value Date   WBC 11.7 (H) 01/08/2024   HGB 16.3 01/08/2024   HCT 47.5 01/08/2024   MCV 94.6 01/08/2024   PLT 128 (L) 01/08/2024   NEUTROABS 8.4 (H) 01/08/2024    CMP  Lab Results  Component Value Date   NA 140 12/25/2023   K 3.5 12/25/2023   CL 102 12/25/2023   CO2 25 12/25/2023   GLUCOSE 130 (H) 12/25/2023   BUN 12 12/25/2023   CREATININE 0.81 12/25/2023   CALCIUM  9.4 12/25/2023   PROT 7.2 12/25/2023   ALBUMIN 3.9 12/25/2023   AST 32 12/25/2023   ALT 31 12/25/2023   ALKPHOS 118 12/25/2023   BILITOT 0.7 12/25/2023   GFRNONAA >60 12/25/2023   GFRAA >60 07/23/2016    Lab Results  Component Value Date   CEA 4.60 12/25/2023     Medications: I have reviewed the patient's current medications.   Assessment/Plan: Sigmoid colon mass, multiple liver lesions Ultrasound abdomen 01/08/2023-multifocal hyperechoic liver lesions, largest measuring 8.7 cm in the right hepatic lobe CT  abdomen/pelvis 01/08/2023-multiple by lobar hypoattenuating hepatic lesions, masslike thickening of the sigmoid colon with abnormal spiculated pericolonic soft tissue.  IMA lymphadenopathy.  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 Colonoscopy 01/29/2023-malignant tumor in the sigmoid colon (invasive moderately differentiated adenocarcinoma), four 6-9 mm polyps in the rectum, ascending colon and cecum, one 14 mm polyp in the sigmoid colon (within the rectal polyp biopsies there are 2 similar-appearing fragments showing invasive tumor with associated adenomatous change which is felt to be carryover from the sigmoid mass biopsies, the rectal polyp biopsies also show multiple fragments of tubular adenoma without high-grade dysplasia) Cycle 1 FOLFOX 01/30/2023 Cycle 2 FOLFOX 02/12/2023 Cycle 3 FOLFOX plus Panitumumab  02/27/2023 Cycle 4 FOLFOX plus Panitumumab  03/13/2023 Cycle 5 FOLFOX plus panitumumab  03/27/2023 Cycle 6 FOLFOX plus Panitumumab  04/10/2023 CTs 04/19/2023-decrease size of hepatic metastases, no new lesions, decreased right upper quadrant and retroperitoneal lymph nodes, sigmoid colonic wall thickening no longer seen with a decrease in sigmoid mesenteric lymph nodes Cycle 7 FOLFOX plus panitumumab  04/24/2023, oxaliplatin  dose reduced secondary to thrombocytopenia and neuropathy Cycle 8 FOLFOX plus Panitumumab  05/08/2023 Cycle 9 FOLFOX plus Panitumumab  05/22/2023 Cycle 10 FOLFOX plus Panitumumab  06/05/2023 Cycle 11 FOLFOX plus panitumumab  06/19/2023 Cycle 12 FOLFOX plus Panitumumab  07/03/2023, oxaliplatin  held due to thrombocytopenia CTs 07/11/2023: Decrease in hepatic metastases, no enlarged abdominal  or pelvic nodes, no metastatic disease to the chest Cycle  13 of 5-FU/panitumumab  07/23/2023 Cycle 14 5-FU/Panitumumab  08/07/2023 Cycle 15 5-FU/panitumumab  08/21/2023 Cycle 16 5-FU/Panitumumab  09/04/2023 Cycle 17 5-FU/panitumumab  09/18/2023 Cycle 18 5-FU/Panitumumab  10/02/2023 Cycle 19 5-FU/Panitumumab  10/16/2023 CTs 10/24/2023: Decrease in hepatic metastases, no evidence of disease progression Cycle 20 5-FU/panitumumab  10/30/2023 Cycle 21 5-FU/Panitumumab  11/13/2023 Cycle 22 5-FU/Panitumumab  11/27/2023 Cycle 23 5-FU/panitumumab  12/11/2023 Cycle 24 5-FU/Panitumumab  12/25/2023 Cycle 25 5-FU/panitumumab  01/08/2024 Diabetes Cardiomyopathy Hypertension Hypercholesterolemia Weight loss secondary to Mounjaro, metastatic carcinoma? 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  He will complete another cycle of 5-FU/panitumumab  today.  He will undergo a restaging CT evaluation prior to an office visit 01/29/2024.  Coni Deep, MD  01/08/2024  8:37 AM

## 2024-01-10 ENCOUNTER — Inpatient Hospital Stay

## 2024-01-10 VITALS — BP 104/74 | HR 100 | Temp 98.1°F | Resp 18

## 2024-01-10 DIAGNOSIS — C187 Malignant neoplasm of sigmoid colon: Secondary | ICD-10-CM

## 2024-01-10 DIAGNOSIS — Z5112 Encounter for antineoplastic immunotherapy: Secondary | ICD-10-CM | POA: Diagnosis not present

## 2024-01-10 MED ORDER — HEPARIN SOD (PORK) LOCK FLUSH 100 UNIT/ML IV SOLN
500.0000 [IU] | Freq: Once | INTRAVENOUS | Status: AC | PRN
  Administered 2024-01-10: 500 [IU]

## 2024-01-10 MED ORDER — SODIUM CHLORIDE 0.9% FLUSH
10.0000 mL | INTRAVENOUS | Status: DC | PRN
  Administered 2024-01-10: 10 mL

## 2024-01-10 NOTE — Patient Instructions (Signed)
 Heparin  Injection What is this medication? HEPARIN  INJECTION (HEP a rin) prevents and treats blood clots. It belongs to a group of medications called blood thinners. This medicine may be used for other purposes; ask your health care provider or pharmacist if you have questions. COMMON BRAND NAME(S): Hep-Lock, Hep-Lock U/P, Hepflush-10, Monoject Prefill Advanced Heparin  Lock Flush, SASH Normal Saline and Heparin  What should I tell my care team before I take this medication? They need to know if you have any of these conditions: Bleeding disorders, such as hemophilia or low blood platelets Bowel disease or diverticulitis Endocarditis High blood pressure Liver disease Recent surgery Stomach ulcers An unusual or allergic reaction to heparin , other medications, foods, dyes, or preservatives Pregnant, trying to get pregnant, or recently pregnant Breastfeeding How should I use this medication? This medication is injected into a vein or under the skin. It is usually given by your care team in a hospital or clinic setting. If you get this medication at home, you will be taught how to prepare and give it. For your therapy to work as well as possible, take each dose exactly as prescribed on the prescription label. Do not skip doses. Skipping doses or stopping this medication can increase your risk of a blood clot. Keep taking this medication unless your care team tells you to stop. It is important that you put your used needles and syringes in a special sharps container. Do not put them in a trash can. If you do not have a sharps container, call your pharmacist or care team to get one. Talk to your care team about the use of this medication in children. While this medication may be prescribed for children for selected conditions, precautions do apply. Overdosage: If you think you have taken too much of this medicine contact a poison control center or emergency room at once. NOTE: This medicine is only  for you. Do not share this medicine with others. What if I miss a dose? If you miss a dose, take it as soon as you can. If it is almost time for your next dose, take only that dose. Do not take double or extra doses. What may interact with this medication? Do not take this medication with any of the following: Defibrotide Mifepristone Oritavancin Protamine Telavancin This medication may also interact with the following: Aspirin and aspirin-like medications NSAIDs, medications for pain and inflammation, such as ibuprofen or naproxen Other medications that treat or prevent blood clots, such as warfarin or enoxaparin This list may not describe all possible interactions. Give your health care provider a list of all the medicines, herbs, non-prescription drugs, or dietary supplements you use. Also tell them if you smoke, drink alcohol, or use illegal drugs. Some items may interact with your medicine. What should I watch for while using this medication? Your condition will be monitored carefully while you are receiving this medication. Tell your care team if your symptoms do not start to get better or if they get worse. You may need blood work done while you are taking this medication. In rare cases, the body's immune system may cause platelets to clump together in response to heparin . This causes a blood clot. It can happen weeks after stopping heparin . Talk to your care team right away if you have pain, swelling, or warmth in the leg, shortness of breath, or chest pain. Avoid sports and activities that may cause injury while you are taking this medication. Severe falls or injuries can cause unseen bleeding. Be  careful when using sharp tools or knives. Consider using an Neurosurgeon. Take special care brushing or flossing your teeth. Report any injuries, bruising, or red spots on the skin to your care team. If you are going to need surgery or a procedure, tell your care team that you are taking this  medication. Tell your dentist and dental surgeon that you are taking this medication. Wear a medical ID bracelet or chain. Carry a card that describes your condition. List the medications and doses you take on the card. What side effects may I notice from receiving this medication? Side effects that you should report to your care team as soon as possible: Allergic reactions--skin rash, itching, hives, swelling of the face, lips, tongue, or throat Bleeding--bloody or black, tar-like stools, vomiting blood or brown material that looks like coffee grounds, red or dark brown urine, small red or purple spots on skin, unusual bruising or bleeding Blood clot--pain, swelling, or warmth in the leg, shortness of breath, chest pain High potassium level--muscle weakness, fast or irregular heartbeat Side effects that usually do not require medical attention (report these to your care team if they continue or are bothersome): Pain, redness, or irritation at injection site This list may not describe all possible side effects. Call your doctor for medical advice about side effects. You may report side effects to FDA at 1-800-FDA-1088. Where should I keep my medication? Keep out of the reach of children and pets. Store at room temperature between 20 and 25 degrees C (68 and 77 degrees F). Do not freeze. Get rid of any unused medication after the expiration date. To get rid of medications that are no longer needed or have expired: Take the medication to a take-back program. Check with your pharmacy or law enforcement to find a location. If you cannot return the medication, ask your pharmacist or care team how to get rid of it safely. NOTE: This sheet is a summary. It may not cover all possible information. If you have questions about this medicine, talk to your doctor, pharmacist, or health care provider.  2025 Elsevier/Gold Standard (2023-08-08 00:00:00)

## 2024-01-22 ENCOUNTER — Ambulatory Visit (HOSPITAL_BASED_OUTPATIENT_CLINIC_OR_DEPARTMENT_OTHER)
Admission: RE | Admit: 2024-01-22 | Discharge: 2024-01-22 | Disposition: A | Discharge status: Home / Self Care | Source: Ambulatory Visit | Attending: Nurse Practitioner | Admitting: Nurse Practitioner

## 2024-01-22 DIAGNOSIS — C187 Malignant neoplasm of sigmoid colon: Secondary | ICD-10-CM | POA: Insufficient documentation

## 2024-01-22 MED ORDER — IOHEXOL 300 MG/ML  SOLN
100.0000 mL | Freq: Once | INTRAMUSCULAR | Status: AC | PRN
  Administered 2024-01-22: 100 mL via INTRAVENOUS

## 2024-01-26 ENCOUNTER — Other Ambulatory Visit: Payer: Self-pay | Admitting: Oncology

## 2024-01-29 ENCOUNTER — Inpatient Hospital Stay

## 2024-01-29 ENCOUNTER — Inpatient Hospital Stay: Attending: Oncology

## 2024-01-29 ENCOUNTER — Inpatient Hospital Stay (HOSPITAL_BASED_OUTPATIENT_CLINIC_OR_DEPARTMENT_OTHER): Admitting: Oncology

## 2024-01-29 VITALS — BP 102/73 | HR 96 | Temp 98.0°F | Resp 18 | Ht 66.0 in | Wt 220.1 lb

## 2024-01-29 DIAGNOSIS — C187 Malignant neoplasm of sigmoid colon: Secondary | ICD-10-CM | POA: Diagnosis not present

## 2024-01-29 DIAGNOSIS — Z5112 Encounter for antineoplastic immunotherapy: Secondary | ICD-10-CM | POA: Insufficient documentation

## 2024-01-29 DIAGNOSIS — C787 Secondary malignant neoplasm of liver and intrahepatic bile duct: Secondary | ICD-10-CM | POA: Diagnosis not present

## 2024-01-29 DIAGNOSIS — Z5111 Encounter for antineoplastic chemotherapy: Secondary | ICD-10-CM | POA: Diagnosis present

## 2024-01-29 LAB — CBC WITH DIFFERENTIAL (CANCER CENTER ONLY)
Abs Immature Granulocytes: 0.05 K/uL (ref 0.00–0.07)
Basophils Absolute: 0.1 K/uL (ref 0.0–0.1)
Basophils Relative: 1 %
Eosinophils Absolute: 0.6 K/uL — ABNORMAL HIGH (ref 0.0–0.5)
Eosinophils Relative: 5 %
HCT: 47.1 % (ref 39.0–52.0)
Hemoglobin: 16.1 g/dL (ref 13.0–17.0)
Immature Granulocytes: 1 %
Lymphocytes Relative: 19 %
Lymphs Abs: 2.1 K/uL (ref 0.7–4.0)
MCH: 32.5 pg (ref 26.0–34.0)
MCHC: 34.2 g/dL (ref 30.0–36.0)
MCV: 95 fL (ref 80.0–100.0)
Monocytes Absolute: 1.1 K/uL — ABNORMAL HIGH (ref 0.1–1.0)
Monocytes Relative: 10 %
Neutro Abs: 7.3 K/uL (ref 1.7–7.7)
Neutrophils Relative %: 64 %
Platelet Count: 163 K/uL (ref 150–400)
RBC: 4.96 MIL/uL (ref 4.22–5.81)
RDW: 14.4 % (ref 11.5–15.5)
WBC Count: 11.1 K/uL — ABNORMAL HIGH (ref 4.0–10.5)
nRBC: 0 % (ref 0.0–0.2)

## 2024-01-29 LAB — CMP (CANCER CENTER ONLY)
ALT: 33 U/L (ref 0–44)
AST: 36 U/L (ref 15–41)
Albumin: 3.9 g/dL (ref 3.5–5.0)
Alkaline Phosphatase: 134 U/L — ABNORMAL HIGH (ref 38–126)
Anion gap: 12 (ref 5–15)
BUN: 14 mg/dL (ref 6–20)
CO2: 24 mmol/L (ref 22–32)
Calcium: 8.8 mg/dL — ABNORMAL LOW (ref 8.9–10.3)
Chloride: 105 mmol/L (ref 98–111)
Creatinine: 0.86 mg/dL (ref 0.61–1.24)
GFR, Estimated: >60 mL/min (ref >60)
Glucose, Bld: 112 mg/dL — ABNORMAL HIGH (ref 70–99)
Potassium: 3.7 mmol/L (ref 3.5–5.1)
Sodium: 141 mmol/L (ref 135–145)
Total Bilirubin: 0.5 mg/dL (ref 0.0–1.2)
Total Protein: 7.3 g/dL (ref 6.5–8.1)

## 2024-01-29 LAB — MAGNESIUM: Magnesium: 1.7 mg/dL (ref 1.7–2.4)

## 2024-01-29 LAB — CEA (ACCESS): CEA (CHCC): 11.54 ng/mL — ABNORMAL HIGH (ref 0.00–5.00)

## 2024-01-29 MED ORDER — SODIUM CHLORIDE 0.9 % IV SOLN: Freq: Once | INTRAVENOUS | Status: AC

## 2024-01-29 MED ORDER — FLUOROURACIL CHEMO INJECTION 2.5 GM/50ML
400.0000 mg/m2 | Freq: Once | INTRAVENOUS | Status: AC
  Administered 2024-01-29: 850 mg via INTRAVENOUS
  Filled 2024-01-29: qty 17

## 2024-01-29 MED ORDER — LEUCOVORIN CALCIUM INJECTION 350 MG
400.0000 mg/m2 | Freq: Once | INTRAVENOUS | Status: AC
  Administered 2024-01-29: 836 mg via INTRAVENOUS
  Filled 2024-01-29: qty 41.8

## 2024-01-29 MED ORDER — SODIUM CHLORIDE 0.9 % IV SOLN
6.0000 mg/kg | Freq: Once | INTRAVENOUS | Status: AC
  Administered 2024-01-29: 600 mg via INTRAVENOUS
  Filled 2024-01-29: qty 20

## 2024-01-29 MED ORDER — SODIUM CHLORIDE 0.9 % IV SOLN
2400.0000 mg/m2 | INTRAVENOUS | Status: DC
  Administered 2024-01-29: 5000 mg via INTRAVENOUS
  Filled 2024-01-29: qty 100

## 2024-01-29 MED ORDER — DEXTROSE 5 % IV SOLN
Freq: Once | INTRAVENOUS | Status: AC
Start: 2024-01-29 — End: 2024-01-29

## 2024-01-29 MED ORDER — LEUCOVORIN CALCIUM INJECTION 350 MG
400.0000 mg/m2 | Freq: Once | INTRAVENOUS | Status: DC
  Filled 2024-01-29: qty 41.8

## 2024-01-29 NOTE — Progress Notes (Signed)
 [facility] OFFICE PROGRESS NOTE   Diagnosis: Colon cancer  INTERVAL HISTORY:   Daniel [name] returns as scheduled.  He completed another cycle of 5-FU and panitumumab  on 01/08/2024.  No mouth sores or diarrhea.  The skin rash has improved.  Paronychia at the toes has resolved.  Objective:  Vital signs in last 24 hours:  Blood pressure 102/73, pulse 96, temperature 98 F (36.7 C), temperature source Temporal, resp. rate 18, height 5' 6 (1.676 m), weight 220 lb 1.6 oz (99.8 kg), SpO2 100%.    HEENT: No thrush or ulcers Resp: Distant breath sounds, no respiratory distress Cardio: Regular rate and rhythm GI: No hepatosplenomegaly Vascular: No leg edema  Skin: Acne type rash over the trunk and extremities  Portacath/PICC-without erythema  Lab Results:  Lab Results  Component Value Date   WBC 11.1 (H) 01/29/2024   HGB 16.1 01/29/2024   HCT 47.1 01/29/2024   MCV 95.0 01/29/2024   PLT 163 01/29/2024   NEUTROABS 7.3 01/29/2024    CMP  Lab Results  Component Value Date   NA 141 01/08/2024   K 3.9 01/08/2024   CL 102 01/08/2024   CO2 26 01/08/2024   GLUCOSE 132 (H) 01/08/2024   BUN 15 01/08/2024   CREATININE 0.80 01/08/2024   CALCIUM  9.6 01/08/2024   PROT 7.3 01/08/2024   ALBUMIN 4.1 01/08/2024   AST 30 01/08/2024   ALT 30 01/08/2024   ALKPHOS 129 (H) 01/08/2024   BILITOT 0.4 01/08/2024   GFRNONAA >60 01/08/2024   GFRAA >60 07/23/2016    Lab Results  Component Value Date   CEA 6.20 (H) 01/08/2024     Medications: I have reviewed the patient's current medications.   Assessment/Plan: Sigmoid colon mass, multiple liver lesions Ultrasound abdomen 01/08/2023-multifocal hyperechoic liver lesions, largest measuring 8.7 cm in the right hepatic lobe CT abdomen/pelvis 01/08/2023-multiple by lobar hypoattenuating hepatic lesions, masslike thickening of the sigmoid colon with abnormal spiculated pericolonic soft tissue.  IMA lymphadenopathy.  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 Colonoscopy 01/29/2023-malignant tumor in the sigmoid colon (invasive moderately differentiated adenocarcinoma), four 6-9 mm polyps in the rectum, ascending colon and cecum, one 14 mm polyp in the sigmoid colon (within the rectal polyp biopsies there are 2 similar-appearing fragments showing invasive tumor with associated adenomatous change which is felt to be carryover from the sigmoid mass biopsies, the rectal polyp biopsies also show multiple fragments of tubular adenoma without high-grade dysplasia) Cycle 1 FOLFOX 01/30/2023 Cycle 2 FOLFOX 02/12/2023 Cycle 3 FOLFOX plus Panitumumab  02/27/2023 Cycle 4 FOLFOX plus Panitumumab  03/13/2023 Cycle 5 FOLFOX plus panitumumab  03/27/2023 Cycle 6 FOLFOX plus Panitumumab  04/10/2023 CTs 04/19/2023-decrease size of hepatic metastases, no new lesions, decreased right upper quadrant and retroperitoneal lymph nodes, sigmoid colonic wall thickening no longer seen with a decrease in sigmoid mesenteric lymph nodes Cycle 7 FOLFOX plus panitumumab  04/24/2023, oxaliplatin  dose reduced secondary to thrombocytopenia and neuropathy Cycle 8 FOLFOX plus Panitumumab  05/08/2023 Cycle 9 FOLFOX plus Panitumumab  05/22/2023 Cycle 10 FOLFOX plus Panitumumab  06/05/2023 Cycle 11 FOLFOX plus panitumumab  06/19/2023 Cycle 12 FOLFOX plus Panitumumab  07/03/2023, oxaliplatin  held due to thrombocytopenia CTs 07/11/2023: Decrease in hepatic metastases, no enlarged abdominal or pelvic nodes, no metastatic disease to the chest Cycle 13 of 5-FU/panitumumab  07/23/2023 Cycle 14 5-FU/Panitumumab  08/07/2023 Cycle 15 5-FU/panitumumab  08/21/2023 Cycle 16 5-FU/Panitumumab  09/04/2023 Cycle 17 5-FU/panitumumab  09/18/2023 Cycle  18  5-FU/Panitumumab  10/02/2023 Cycle 19 5-FU/Panitumumab  10/16/2023 CTs 10/24/2023: Decrease in hepatic metastases, no evidence of disease progression Cycle 20 5-FU/panitumumab  10/30/2023 Cycle 21 5-FU/Panitumumab  11/13/2023 Cycle 22 5-FU/Panitumumab  11/27/2023 Cycle 23 5-FU/panitumumab  12/11/2023 Cycle 24 5-FU/Panitumumab  12/25/2023 Cycle 25 5-FU/panitumumab  01/08/2024 CT 01/22/2024: Stable liver metastases, no evidence of disease progression Cycle 26 5-FU/panitumumab  01/29/2024 Diabetes Cardiomyopathy Hypertension Hypercholesterolemia Weight loss secondary to Mounjaro, metastatic carcinoma? 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  I reviewed the restaging CT findings and images with him.  There are a few remaining liver lesions which appear stable.  I recommend continuing the 5-FU and panitumumab .  Mr. Gazda will complete another cycle of 5-FU/panitumumab  today.  The CEA is mildly elevated.  We will follow the CEA and repeat images within the next few months if the CEA continues to rise.  Arley Hof, MD  01/29/2024  8:38 AM

## 2024-01-29 NOTE — Progress Notes (Signed)
 Patient seen by Dr. Thornton Papas today  Vitals are within treatment parameters:Yes   Labs are within treatment parameters: Yes   Treatment plan has been signed: Yes   Per physician team, Patient is ready for treatment and there are NO modifications to the treatment plan.

## 2024-01-29 NOTE — Patient Instructions (Signed)
 [facility] - A DEPT OF [facility]   Discharge Instructions: Thank you for choosing [facility] to provide your oncology and hematology care.   If you have a lab appointment with the Cancer Center, please go directly to the Cancer Center and check in at the registration area.   Wear comfortable clothing and clothing appropriate for easy access to any Portacath or PICC line.   We strive to give you quality time with your provider. You may need to reschedule your appointment if you arrive late (15 or more minutes).  Arriving late affects you and other patients whose appointments are after yours.  Also, if you miss three or more appointments without notifying the office, you may be dismissed from the clinic at the provider's discretion.      For prescription refill requests, have your pharmacy contact our office and allow 72 hours for refills to be completed.    Today you received the following chemotherapy and/or immunotherapy agents Panitumumab  (VECTIBIX ), Leucovorin  & Flourouracil (ADRUCIL ).      To help prevent nausea and vomiting after your treatment, we encourage you to take your nausea medication as directed.  BELOW ARE SYMPTOMS THAT SHOULD BE REPORTED IMMEDIATELY: *FEVER GREATER THAN 100.4 F (38 C) OR HIGHER *CHILLS OR SWEATING *NAUSEA AND VOMITING THAT IS NOT CONTROLLED WITH YOUR NAUSEA MEDICATION *UNUSUAL SHORTNESS OF BREATH *UNUSUAL BRUISING OR BLEEDING *URINARY PROBLEMS (pain or burning when urinating, or frequent urination) *BOWEL PROBLEMS (unusual diarrhea, constipation, pain near the anus) TENDERNESS IN MOUTH AND THROAT WITH OR WITHOUT PRESENCE OF ULCERS (sore throat, sores in mouth, or a toothache) UNUSUAL RASH, SWELLING OR PAIN  UNUSUAL VAGINAL DISCHARGE OR ITCHING   Items with * indicate a potential emergency and should be followed up as soon as possible or go to the Emergency Department if any problems should  occur.  Please show the CHEMOTHERAPY ALERT CARD or IMMUNOTHERAPY ALERT CARD at check-in to the Emergency Department and triage nurse.  Should you have questions after your visit or need to cancel or reschedule your appointment, please contact [facility] - A DEPT OF [facility]  Dept: [phone]  and follow the prompts.  Office hours are 8:00 a.m. to 4:30 p.m. Monday - Friday. Please note that voicemails left after 4:00 p.m. may not be returned until the following business day.  We are closed weekends and major holidays. You have access to a nurse at all times for urgent questions. Please call the main number to the clinic Dept: [phone] and follow the prompts.   For any non-urgent questions, you may also contact your provider using MyChart. We now offer e-Visits for anyone 14 and older to request care online for non-urgent symptoms. For details visit [email or web address].   Also download the MyChart app! Go to the app store, search MyChart, open the app, select [facility], and log in with your MyChart username and password.  Panitumumab  Injection What is this medication? PANITUMUMAB  (pan i TOOM ue mab) treats colorectal cancer. It works by blocking a protein that causes cancer cells to grow and multiply. This helps to slow or stop the spread of cancer cells. It is a monoclonal antibody. This medicine may be used for other purposes; ask your health care provider or pharmacist if you have questions. COMMON BRAND NAME(S): Vectibix  What should I tell my care team before I take this medication? They need to know if you have any of these  conditions: Eye disease Low levels of magnesium  in the blood Lung disease An unusual or allergic reaction to panitumumab , other medications, foods, dyes, or preservatives Pregnant or trying to get pregnant Breast-feeding How should I use this medication? This medication is injected into a vein. It is given by your care  team in a hospital or clinic setting. Talk to your care team about the use of this medication in children. Special care may be needed. Overdosage: If you think you have taken too much of this medicine contact a poison control center or emergency room at once. NOTE: This medicine is only for you. Do not share this medicine with others. What if I miss a dose? Keep appointments for follow-up doses. It is important not to miss your dose. Call your care team if you are unable to keep an appointment. What may interact with this medication? Bevacizumab This list may not describe all possible interactions. Give your health care provider a list of all the medicines, herbs, non-prescription drugs, or dietary supplements you use. Also tell them if you smoke, drink alcohol, or use illegal drugs. Some items may interact with your medicine. What should I watch for while using this medication? Your condition will be monitored carefully while you are receiving this medication. This medication may make you feel generally unwell. This is not uncommon as chemotherapy can affect healthy cells as well as cancer cells. Report any side effects. Continue your course of treatment even though you feel ill unless your care team tells you to stop. This medication can make you more sensitive to the sun. Keep out of the sun while receiving this medication and for 2 months after stopping therapy. If you cannot avoid being in the sun, wear protective clothing and sunscreen. Do not use sun lamps, tanning beds, or tanning booths. Check with your care team if you have severe diarrhea, nausea, and vomiting or if you sweat a lot. The loss of too much body fluid may make it dangerous for you to take this medication. This medication may cause serious skin reactions. They can happen weeks to months after starting the medication. Contact your care team right away if you notice fevers or flu-like symptoms with a rash. The rash may be red or  purple and then turn into blisters or peeling of the skin. You may also notice a red rash with swelling of the face, lips, or lymph nodes in your neck or under your arms. Talk to your care team if you may be pregnant. Serious birth defects can occur if you take this medication during pregnancy and for 2 months after the last dose. Contraception is recommended while taking this medication and for 2 months after the last dose. Your care team can help you find the option that works for you. Do not breastfeed while taking this medication and for 2 months after the last dose. This medication may cause infertility. Talk to your care team if you are concerned about your fertility. What side effects may I notice from receiving this medication? Side effects that you should report to your care team as soon as possible: Allergic reactions--skin rash, itching, hives, swelling of the face, lips, tongue, or throat Dry cough, shortness of breath or trouble breathing Eye pain, redness, irritation, or discharge with blurry or decreased vision Infusion reactions--chest pain, shortness of breath or trouble breathing, feeling faint or lightheaded Low magnesium  level--muscle pain or cramps, unusual weakness or fatigue, fast or irregular heartbeat, tremors Low potassium level--muscle pain  or cramps, unusual weakness or fatigue, fast or irregular heartbeat, constipation Redness, blistering, peeling, or loosening of the skin, including inside the mouth Skin reactions on sun-exposed areas Side effects that usually do not require medical attention (report to your care team if they continue or are bothersome): Change in nail shape, thickness, or color Diarrhea Dry skin Fatigue Nausea Vomiting This list may not describe all possible side effects. Call your doctor for medical advice about side effects. You may report side effects to FDA at 1-800-FDA-1088. Where should I keep my medication? This medication is given in a  hospital or clinic. It will not be stored at home. NOTE: This sheet is a summary. It may not cover all possible information. If you have questions about this medicine, talk to your doctor, pharmacist, or health care provider.  2024 Elsevier/Gold Standard (2021-11-22 00:00:00) Leucovorin  Injection What is this medication? LEUCOVORIN  (loo koe VOR in) prevents side effects from certain medications, such as methotrexate. It works by increasing folate levels. This helps protect healthy cells in your body. It may also be used to treat anemia caused by low levels of folate. It can also be used with fluorouracil , a type of chemotherapy, to treat colorectal cancer. It works by increasing the effects of fluorouracil  in the body. This medicine may be used for other purposes; ask your health care provider or pharmacist if you have questions. What should I tell my care team before I take this medication? They need to know if you have any of these conditions: Anemia from low levels of vitamin B12 in the blood An unusual or allergic reaction to leucovorin , folic acid, other medications, foods, dyes, or preservatives Pregnant or trying to get pregnant Breastfeeding How should I use this medication? This medication is injected into a vein or a muscle. It is given by your care team in a hospital or clinic setting. Talk to your care team about the use of this medication in children. Special care may be needed. Overdosage: If you think you have taken too much of this medicine contact a poison control center or emergency room at once. NOTE: This medicine is only for you. Do not share this medicine with others. What if I miss a dose? Keep appointments for follow-up doses. It is important not to miss your dose. Call your care team if you are unable to keep an appointment. What may interact with this medication? Capecitabine Fluorouracil  Phenobarbital Phenytoin Primidone Trimethoprim ;sulfamethoxazole  This list  may not describe all possible interactions. Give your health care provider a list of all the medicines, herbs, non-prescription drugs, or dietary supplements you use. Also tell them if you smoke, drink alcohol, or use illegal drugs. Some items may interact with your medicine. What should I watch for while using this medication? Your condition will be monitored carefully while you are receiving this medication. This medication may increase the side effects of 5-fluorouracil . Tell your care team if you have diarrhea or mouth sores that do not get better or that get worse. What side effects may I notice from receiving this medication? Side effects that you should report to your care team as soon as possible: Allergic reactions--skin rash, itching, hives, swelling of the face, lips, tongue, or throat This list may not describe all possible side effects. Call your doctor for medical advice about side effects. You may report side effects to FDA at 1-800-FDA-1088. Where should I keep my medication? This medication is given in a hospital or clinic. It will not be  stored at home. NOTE: This sheet is a summary. It may not cover all possible information. If you have questions about this medicine, talk to your doctor, pharmacist, or health care provider.  2024 Elsevier/Gold Standard (2021-12-12 00:00:00)  Fluorouracil  Injection What is this medication? FLUOROURACIL  (flure oh YOOR a sil) treats some types of cancer. It works by slowing down the growth of cancer cells. This medicine may be used for other purposes; ask your health care provider or pharmacist if you have questions. COMMON BRAND NAME(S): Adrucil  What should I tell my care team before I take this medication? They need to know if you have any of these conditions: Blood disorders Dihydropyrimidine dehydrogenase (DPD) deficiency Infection, such as chickenpox, cold sores, herpes Kidney disease Liver disease Poor nutrition Recent or ongoing  radiation therapy An unusual or allergic reaction to fluorouracil , other medications, foods, dyes, or preservatives If you or your partner are pregnant or trying to get pregnant Breast-feeding How should I use this medication? This medication is injected into a vein. It is administered by your care team in a hospital or clinic setting. Talk to your care team about the use of this medication in children. Special care may be needed. Overdosage: If you think you have taken too much of this medicine contact a poison control center or emergency room at once. NOTE: This medicine is only for you. Do not share this medicine with others. What if I miss a dose? Keep appointments for follow-up doses. It is important not to miss your dose. Call your care team if you are unable to keep an appointment. What may interact with this medication? Do not take this medication with any of the following: Live virus vaccines This medication may also interact with the following: Medications that treat or prevent blood clots, such as warfarin, enoxaparin , dalteparin This list may not describe all possible interactions. Give your health care provider a list of all the medicines, herbs, non-prescription drugs, or dietary supplements you use. Also tell them if you smoke, drink alcohol, or use illegal drugs. Some items may interact with your medicine. What should I watch for while using this medication? Your condition will be monitored carefully while you are receiving this medication. This medication may make you feel generally unwell. This is not uncommon as chemotherapy can affect healthy cells as well as cancer cells. Report any side effects. Continue your course of treatment even though you feel ill unless your care team tells you to stop. In some cases, you may be given additional medications to help with side effects. Follow all directions for their use. This medication may increase your risk of getting an infection.  Call your care team for advice if you get a fever, chills, sore throat, or other symptoms of a cold or flu. Do not treat yourself. Try to avoid being around people who are sick. This medication may increase your risk to bruise or bleed. Call your care team if you notice any unusual bleeding. Be careful brushing or flossing your teeth or using a toothpick because you may get an infection or bleed more easily. If you have any dental work done, tell your dentist you are receiving this medication. Avoid taking medications that contain aspirin , acetaminophen , ibuprofen, naproxen, or ketoprofen unless instructed by your care team. These medications may hide a fever. Do not treat diarrhea with over the counter products. Contact your care team if you have diarrhea that lasts more than 2 days or if it is severe and watery.  This medication can make you more sensitive to the sun. Keep out of the sun. If you cannot avoid being in the sun, wear protective clothing and sunscreen. Do not use sun lamps, tanning beds, or tanning booths. Talk to your care team if you or your partner wish to become pregnant or think you might be pregnant. This medication can cause serious birth defects if taken during pregnancy and for 3 months after the last dose. A reliable form of contraception is recommended while taking this medication and for 3 months after the last dose. Talk to your care team about effective forms of contraception. Do not father a child while taking this medication and for 3 months after the last dose. Use a condom while having sex during this time period. Do not breastfeed while taking this medication. This medication may cause infertility. Talk to your care team if you are concerned about your fertility. What side effects may I notice from receiving this medication? Side effects that you should report to your care team as soon as possible: Allergic reactions--skin rash, itching, hives, swelling of the face, lips,  tongue, or throat Heart attack--pain or tightness in the chest, shoulders, arms, or jaw, nausea, shortness of breath, cold or clammy skin, feeling faint or lightheaded Heart failure--shortness of breath, swelling of the ankles, feet, or hands, sudden weight gain, unusual weakness or fatigue Heart rhythm changes--fast or irregular heartbeat, dizziness, feeling faint or lightheaded, chest pain, trouble breathing High ammonia level--unusual weakness or fatigue, confusion, loss of appetite, nausea, vomiting, seizures Infection--fever, chills, cough, sore throat, wounds that don't heal, pain or trouble when passing urine, general feeling of discomfort or being unwell Low red blood cell level--unusual weakness or fatigue, dizziness, headache, trouble breathing Pain, tingling, or numbness in the hands or feet, muscle weakness, change in vision, confusion or trouble speaking, loss of balance or coordination, trouble walking, seizures Redness, swelling, and blistering of the skin over hands and feet Severe or prolonged diarrhea Unusual bruising or bleeding Side effects that usually do not require medical attention (report to your care team if they continue or are bothersome): Dry skin Headache Increased tears Nausea Pain, redness, or swelling with sores inside the mouth or throat Sensitivity to light Vomiting This list may not describe all possible side effects. Call your doctor for medical advice about side effects. You may report side effects to FDA at 1-800-FDA-1088. Where should I keep my medication? This medication is given in a hospital or clinic. It will not be stored at home. NOTE: This sheet is a summary. It may not cover all possible information. If you have questions about this medicine, talk to your doctor, pharmacist, or health care provider.  2024 Elsevier/Gold Standard (2021-11-14 00:00:00)  The chemotherapy medication bag should finish at 46 hours, 96 hours, or 7 days. For example,  if your pump is scheduled for 46 hours and it was put on at 4:00 p.m., it should finish at 2:00 p.m. the day it is scheduled to come off regardless of your appointment time.     Estimated time to finish at 11:00 AM on Friday 01/10/2024.   If the display on your pump reads Low Volume and it is beeping, take the batteries out of the pump and come to the cancer center for it to be taken off.   If the pump alarms go off prior to the pump reading Low Volume then call [phone] and someone can assist you.  If the plunger comes out and the  chemotherapy medication is leaking out, please use your home chemo spill kit to clean up the spill. Do NOT use paper towels or other household products.  If you have problems or questions regarding your pump, please call either [phone] (24 hours a day) or the cancer center Monday-Friday 8:00 a.m.- 4:30 p.m. at the clinic number and we will assist you. If you are unable to get assistance, then go to the nearest Emergency Department and ask the staff to contact the IV team for assistance.

## 2024-01-31 ENCOUNTER — Inpatient Hospital Stay

## 2024-01-31 ENCOUNTER — Other Ambulatory Visit: Payer: Self-pay

## 2024-01-31 VITALS — BP 117/77 | HR 98 | Temp 98.0°F | Resp 18

## 2024-01-31 DIAGNOSIS — Z5112 Encounter for antineoplastic immunotherapy: Secondary | ICD-10-CM | POA: Diagnosis not present

## 2024-01-31 DIAGNOSIS — C187 Malignant neoplasm of sigmoid colon: Secondary | ICD-10-CM

## 2024-01-31 MED ORDER — HEPARIN SOD (PORK) LOCK FLUSH 100 UNIT/ML IV SOLN
500.0000 [IU] | Freq: Once | INTRAVENOUS | Status: AC | PRN
Start: 2024-01-31 — End: 2024-01-31
  Administered 2024-01-31: 500 [IU]

## 2024-01-31 MED ORDER — SODIUM CHLORIDE 0.9% FLUSH
10.0000 mL | INTRAVENOUS | Status: DC | PRN
  Administered 2024-01-31: 10 mL

## 2024-01-31 NOTE — Patient Instructions (Signed)
 [facility] - A DEPT OF [facility]  Discharge Instructions: Thank you for choosing [facility] to provide your oncology and hematology care.   If you have a lab appointment with the Cancer Center, please go directly to the Cancer Center and check in at the registration area.   Wear comfortable clothing and clothing appropriate for easy access to any Portacath or PICC line.   We strive to give you quality time with your provider. You may need to reschedule your appointment if you arrive late (15 or more minutes).  Arriving late affects you and other patients whose appointments are after yours.  Also, if you miss three or more appointments without notifying the office, you may be dismissed from the clinic at the provider's discretion.      For prescription refill requests, have your pharmacy contact our office and allow 72 hours for refills to be completed.    Today you received the following pump stop.    To help prevent nausea and vomiting after your treatment, we encourage you to take your nausea medication as directed.  BELOW ARE SYMPTOMS THAT SHOULD BE REPORTED IMMEDIATELY: *FEVER GREATER THAN 100.4 F (38 C) OR HIGHER *CHILLS OR SWEATING *NAUSEA AND VOMITING THAT IS NOT CONTROLLED WITH YOUR NAUSEA MEDICATION *UNUSUAL SHORTNESS OF BREATH *UNUSUAL BRUISING OR BLEEDING *URINARY PROBLEMS (pain or burning when urinating, or frequent urination) *BOWEL PROBLEMS (unusual diarrhea, constipation, pain near the anus) TENDERNESS IN MOUTH AND THROAT WITH OR WITHOUT PRESENCE OF ULCERS (sore throat, sores in mouth, or a toothache) UNUSUAL RASH, SWELLING OR PAIN  UNUSUAL VAGINAL DISCHARGE OR ITCHING   Items with * indicate a potential emergency and should be followed up as soon as possible or go to the Emergency Department if any problems should occur.  Please show the CHEMOTHERAPY ALERT CARD or IMMUNOTHERAPY ALERT CARD at check-in to the Emergency  Department and triage nurse.  Should you have questions after your visit or need to cancel or reschedule your appointment, please contact [facility]  Dept: [phone]  and follow the prompts.  Office hours are 8:00 a.m. to 4:30 p.m. Monday - Friday. Please note that voicemails left after 4:00 p.m. may not be returned until the following business day.  We are closed weekends and major holidays. You have access to a nurse at all times for urgent questions. Please call the main number to the clinic Dept: [phone] and follow the prompts.   For any non-urgent questions, you may also contact your provider using MyChart. We now offer e-Visits for anyone 72 and older to request care online for non-urgent symptoms. For details visit [email or web address].   Also download the MyChart app! Go to the app store, search "MyChart", open the app, select [place], and log in with your MyChart username and password.

## 2024-02-08 ENCOUNTER — Other Ambulatory Visit: Payer: Self-pay | Admitting: Oncology

## 2024-02-11 MED ORDER — FLUOROURACIL CHEMO INJECTION 2.5 GM/50ML
400.0000 mg/m2 | Freq: Once | INTRAVENOUS | Status: AC
  Administered 2024-02-12: 850 mg via INTRAVENOUS
  Filled 2024-02-11: qty 17

## 2024-02-11 MED ORDER — SODIUM CHLORIDE 0.9 % IV SOLN
2400.0000 mg/m2 | INTRAVENOUS | Status: DC
  Administered 2024-02-12: 5000 mg via INTRAVENOUS
  Filled 2024-02-11: qty 100

## 2024-02-12 ENCOUNTER — Inpatient Hospital Stay (HOSPITAL_BASED_OUTPATIENT_CLINIC_OR_DEPARTMENT_OTHER): Admitting: Oncology

## 2024-02-12 ENCOUNTER — Inpatient Hospital Stay

## 2024-02-12 VITALS — BP 106/80 | HR 95 | Temp 98.1°F | Resp 18 | Ht 66.0 in | Wt 220.7 lb

## 2024-02-12 DIAGNOSIS — C187 Malignant neoplasm of sigmoid colon: Secondary | ICD-10-CM

## 2024-02-12 DIAGNOSIS — Z5112 Encounter for antineoplastic immunotherapy: Secondary | ICD-10-CM | POA: Diagnosis not present

## 2024-02-12 LAB — CBC WITH DIFFERENTIAL (CANCER CENTER ONLY)
Abs Immature Granulocytes: 0.04 K/uL (ref 0.00–0.07)
Basophils Absolute: 0.1 K/uL (ref 0.0–0.1)
Basophils Relative: 1 %
Eosinophils Absolute: 0.6 K/uL — ABNORMAL HIGH (ref 0.0–0.5)
Eosinophils Relative: 6 %
HCT: 47 % (ref 39.0–52.0)
Hemoglobin: 15.9 g/dL (ref 13.0–17.0)
Immature Granulocytes: 0 %
Lymphocytes Relative: 18 %
Lymphs Abs: 1.8 K/uL (ref 0.7–4.0)
MCH: 32.3 pg (ref 26.0–34.0)
MCHC: 33.8 g/dL (ref 30.0–36.0)
MCV: 95.5 fL (ref 80.0–100.0)
Monocytes Absolute: 0.8 K/uL (ref 0.1–1.0)
Monocytes Relative: 8 %
Neutro Abs: 6.9 K/uL (ref 1.7–7.7)
Neutrophils Relative %: 67 %
Platelet Count: 144 K/uL — ABNORMAL LOW (ref 150–400)
RBC: 4.92 MIL/uL (ref 4.22–5.81)
RDW: 14.5 % (ref 11.5–15.5)
WBC Count: 10.3 K/uL (ref 4.0–10.5)
nRBC: 0 % (ref 0.0–0.2)

## 2024-02-12 LAB — CMP (CANCER CENTER ONLY)
ALT: 28 U/L (ref 0–44)
AST: 30 U/L (ref 15–41)
Albumin: 4 g/dL (ref 3.5–5.0)
Alkaline Phosphatase: 123 U/L (ref 38–126)
Anion gap: 11 (ref 5–15)
BUN: 12 mg/dL (ref 6–20)
CO2: 26 mmol/L (ref 22–32)
Calcium: 8.9 mg/dL (ref 8.9–10.3)
Chloride: 105 mmol/L (ref 98–111)
Creatinine: 0.78 mg/dL (ref 0.61–1.24)
GFR, Estimated: >60 mL/min (ref >60)
Glucose, Bld: 137 mg/dL — ABNORMAL HIGH (ref 70–99)
Potassium: 3.8 mmol/L (ref 3.5–5.1)
Sodium: 142 mmol/L (ref 135–145)
Total Bilirubin: 0.5 mg/dL (ref 0.0–1.2)
Total Protein: 7.3 g/dL (ref 6.5–8.1)

## 2024-02-12 LAB — CEA (ACCESS): CEA (CHCC): 10.35 ng/mL — ABNORMAL HIGH (ref 0.00–5.00)

## 2024-02-12 LAB — MAGNESIUM: Magnesium: 1.6 mg/dL — ABNORMAL LOW (ref 1.7–2.4)

## 2024-02-12 MED ORDER — LEUCOVORIN CALCIUM INJECTION 350 MG
400.0000 mg/m2 | Freq: Once | INTRAVENOUS | Status: AC
  Administered 2024-02-12: 836 mg via INTRAVENOUS
  Filled 2024-02-12: qty 41.8

## 2024-02-12 MED ORDER — SODIUM CHLORIDE 0.9 % IV SOLN
6.0000 mg/kg | Freq: Once | INTRAVENOUS | Status: AC
  Administered 2024-02-12: 600 mg via INTRAVENOUS
  Filled 2024-02-12: qty 20

## 2024-02-12 MED ORDER — SODIUM CHLORIDE 0.9 % IV SOLN
Freq: Once | INTRAVENOUS | Status: AC
Start: 2024-02-12 — End: 2024-02-12

## 2024-02-12 NOTE — Progress Notes (Signed)
 [facility] OFFICE PROGRESS NOTE   Diagnosis: Colon cancer  INTERVAL HISTORY:   [name] completed on cycle of 5-FU/panitumumab  on 01/29/2024.  No nausea/vomiting, mouth sores, or diarrhea.  He has mild numbness in the fingers.  The skin rash is stable.  The paronychia at his toe has healed.  No new complaint.  Objective:  Vital signs in last 24 hours:  Blood pressure 106/80, pulse 95, temperature 98.1 F (36.7 C), temperature source Temporal, resp. rate 18, height 5' 6 (1.676 m), weight 220 lb 11.2 oz (100.1 kg), SpO2 100%.    HEENT: No thrush or ulcers Resp: Lungs clear bilaterally Cardio: Regular rate and rhythm GI: No hepatosplenomegaly Vascular: No leg edema  Skin: Acne type rash over the face and trunk, 1 area of paronychia over the hand, facial erythema  Portacath/PICC-without erythema  Lab Results:  Lab Results  Component Value Date   WBC 10.3 02/12/2024   HGB 15.9 02/12/2024   HCT 47.0 02/12/2024   MCV 95.5 02/12/2024   PLT 144 (L) 02/12/2024   NEUTROABS 6.9 02/12/2024    CMP  Lab Results  Component Value Date   NA 142 02/12/2024   K 3.8 02/12/2024   CL 105 02/12/2024   CO2 26 02/12/2024   GLUCOSE 137 (H) 02/12/2024   BUN 12 02/12/2024   CREATININE 0.78 02/12/2024   CALCIUM  8.9 02/12/2024   PROT 7.3 02/12/2024   ALBUMIN 4.0 02/12/2024   AST 30 02/12/2024   ALT 28 02/12/2024   ALKPHOS 123 02/12/2024   BILITOT 0.5 02/12/2024   GFRNONAA >60 02/12/2024   GFRAA >60 07/23/2016    Lab Results  Component Value Date   CEA 10.35 (H) 02/12/2024    Lab Results  Component Value Date   INR 1.1 01/22/2023   LABPROT 14.1 01/22/2023    Imaging:  No results found.  Medications: I have reviewed the patient's current medications.   Assessment/Plan: Sigmoid colon mass, multiple liver lesions Ultrasound abdomen 01/08/2023-multifocal hyperechoic liver lesions, largest measuring 8.7 cm in the right hepatic lobe CT abdomen/pelvis  01/08/2023-multiple by lobar hypoattenuating hepatic lesions, masslike thickening of the sigmoid colon with abnormal spiculated pericolonic soft tissue.  IMA lymphadenopathy.  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 Colonoscopy 01/29/2023-malignant tumor in the sigmoid colon (invasive moderately differentiated adenocarcinoma), four 6-9 mm polyps in the rectum, ascending colon and cecum, one 14 mm polyp in the sigmoid colon (within the rectal polyp biopsies there are 2 similar-appearing fragments showing invasive tumor with associated adenomatous change which is felt to be carryover from the sigmoid mass biopsies, the rectal polyp biopsies also show multiple fragments of tubular adenoma without high-grade dysplasia) Cycle 1 FOLFOX 01/30/2023 Cycle 2 FOLFOX 02/12/2023 Cycle 3 FOLFOX plus Panitumumab  02/27/2023 Cycle 4 FOLFOX plus Panitumumab  03/13/2023 Cycle 5 FOLFOX plus panitumumab  03/27/2023 Cycle 6 FOLFOX plus Panitumumab  04/10/2023 CTs 04/19/2023-decrease size of hepatic metastases, no new lesions, decreased right upper quadrant and retroperitoneal lymph nodes, sigmoid colonic wall thickening no longer seen with a decrease in sigmoid mesenteric lymph nodes Cycle 7 FOLFOX plus panitumumab  04/24/2023, oxaliplatin  dose reduced secondary to thrombocytopenia and neuropathy Cycle 8 FOLFOX plus Panitumumab  05/08/2023 Cycle 9 FOLFOX plus Panitumumab  05/22/2023 Cycle 10 FOLFOX plus Panitumumab  06/05/2023 Cycle 11 FOLFOX plus panitumumab  06/19/2023 Cycle 12 FOLFOX plus Panitumumab  07/03/2023, oxaliplatin  held due to thrombocytopenia CTs 07/11/2023: Decrease in  hepatic metastases, no enlarged abdominal or pelvic nodes, no metastatic disease to the chest Cycle 13 of  5-FU/panitumumab  07/23/2023 Cycle 14 5-FU/Panitumumab  08/07/2023 Cycle 15 5-FU/panitumumab  08/21/2023 Cycle 16 5-FU/Panitumumab  09/04/2023 Cycle 17 5-FU/panitumumab  09/18/2023 Cycle 18 5-FU/Panitumumab  10/02/2023 Cycle 19 5-FU/Panitumumab  10/16/2023 CTs 10/24/2023: Decrease in hepatic metastases, no evidence of disease progression Cycle 20 5-FU/panitumumab  10/30/2023 Cycle 21 5-FU/Panitumumab  11/13/2023 Cycle 22 5-FU/Panitumumab  11/27/2023 Cycle 23 5-FU/panitumumab  12/11/2023 Cycle 24 5-FU/Panitumumab  12/25/2023 Cycle 25 5-FU/panitumumab  01/08/2024 CT 01/22/2024: Stable liver metastases, no evidence of disease progression Cycle 26 5-FU/panitumumab  01/29/2024 Cycle 27 5-FU/panitumumab  02/12/2024 Diabetes Cardiomyopathy Hypertension Hypercholesterolemia Weight loss secondary to Mounjaro, metastatic carcinoma? 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  He will pleat another cycle of 5-FU/panitumumab  today.  He will return for an office visit and chemotherapy in 2 weeks.  Arley Hof, MD  02/12/2024  9:36 AM

## 2024-02-12 NOTE — Progress Notes (Signed)
 Patient seen by Dr. Arley Hof today  Vitals are within treatment parameters:Yes   Labs are within treatment parameters: Yes OK to proceed w/Mg+ 1.6  Treatment plan has been signed: Yes   Per physician team, Patient is ready for treatment and there are NO modifications to the treatment plan.

## 2024-02-13 ENCOUNTER — Other Ambulatory Visit: Payer: Self-pay

## 2024-02-14 ENCOUNTER — Inpatient Hospital Stay

## 2024-02-14 VITALS — BP 109/75 | HR 94 | Temp 98.1°F | Resp 18

## 2024-02-14 DIAGNOSIS — C187 Malignant neoplasm of sigmoid colon: Secondary | ICD-10-CM

## 2024-02-14 DIAGNOSIS — Z5112 Encounter for antineoplastic immunotherapy: Secondary | ICD-10-CM | POA: Diagnosis not present

## 2024-02-14 MED ORDER — SODIUM CHLORIDE 0.9% FLUSH
10.0000 mL | INTRAVENOUS | Status: DC | PRN
Start: 2024-02-14 — End: 2024-02-14
  Administered 2024-02-14: 10 mL

## 2024-02-14 MED ORDER — HEPARIN SOD (PORK) LOCK FLUSH 100 UNIT/ML IV SOLN
500.0000 [IU] | Freq: Once | INTRAVENOUS | Status: AC | PRN
  Administered 2024-02-14: 500 [IU]

## 2024-02-25 MED ORDER — SODIUM CHLORIDE 0.9 % IV SOLN
2400.0000 mg/m2 | INTRAVENOUS | Status: DC
  Filled 2024-02-25: qty 100

## 2024-02-25 MED ORDER — FLUOROURACIL CHEMO INJECTION 2.5 GM/50ML
400.0000 mg/m2 | Freq: Once | INTRAVENOUS | Status: AC
  Administered 2024-02-26: 850 mg via INTRAVENOUS
  Filled 2024-02-25: qty 10

## 2024-02-25 MED ORDER — SODIUM CHLORIDE 0.9 % IV SOLN
2400.0000 mg/m2 | INTRAVENOUS | Status: DC
  Administered 2024-02-26: 5000 mg via INTRAVENOUS
  Filled 2024-02-25: qty 100

## 2024-02-26 ENCOUNTER — Inpatient Hospital Stay: Attending: Oncology

## 2024-02-26 ENCOUNTER — Inpatient Hospital Stay (HOSPITAL_BASED_OUTPATIENT_CLINIC_OR_DEPARTMENT_OTHER): Admitting: Oncology

## 2024-02-26 ENCOUNTER — Inpatient Hospital Stay

## 2024-02-26 VITALS — BP 104/77 | HR 99 | Temp 97.7°F | Resp 18 | Ht 66.0 in | Wt 222.1 lb

## 2024-02-26 DIAGNOSIS — C187 Malignant neoplasm of sigmoid colon: Secondary | ICD-10-CM | POA: Diagnosis not present

## 2024-02-26 DIAGNOSIS — C787 Secondary malignant neoplasm of liver and intrahepatic bile duct: Secondary | ICD-10-CM | POA: Insufficient documentation

## 2024-02-26 DIAGNOSIS — Z5112 Encounter for antineoplastic immunotherapy: Secondary | ICD-10-CM | POA: Insufficient documentation

## 2024-02-26 DIAGNOSIS — Z5111 Encounter for antineoplastic chemotherapy: Secondary | ICD-10-CM | POA: Diagnosis present

## 2024-02-26 DIAGNOSIS — Z79631 Long term (current) use of antimetabolite agent: Secondary | ICD-10-CM | POA: Insufficient documentation

## 2024-02-26 LAB — CBC WITH DIFFERENTIAL (CANCER CENTER ONLY)
Abs Immature Granulocytes: 0.04 K/uL (ref 0.00–0.07)
Basophils Absolute: 0.1 K/uL (ref 0.0–0.1)
Basophils Relative: 1 %
Eosinophils Absolute: 0.5 K/uL (ref 0.0–0.5)
Eosinophils Relative: 5 %
HCT: 46 % (ref 39.0–52.0)
Hemoglobin: 15.8 g/dL (ref 13.0–17.0)
Immature Granulocytes: 0 %
Lymphocytes Relative: 19 %
Lymphs Abs: 1.9 K/uL (ref 0.7–4.0)
MCH: 32.2 pg (ref 26.0–34.0)
MCHC: 34.3 g/dL (ref 30.0–36.0)
MCV: 93.9 fL (ref 80.0–100.0)
Monocytes Absolute: 1 K/uL (ref 0.1–1.0)
Monocytes Relative: 10 %
Neutro Abs: 6.8 K/uL (ref 1.7–7.7)
Neutrophils Relative %: 65 %
Platelet Count: 118 K/uL — ABNORMAL LOW (ref 150–400)
RBC: 4.9 MIL/uL (ref 4.22–5.81)
RDW: 14.5 % (ref 11.5–15.5)
WBC Count: 10.3 K/uL (ref 4.0–10.5)
nRBC: 0 % (ref 0.0–0.2)

## 2024-02-26 LAB — CMP (CANCER CENTER ONLY)
ALT: 22 U/L (ref 0–44)
AST: 27 U/L (ref 15–41)
Albumin: 4 g/dL (ref 3.5–5.0)
Alkaline Phosphatase: 126 U/L (ref 38–126)
Anion gap: 10 (ref 5–15)
BUN: 13 mg/dL (ref 6–20)
CO2: 26 mmol/L (ref 22–32)
Calcium: 9.4 mg/dL (ref 8.9–10.3)
Chloride: 105 mmol/L (ref 98–111)
Creatinine: 0.71 mg/dL (ref 0.61–1.24)
GFR, Estimated: >60 mL/min (ref >60)
Glucose, Bld: 102 mg/dL — ABNORMAL HIGH (ref 70–99)
Potassium: 4 mmol/L (ref 3.5–5.1)
Sodium: 141 mmol/L (ref 135–145)
Total Bilirubin: 0.6 mg/dL (ref 0.0–1.2)
Total Protein: 7 g/dL (ref 6.5–8.1)

## 2024-02-26 LAB — CEA (ACCESS): CEA (CHCC): 12.6 ng/mL — ABNORMAL HIGH (ref 0.00–5.00)

## 2024-02-26 LAB — MAGNESIUM: Magnesium: 1.7 mg/dL (ref 1.7–2.4)

## 2024-02-26 MED ORDER — SODIUM CHLORIDE 0.9% FLUSH: 10.0000 mL | INTRAVENOUS | Status: DC | PRN

## 2024-02-26 MED ORDER — DEXTROSE 5 % IV SOLN
Freq: Once | INTRAVENOUS | Status: DC
Start: 2024-02-26 — End: 2024-02-26

## 2024-02-26 MED ORDER — SODIUM CHLORIDE 0.9 % IV SOLN
6.0000 mg/kg | Freq: Once | INTRAVENOUS | Status: AC
  Administered 2024-02-26: 600 mg via INTRAVENOUS
  Filled 2024-02-26: qty 20

## 2024-02-26 MED ORDER — SODIUM CHLORIDE 0.9 % IV SOLN
400.0000 mg/m2 | Freq: Once | INTRAVENOUS | Status: AC
  Administered 2024-02-26: 836 mg via INTRAVENOUS
  Filled 2024-02-26: qty 41.8

## 2024-02-26 MED ORDER — SODIUM CHLORIDE 0.9 % IV SOLN
Freq: Once | INTRAVENOUS | Status: AC
Start: 2024-02-26 — End: 2024-02-26

## 2024-02-26 NOTE — Patient Instructions (Signed)

## 2024-02-26 NOTE — Progress Notes (Signed)
 [facility] OFFICE PROGRESS NOTE   Diagnosis: Colon cancer  INTERVAL HISTORY:   Daniel [name] completed another cycle of 5-FU/panitumumab  on 02/12/2024.  No new complaint.  The skin rash is stable.  Good appetite.  He feels well.  Objective:  Vital signs in last 24 hours:  Blood pressure 104/77, pulse 99, temperature 97.7 F (36.5 C), temperature source Temporal, resp. rate 18, height 5' 6 (1.676 m), weight 222 lb 1.6 oz (100.7 kg), SpO2 100%.    HEENT: No thrush or ulcers Resp: Lungs clear bilaterally Cardio: Regular rate and rhythm GI: No hepatosplenomegaly Vascular: No leg edema Skin: Acne type rash over the trunk, few areas of linear ulceration at the hands  Portacath/PICC-without erythema  Lab Results:  Lab Results  Component Value Date   WBC 10.3 02/26/2024   HGB 15.8 02/26/2024   HCT 46.0 02/26/2024   MCV 93.9 02/26/2024   PLT 118 (L) 02/26/2024   NEUTROABS 6.8 02/26/2024    CMP  Lab Results  Component Value Date   NA 141 02/26/2024   K 4.0 02/26/2024   CL 105 02/26/2024   CO2 26 02/26/2024   GLUCOSE 102 (H) 02/26/2024   BUN 13 02/26/2024   CREATININE 0.71 02/26/2024   CALCIUM  9.4 02/26/2024   PROT 7.0 02/26/2024   ALBUMIN 4.0 02/26/2024   AST 27 02/26/2024   ALT 22 02/26/2024   ALKPHOS 126 02/26/2024   BILITOT 0.6 02/26/2024   GFRNONAA >60 02/26/2024   GFRAA >60 07/23/2016    Lab Results  Component Value Date   CEA 10.35 (H) 02/12/2024     No results found.  Medications: I have reviewed the patient's current medications.   Assessment/Plan: Sigmoid colon mass, multiple liver lesions Ultrasound abdomen 01/08/2023-multifocal hyperechoic liver lesions, largest measuring 8.7 cm in the right hepatic lobe CT abdomen/pelvis 01/08/2023-multiple by lobar hypoattenuating hepatic lesions, masslike thickening of the sigmoid colon with abnormal spiculated pericolonic soft tissue.  IMA lymphadenopathy.  Subcentimeter pancreatic hypodensities  without pancreatic duct dilation Biopsy liver lesion 01/22/2023-metastatic adenocarcinoma; foundation 1 microsatellite stable, tumor mutation burden 4, ERBB2 amplification, K-ras wild-type, NRAS wild-type; preserved expression of the major MMR proteins. Colonoscopy 01/29/2023-malignant tumor in the sigmoid colon (invasive moderately differentiated adenocarcinoma), four 6-9 mm polyps in the rectum, ascending colon and cecum, one 14 mm polyp in the sigmoid colon (within the rectal polyp biopsies there are 2 similar-appearing fragments showing invasive tumor with associated adenomatous change which is felt to be carryover from the sigmoid mass biopsies, the rectal polyp biopsies also show multiple fragments of tubular adenoma without high-grade dysplasia) Cycle 1 FOLFOX 01/30/2023 Cycle 2 FOLFOX 02/12/2023 Cycle 3 FOLFOX plus Panitumumab  02/27/2023 Cycle 4 FOLFOX plus Panitumumab  03/13/2023 Cycle 5 FOLFOX plus panitumumab  03/27/2023 Cycle 6 FOLFOX plus Panitumumab  04/10/2023 CTs 04/19/2023-decrease size of hepatic metastases, no new lesions, decreased right upper quadrant and retroperitoneal lymph nodes, sigmoid colonic wall thickening no longer seen with a decrease in sigmoid mesenteric lymph nodes Cycle 7 FOLFOX plus panitumumab  04/24/2023, oxaliplatin  dose reduced secondary to thrombocytopenia and neuropathy Cycle 8 FOLFOX plus Panitumumab  05/08/2023 Cycle 9 FOLFOX plus Panitumumab  05/22/2023 Cycle 10 FOLFOX plus Panitumumab  06/05/2023 Cycle 11 FOLFOX plus panitumumab  06/19/2023 Cycle 12 FOLFOX plus Panitumumab  07/03/2023, oxaliplatin  held due to thrombocytopenia CTs 07/11/2023: Decrease in hepatic metastases, no enlarged abdominal or pelvic nodes, no metastatic disease to the chest Cycle 13 of 5-FU/panitumumab  07/23/2023 Cycle 14 5-FU/Panitumumab  08/07/2023 Cycle 15 5-FU/panitumumab  08/21/2023 Cycle 16 5-FU/Panitumumab  09/04/2023 Cycle 17 5-FU/panitumumab  09/18/2023 Cycle 18 5-FU/Panitumumab  10/02/2023 Cycle  19  5-FU/Panitumumab  10/16/2023 CTs 10/24/2023: Decrease in hepatic metastases, no evidence of disease progression Cycle 20 5-FU/panitumumab  10/30/2023 Cycle 21 5-FU/Panitumumab  11/13/2023 Cycle 22 5-FU/Panitumumab  11/27/2023 Cycle 23 5-FU/panitumumab  12/11/2023 Cycle 24 5-FU/Panitumumab  12/25/2023 Cycle 25 5-FU/panitumumab  01/08/2024 CT 01/22/2024: Stable liver metastases, no evidence of disease progression Cycle 26 5-FU/panitumumab  01/29/2024 Cycle 27 5-FU/panitumumab  02/12/2024 Cycle 28 5-FU/panitumumab  02/26/2024 Diabetes Cardiomyopathy Hypertension Hypercholesterolemia Weight loss secondary to Mounjaro, metastatic carcinoma? 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 Harbaugh appears stable.  He will complete another cycle of 5-FU/panitumumab  today.  He is tolerating the treatment well.  He will return for an office visit and chemotherapy in 2 weeks.  Arley Hof, MD  02/26/2024  11:45 AM

## 2024-02-26 NOTE — Patient Instructions (Signed)
 [facility] - A DEPT OF [place]  Discharge Instructions: Thank you for choosing [facility] to provide your oncology and hematology care.   If you have a lab appointment with the Cancer Center, please go directly to the Cancer Center and check in at the registration area.   Wear comfortable clothing and clothing appropriate for easy access to any Portacath or PICC line.   We strive to give you quality time with your provider. You may need to reschedule your appointment if you arrive late (15 or more minutes).  Arriving late affects you and other patients whose appointments are after yours.  Also, if you miss three or more appointments without notifying the office, you may be dismissed from the clinic at the provider's discretion.      For prescription refill requests, have your pharmacy contact our office and allow 72 hours for refills to be completed.    Today you received the following chemotherapy and/or immunotherapy agents: fluorouracil , vectibix , leucovorin .       To help prevent nausea and vomiting after your treatment, we encourage you to take your nausea medication as directed.  BELOW ARE SYMPTOMS THAT SHOULD BE REPORTED IMMEDIATELY: *FEVER GREATER THAN 100.4 F (38 C) OR HIGHER *CHILLS OR SWEATING *NAUSEA AND VOMITING THAT IS NOT CONTROLLED WITH YOUR NAUSEA MEDICATION *UNUSUAL SHORTNESS OF BREATH *UNUSUAL BRUISING OR BLEEDING *URINARY PROBLEMS (pain or burning when urinating, or frequent urination) *BOWEL PROBLEMS (unusual diarrhea, constipation, pain near the anus) TENDERNESS IN MOUTH AND THROAT WITH OR WITHOUT PRESENCE OF ULCERS (sore throat, sores in mouth, or a toothache) UNUSUAL RASH, SWELLING OR PAIN  UNUSUAL VAGINAL DISCHARGE OR ITCHING   Items with * indicate a potential emergency and should be followed up as soon as possible or go to the Emergency Department if any problems should occur.  Please show the CHEMOTHERAPY  ALERT CARD or IMMUNOTHERAPY ALERT CARD at check-in to the Emergency Department and triage nurse.  Should you have questions after your visit or need to cancel or reschedule your appointment, please contact [facility]  Dept: [phone]  and follow the prompts.  Office hours are 8:00 a.m. to 4:30 p.m. Monday - Friday. Please note that voicemails left after 4:00 p.m. may not be returned until the following business day.  We are closed weekends and major holidays. You have access to a nurse at all times for urgent questions. Please call the main number to the clinic Dept: [phone] and follow the prompts.   For any non-urgent questions, you may also contact your provider using MyChart. We now offer e-Visits for anyone 26 and older to request care online for non-urgent symptoms. For details visit [email or web address].   Also download the MyChart app! Go to the app store, search MyChart, open the app, select [place], and log in with your MyChart username and password.

## 2024-02-26 NOTE — Progress Notes (Signed)
 Patient seen by Dr. Thornton Papas today  Vitals are within treatment parameters:Yes   Labs are within treatment parameters: Yes   Treatment plan has been signed: Yes   Per physician team, Patient is ready for treatment and there are NO modifications to the treatment plan.

## 2024-02-27 ENCOUNTER — Other Ambulatory Visit: Payer: Self-pay

## 2024-02-28 ENCOUNTER — Inpatient Hospital Stay

## 2024-02-28 ENCOUNTER — Telehealth: Payer: Self-pay

## 2024-02-28 ENCOUNTER — Telehealth: Payer: Self-pay | Admitting: *Deleted

## 2024-02-28 VITALS — BP 113/82 | HR 97 | Temp 98.4°F | Resp 18

## 2024-02-28 DIAGNOSIS — C187 Malignant neoplasm of sigmoid colon: Secondary | ICD-10-CM

## 2024-02-28 NOTE — Telephone Encounter (Signed)
 Attempt to notified the pt regarding his wife FMLA form. I faxed the form to Dr. Cloretta nurse Devere ,RN. The pt has an appointment today at [facility] and the nurse is going to give him his documents and faxed them as well if need be. No questions or concerns to be noted.

## 2024-02-28 NOTE — Telephone Encounter (Signed)
 Notified Daniel [name] that her FMLA paperwork is complete and has been sent out. Will keep hardcopy to give them on 8/20 visit.

## 2024-03-10 MED ORDER — SODIUM CHLORIDE 0.9 % IV SOLN
2400.0000 mg/m2 | INTRAVENOUS | Status: DC
  Administered 2024-03-11: 5000 mg via INTRAVENOUS
  Filled 2024-03-10: qty 100

## 2024-03-10 MED ORDER — FLUOROURACIL CHEMO INJECTION 2.5 GM/50ML
400.0000 mg/m2 | Freq: Once | INTRAVENOUS | Status: AC
  Administered 2024-03-11: 850 mg via INTRAVENOUS
  Filled 2024-03-10: qty 17

## 2024-03-11 ENCOUNTER — Inpatient Hospital Stay (HOSPITAL_BASED_OUTPATIENT_CLINIC_OR_DEPARTMENT_OTHER): Admitting: Nurse Practitioner

## 2024-03-11 ENCOUNTER — Inpatient Hospital Stay

## 2024-03-11 ENCOUNTER — Encounter: Payer: Self-pay | Admitting: Nurse Practitioner

## 2024-03-11 VITALS — BP 98/74 | HR 100 | Temp 97.8°F | Resp 18 | Ht 66.0 in | Wt 223.9 lb

## 2024-03-11 DIAGNOSIS — C187 Malignant neoplasm of sigmoid colon: Secondary | ICD-10-CM | POA: Diagnosis not present

## 2024-03-11 DIAGNOSIS — Z5112 Encounter for antineoplastic immunotherapy: Secondary | ICD-10-CM | POA: Diagnosis not present

## 2024-03-11 LAB — CMP (CANCER CENTER ONLY)
ALT: 26 U/L (ref 0–44)
AST: 29 U/L (ref 15–41)
Albumin: 3.9 g/dL (ref 3.5–5.0)
Alkaline Phosphatase: 122 U/L (ref 38–126)
Anion gap: 12 (ref 5–15)
BUN: 12 mg/dL (ref 6–20)
CO2: 25 mmol/L (ref 22–32)
Calcium: 9.1 mg/dL (ref 8.9–10.3)
Chloride: 104 mmol/L (ref 98–111)
Creatinine: 0.77 mg/dL (ref 0.61–1.24)
GFR, Estimated: >60 mL/min (ref >60)
Glucose, Bld: 111 mg/dL — ABNORMAL HIGH (ref 70–99)
Potassium: 3.6 mmol/L (ref 3.5–5.1)
Sodium: 141 mmol/L (ref 135–145)
Total Bilirubin: 0.6 mg/dL (ref 0.0–1.2)
Total Protein: 7 g/dL (ref 6.5–8.1)

## 2024-03-11 LAB — CBC WITH DIFFERENTIAL (CANCER CENTER ONLY)
Abs Immature Granulocytes: 0.03 K/uL (ref 0.00–0.07)
Basophils Absolute: 0.1 K/uL (ref 0.0–0.1)
Basophils Relative: 1 %
Eosinophils Absolute: 0.6 K/uL — ABNORMAL HIGH (ref 0.0–0.5)
Eosinophils Relative: 5 %
HCT: 46.3 % (ref 39.0–52.0)
Hemoglobin: 15.7 g/dL (ref 13.0–17.0)
Immature Granulocytes: 0 %
Lymphocytes Relative: 17 %
Lymphs Abs: 1.8 K/uL (ref 0.7–4.0)
MCH: 31.8 pg (ref 26.0–34.0)
MCHC: 33.9 g/dL (ref 30.0–36.0)
MCV: 93.9 fL (ref 80.0–100.0)
Monocytes Absolute: 1 K/uL (ref 0.1–1.0)
Monocytes Relative: 9 %
Neutro Abs: 7.5 K/uL (ref 1.7–7.7)
Neutrophils Relative %: 68 %
Platelet Count: 114 K/uL — ABNORMAL LOW (ref 150–400)
RBC: 4.93 MIL/uL (ref 4.22–5.81)
RDW: 14.6 % (ref 11.5–15.5)
WBC Count: 10.9 K/uL — ABNORMAL HIGH (ref 4.0–10.5)
nRBC: 0 % (ref 0.0–0.2)

## 2024-03-11 LAB — MAGNESIUM: Magnesium: 1.6 mg/dL — ABNORMAL LOW (ref 1.7–2.4)

## 2024-03-11 LAB — CEA (ACCESS): CEA (CHCC): 14.08 ng/mL — ABNORMAL HIGH (ref 0.00–5.00)

## 2024-03-11 MED ORDER — SODIUM CHLORIDE 0.9 % IV SOLN: 6.0000 mg/kg | Freq: Once | INTRAVENOUS | Status: DC

## 2024-03-11 MED ORDER — SODIUM CHLORIDE 0.9 % IV SOLN
400.0000 mg/m2 | Freq: Once | INTRAVENOUS | Status: AC
  Administered 2024-03-11: 836 mg via INTRAVENOUS
  Filled 2024-03-11: qty 41.8

## 2024-03-11 MED ORDER — DEXTROSE 5 % IV SOLN: Freq: Once | INTRAVENOUS | Status: DC

## 2024-03-11 MED ORDER — SODIUM CHLORIDE 0.9 % IV SOLN
6.0000 mg/kg | Freq: Once | INTRAVENOUS | Status: AC
  Administered 2024-03-11: 600 mg via INTRAVENOUS
  Filled 2024-03-11: qty 20

## 2024-03-11 MED ORDER — SODIUM CHLORIDE 0.9 % IV SOLN
Freq: Once | INTRAVENOUS | Status: AC
Start: 2024-03-11 — End: 2024-03-11

## 2024-03-11 NOTE — Patient Instructions (Signed)
 [facility] - A DEPT OF [place]  Discharge Instructions: Thank you for choosing [facility] to provide your oncology and hematology care.   If you have a lab appointment with the Cancer Center, please go directly to the Cancer Center and check in at the registration area.   Wear comfortable clothing and clothing appropriate for easy access to any Portacath or PICC line.   We strive to give you quality time with your provider. You may need to reschedule your appointment if you arrive late (15 or more minutes).  Arriving late affects you and other patients whose appointments are after yours.  Also, if you miss three or more appointments without notifying the office, you may be dismissed from the clinic at the provider's discretion.      For prescription refill requests, have your pharmacy contact our office and allow 72 hours for refills to be completed.    Today you received the following chemotherapy and/or immunotherapy agents: fluorouracil , vectibix , leucovorin .       To help prevent nausea and vomiting after your treatment, we encourage you to take your nausea medication as directed.  BELOW ARE SYMPTOMS THAT SHOULD BE REPORTED IMMEDIATELY: *FEVER GREATER THAN 100.4 F (38 C) OR HIGHER *CHILLS OR SWEATING *NAUSEA AND VOMITING THAT IS NOT CONTROLLED WITH YOUR NAUSEA MEDICATION *UNUSUAL SHORTNESS OF BREATH *UNUSUAL BRUISING OR BLEEDING *URINARY PROBLEMS (pain or burning when urinating, or frequent urination) *BOWEL PROBLEMS (unusual diarrhea, constipation, pain near the anus) TENDERNESS IN MOUTH AND THROAT WITH OR WITHOUT PRESENCE OF ULCERS (sore throat, sores in mouth, or a toothache) UNUSUAL RASH, SWELLING OR PAIN  UNUSUAL VAGINAL DISCHARGE OR ITCHING   Items with * indicate a potential emergency and should be followed up as soon as possible or go to the Emergency Department if any problems should occur.  Please show the CHEMOTHERAPY  ALERT CARD or IMMUNOTHERAPY ALERT CARD at check-in to the Emergency Department and triage nurse.  Should you have questions after your visit or need to cancel or reschedule your appointment, please contact [facility]  Dept: [phone]  and follow the prompts.  Office hours are 8:00 a.m. to 4:30 p.m. Monday - Friday. Please note that voicemails left after 4:00 p.m. may not be returned until the following business day.  We are closed weekends and major holidays. You have access to a nurse at all times for urgent questions. Please call the main number to the clinic Dept: [phone] and follow the prompts.   For any non-urgent questions, you may also contact your provider using MyChart. We now offer e-Visits for anyone 26 and older to request care online for non-urgent symptoms. For details visit [email or web address].   Also download the MyChart app! Go to the app store, search MyChart, open the app, select [place], and log in with your MyChart username and password.

## 2024-03-11 NOTE — Progress Notes (Signed)
 [facility] OFFICE PROGRESS NOTE   Diagnosis: Colon cancer  INTERVAL HISTORY:   [name] returns as scheduled.  He completed another cycle of 5-FU/panitumumab  02/26/2024.  He denies nausea/vomiting.  No mouth sores.  No diarrhea.  Overall feels well.  He denies abdominal pain.  He notes increased facial redness in the setting of recent sun exposure.  Rash otherwise is stable.  Persistent mild numbness in the fingertips.  Objective:  Vital signs in last 24 hours:  Blood pressure 98/74, pulse 100, temperature 97.8 F (36.6 C), temperature source Temporal, resp. rate 18, height 5' 6 (1.676 m), weight 223 lb 14.4 oz (101.6 kg), SpO2 98%.    HEENT: No thrush or ulcers. Resp: Lungs clear bilaterally. Cardio: Regular rate and rhythm. GI: No hepatosplenomegaly. Vascular: No leg edema. Skin: Stable mild acne type rash over the trunk.  Few areas of superficial linear ulceration dorsum of the hands.  Facial erythema. Port-A-Cath without erythema.  Lab Results:  Lab Results  Component Value Date   WBC 10.9 (H) 03/11/2024   HGB 15.7 03/11/2024   HCT 46.3 03/11/2024   MCV 93.9 03/11/2024   PLT 114 (L) 03/11/2024   NEUTROABS 7.5 03/11/2024    Imaging:  No results found.  Medications: I have reviewed the patient's current medications.  Assessment/Plan: Sigmoid colon mass, multiple liver lesions Ultrasound abdomen 01/08/2023-multifocal hyperechoic liver lesions, largest measuring 8.7 cm in the right hepatic lobe CT abdomen/pelvis 01/08/2023-multiple by lobar hypoattenuating hepatic lesions, masslike thickening of the sigmoid colon with abnormal spiculated pericolonic soft tissue.  IMA lymphadenopathy.  Subcentimeter pancreatic hypodensities without pancreatic duct dilation Biopsy liver lesion 01/22/2023-metastatic adenocarcinoma; foundation 1 microsatellite stable, tumor mutation burden 4, ERBB2 amplification, K-ras wild-type, NRAS wild-type; preserved expression of the  major MMR proteins. Colonoscopy 01/29/2023-malignant tumor in the sigmoid colon (invasive moderately differentiated adenocarcinoma), four 6-9 mm polyps in the rectum, ascending colon and cecum, one 14 mm polyp in the sigmoid colon (within the rectal polyp biopsies there are 2 similar-appearing fragments showing invasive tumor with associated adenomatous change which is felt to be carryover from the sigmoid mass biopsies, the rectal polyp biopsies also show multiple fragments of tubular adenoma without high-grade dysplasia) Cycle 1 FOLFOX 01/30/2023 Cycle 2 FOLFOX 02/12/2023 Cycle 3 FOLFOX plus Panitumumab  02/27/2023 Cycle 4 FOLFOX plus Panitumumab  03/13/2023 Cycle 5 FOLFOX plus panitumumab  03/27/2023 Cycle 6 FOLFOX plus Panitumumab  04/10/2023 CTs 04/19/2023-decrease size of hepatic metastases, no new lesions, decreased right upper quadrant and retroperitoneal lymph nodes, sigmoid colonic wall thickening no longer seen with a decrease in sigmoid mesenteric lymph nodes Cycle 7 FOLFOX plus panitumumab  04/24/2023, oxaliplatin  dose reduced secondary to thrombocytopenia and neuropathy Cycle 8 FOLFOX plus Panitumumab  05/08/2023 Cycle 9 FOLFOX plus Panitumumab  05/22/2023 Cycle 10 FOLFOX plus Panitumumab  06/05/2023 Cycle 11 FOLFOX plus panitumumab  06/19/2023 Cycle 12 FOLFOX plus Panitumumab  07/03/2023, oxaliplatin  held due to thrombocytopenia CTs 07/11/2023: Decrease in hepatic metastases, no enlarged abdominal or pelvic nodes, no metastatic disease to the chest Cycle 13 of 5-FU/panitumumab  07/23/2023 Cycle 14 5-FU/Panitumumab  08/07/2023 Cycle 15 5-FU/panitumumab  08/21/2023 Cycle 16 5-FU/Panitumumab  09/04/2023 Cycle 17 5-FU/panitumumab  09/18/2023 Cycle 18 5-FU/Panitumumab  10/02/2023 Cycle 19 5-FU/Panitumumab  10/16/2023 CTs 10/24/2023: Decrease in hepatic metastases, no evidence of disease progression Cycle 20 5-FU/panitumumab  10/30/2023 Cycle 21 5-FU/Panitumumab  11/13/2023 Cycle 22 5-FU/Panitumumab  11/27/2023 Cycle 23  5-FU/panitumumab  12/11/2023 Cycle 24 5-FU/Panitumumab  12/25/2023 Cycle 25 5-FU/panitumumab  01/08/2024 CT 01/22/2024: Stable liver metastases, no evidence of disease progression Cycle 26 5-FU/panitumumab  01/29/2024 Cycle 27 5-FU/panitumumab  02/12/2024 Cycle 28 5-FU/panitumumab  02/26/2024 Cycle 29 5-FU/Panitumumab  03/11/2024 Diabetes  Cardiomyopathy Hypertension Hypercholesterolemia Weight loss secondary to Mounjaro, metastatic carcinoma? 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 appears stable.  He continues 5-FU/panitumumab  every 2 weeks.  Overall tolerating treatment well.  He notes increased facial redness following recent sun exposure.  Skin rash is otherwise stable.  No clinical evidence of disease progression.  Plan to proceed with another cycle of 5-FU/Panitumumab  today as scheduled.  CBC and chemistry panel reviewed.  Labs adequate for treatment.  He will return for follow-up and treatment in 2 weeks.  We are available to see him sooner if needed.  Olam Ned ANP/GNP-BC   03/11/2024  8:43 AM

## 2024-03-11 NOTE — Addendum Note (Signed)
 Addended by: VIKTORIA WILMA SQUIBB on: 03/11/2024 03:37 PM   Modules accepted: Orders

## 2024-03-11 NOTE — Progress Notes (Signed)
 Patient seen by Olam Ned NP today  Vitals are within treatment parameters:Yes   Labs are within treatment parameters: Yes  Magnesium  1.6 okay to proceed Treatment plan has been signed: Yes   Per physician team, Patient is ready for treatment and there are NO modifications to the treatment plan.

## 2024-03-12 ENCOUNTER — Other Ambulatory Visit: Payer: Self-pay

## 2024-03-13 ENCOUNTER — Inpatient Hospital Stay

## 2024-03-16 ENCOUNTER — Other Ambulatory Visit: Payer: Self-pay | Admitting: Nurse Practitioner

## 2024-03-16 DIAGNOSIS — C187 Malignant neoplasm of sigmoid colon: Secondary | ICD-10-CM

## 2024-03-22 ENCOUNTER — Other Ambulatory Visit: Payer: Self-pay | Admitting: Oncology

## 2024-03-22 ENCOUNTER — Other Ambulatory Visit: Payer: Self-pay

## 2024-03-25 ENCOUNTER — Inpatient Hospital Stay

## 2024-03-25 ENCOUNTER — Inpatient Hospital Stay (HOSPITAL_BASED_OUTPATIENT_CLINIC_OR_DEPARTMENT_OTHER): Admitting: Oncology

## 2024-03-25 ENCOUNTER — Inpatient Hospital Stay: Attending: Oncology

## 2024-03-25 VITALS — BP 109/77 | HR 100 | Temp 98.0°F | Resp 18 | Ht 66.0 in | Wt 225.0 lb

## 2024-03-25 DIAGNOSIS — C187 Malignant neoplasm of sigmoid colon: Secondary | ICD-10-CM

## 2024-03-25 DIAGNOSIS — C787 Secondary malignant neoplasm of liver and intrahepatic bile duct: Secondary | ICD-10-CM | POA: Insufficient documentation

## 2024-03-25 DIAGNOSIS — C772 Secondary and unspecified malignant neoplasm of intra-abdominal lymph nodes: Secondary | ICD-10-CM | POA: Diagnosis not present

## 2024-03-25 DIAGNOSIS — Z79631 Long term (current) use of antimetabolite agent: Secondary | ICD-10-CM | POA: Diagnosis not present

## 2024-03-25 DIAGNOSIS — Z5112 Encounter for antineoplastic immunotherapy: Secondary | ICD-10-CM | POA: Insufficient documentation

## 2024-03-25 DIAGNOSIS — Z5111 Encounter for antineoplastic chemotherapy: Secondary | ICD-10-CM | POA: Insufficient documentation

## 2024-03-25 LAB — CMP (CANCER CENTER ONLY)
ALT: 32 U/L (ref 0–44)
AST: 36 U/L (ref 15–41)
Albumin: 4.3 g/dL (ref 3.5–5.0)
Alkaline Phosphatase: 132 U/L — ABNORMAL HIGH (ref 38–126)
Anion gap: 13 (ref 5–15)
BUN: 15 mg/dL (ref 6–20)
CO2: 25 mmol/L (ref 22–32)
Calcium: 9.4 mg/dL (ref 8.9–10.3)
Chloride: 102 mmol/L (ref 98–111)
Creatinine: 0.8 mg/dL (ref 0.61–1.24)
GFR, Estimated: >60 mL/min (ref >60)
Glucose, Bld: 154 mg/dL — ABNORMAL HIGH (ref 70–99)
Potassium: 3.8 mmol/L (ref 3.5–5.1)
Sodium: 140 mmol/L (ref 135–145)
Total Bilirubin: 0.8 mg/dL (ref 0.0–1.2)
Total Protein: 7.8 g/dL (ref 6.5–8.1)

## 2024-03-25 LAB — CBC WITH DIFFERENTIAL (CANCER CENTER ONLY)
Abs Immature Granulocytes: 0.06 K/uL (ref 0.00–0.07)
Basophils Absolute: 0.1 K/uL (ref 0.0–0.1)
Basophils Relative: 1 %
Eosinophils Absolute: 0.6 K/uL — ABNORMAL HIGH (ref 0.0–0.5)
Eosinophils Relative: 5 %
HCT: 46.8 % (ref 39.0–52.0)
Hemoglobin: 16 g/dL (ref 13.0–17.0)
Immature Granulocytes: 1 %
Lymphocytes Relative: 15 %
Lymphs Abs: 2 K/uL (ref 0.7–4.0)
MCH: 31.6 pg (ref 26.0–34.0)
MCHC: 34.2 g/dL (ref 30.0–36.0)
MCV: 92.5 fL (ref 80.0–100.0)
Monocytes Absolute: 1.1 K/uL — ABNORMAL HIGH (ref 0.1–1.0)
Monocytes Relative: 8 %
Neutro Abs: 9.4 K/uL — ABNORMAL HIGH (ref 1.7–7.7)
Neutrophils Relative %: 70 %
Platelet Count: 133 K/uL — ABNORMAL LOW (ref 150–400)
RBC: 5.06 MIL/uL (ref 4.22–5.81)
RDW: 14.7 % (ref 11.5–15.5)
WBC Count: 13.2 K/uL — ABNORMAL HIGH (ref 4.0–10.5)
nRBC: 0 % (ref 0.0–0.2)

## 2024-03-25 LAB — CEA (ACCESS): CEA (CHCC): 17.97 ng/mL — ABNORMAL HIGH (ref 0.00–5.00)

## 2024-03-25 LAB — MAGNESIUM: Magnesium: 1.5 mg/dL — ABNORMAL LOW (ref 1.7–2.4)

## 2024-03-25 MED ORDER — FLUOROURACIL CHEMO INJECTION 2.5 GM/50ML
400.0000 mg/m2 | Freq: Once | INTRAVENOUS | Status: AC
  Administered 2024-03-25: 850 mg via INTRAVENOUS
  Filled 2024-03-25: qty 17

## 2024-03-25 MED ORDER — SODIUM CHLORIDE 0.9 % IV SOLN
400.0000 mg/m2 | Freq: Once | INTRAVENOUS | Status: AC
  Administered 2024-03-25: 836 mg via INTRAVENOUS
  Filled 2024-03-25: qty 41.8

## 2024-03-25 MED ORDER — SODIUM CHLORIDE 0.9 % IV SOLN
6.0000 mg/kg | Freq: Once | INTRAVENOUS | Status: AC
  Administered 2024-03-25: 600 mg via INTRAVENOUS
  Filled 2024-03-25: qty 20

## 2024-03-25 MED ORDER — SODIUM CHLORIDE 0.9 % IV SOLN
2400.0000 mg/m2 | INTRAVENOUS | Status: DC
  Administered 2024-03-25: 5000 mg via INTRAVENOUS
  Filled 2024-03-25: qty 100

## 2024-03-25 MED ORDER — SODIUM CHLORIDE 0.9 % IV SOLN: Freq: Once | INTRAVENOUS | Status: AC

## 2024-03-25 MED ORDER — MAGNESIUM SULFATE 2 GM/50ML IV SOLN
2.0000 g | Freq: Once | INTRAVENOUS | Status: AC
  Administered 2024-03-25: 2 g via INTRAVENOUS
  Filled 2024-03-25: qty 50

## 2024-03-25 NOTE — Progress Notes (Signed)
 Patient seen by Dr. Arley Hof today  Vitals are within treatment parameters:Yes   Labs are within treatment parameters: No (Please specify and give further instructions.) Mg+ 1.5  Treatment plan has been signed: Yes   Per physician team, Patient is ready for treatment and there are NO modifications to the treatment plan.

## 2024-03-25 NOTE — Patient Instructions (Signed)

## 2024-03-25 NOTE — Progress Notes (Signed)
 Magnesium  was 1.5, MD adding 2 g Magnesium  to today's treatment, given per [facility].

## 2024-03-25 NOTE — Progress Notes (Addendum)
 [facility] OFFICE PROGRESS NOTE   Diagnosis: Colon cancer  INTERVAL HISTORY:   Daniel. Kilburg completed another cycle of 5-FU/panitumumab  on 03/11/2024.  No nausea/vomiting, mouth sores, or diarrhea.  No hand or foot pain.  No significant paronychia.  Good appetite.  No complaint.  Objective:  Vital signs in last 24 hours:  Blood pressure 109/77, pulse 100, temperature 98 F (36.7 C), temperature source Temporal, resp. rate 18, height 5' 6 (1.676 m), weight 225 lb (102.1 kg), SpO2 100%.    HEENT: No thrush or ulcers Resp: Clear bilaterally Cardio: Distant heart sounds, regular rhythm GI: No hepatosplenomegaly Vascular: No leg edema  Skin: Diffuse dryness, acne type rash over the trunk, diffuse dryness with erythema over the face, small linear ulcer at the dorsum of the right hand  Portacath/PICC-without erythema  Lab Results:  Lab Results  Component Value Date   WBC 13.2 (H) 03/25/2024   HGB 16.0 03/25/2024   HCT 46.8 03/25/2024   MCV 92.5 03/25/2024   PLT 133 (L) 03/25/2024   NEUTROABS 9.4 (H) 03/25/2024    CMP  Lab Results  Component Value Date   NA 141 03/11/2024   K 3.6 03/11/2024   CL 104 03/11/2024   CO2 25 03/11/2024   GLUCOSE 111 (H) 03/11/2024   BUN 12 03/11/2024   CREATININE 0.77 03/11/2024   CALCIUM  9.1 03/11/2024   PROT 7.0 03/11/2024   ALBUMIN 3.9 03/11/2024   AST 29 03/11/2024   ALT 26 03/11/2024   ALKPHOS 122 03/11/2024   BILITOT 0.6 03/11/2024   GFRNONAA >60 03/11/2024   GFRAA >60 07/23/2016    Lab Results  Component Value Date   CEA 14.08 (H) 03/11/2024      Medications: I have reviewed the patient's current medications.   Assessment/Plan: Sigmoid colon mass, multiple liver lesions Ultrasound abdomen 01/08/2023-multifocal hyperechoic liver lesions, largest measuring 8.7 cm in the right hepatic lobe CT abdomen/pelvis 01/08/2023-multiple by lobar hypoattenuating hepatic lesions, masslike thickening of the sigmoid colon  with abnormal spiculated pericolonic soft tissue.  IMA lymphadenopathy.  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 Colonoscopy 01/29/2023-malignant tumor in the sigmoid colon (invasive moderately differentiated adenocarcinoma), four 6-9 mm polyps in the rectum, ascending colon and cecum, one 14 mm polyp in the sigmoid colon (within the rectal polyp biopsies there are 2 similar-appearing fragments showing invasive tumor with associated adenomatous change which is felt to be carryover from the sigmoid mass biopsies, the rectal polyp biopsies also show multiple fragments of tubular adenoma without high-grade dysplasia) Cycle 1 FOLFOX 01/30/2023 Cycle 2 FOLFOX 02/12/2023 Cycle 3 FOLFOX plus Panitumumab  02/27/2023 Cycle 4 FOLFOX plus Panitumumab  03/13/2023 Cycle 5 FOLFOX plus panitumumab  03/27/2023 Cycle 6 FOLFOX plus Panitumumab  04/10/2023 CTs 04/19/2023-decrease size of hepatic metastases, no new lesions, decreased right upper quadrant and retroperitoneal lymph nodes, sigmoid colonic wall thickening no longer seen with a decrease in sigmoid mesenteric lymph nodes Cycle 7 FOLFOX plus panitumumab  04/24/2023, oxaliplatin  dose reduced secondary to thrombocytopenia and neuropathy Cycle 8 FOLFOX plus Panitumumab  05/08/2023 Cycle 9 FOLFOX plus Panitumumab  05/22/2023 Cycle 10 FOLFOX plus Panitumumab  06/05/2023 Cycle 11 FOLFOX plus panitumumab  06/19/2023 Cycle 12 FOLFOX plus Panitumumab  07/03/2023, oxaliplatin  held due to thrombocytopenia CTs 07/11/2023: Decrease in hepatic metastases, no enlarged abdominal or pelvic nodes, no metastatic disease to the chest Cycle 13 of 5-FU/panitumumab  07/23/2023 Cycle 14 5-FU/Panitumumab  08/07/2023 Cycle 15  5-FU/panitumumab  08/21/2023 Cycle  16 5-FU/Panitumumab  09/04/2023 Cycle 17 5-FU/panitumumab  09/18/2023 Cycle 18 5-FU/Panitumumab  10/02/2023 Cycle 19 5-FU/Panitumumab  10/16/2023 CTs 10/24/2023: Decrease in hepatic metastases, no evidence of disease progression Cycle 20 5-FU/panitumumab  10/30/2023 Cycle 21 5-FU/Panitumumab  11/13/2023 Cycle 22 5-FU/Panitumumab  11/27/2023 Cycle 23 5-FU/panitumumab  12/11/2023 Cycle 24 5-FU/Panitumumab  12/25/2023 Cycle 25 5-FU/panitumumab  01/08/2024 CT 01/22/2024: Stable liver metastases, no evidence of disease progression Cycle 26 5-FU/panitumumab  01/29/2024 Cycle 27 5-FU/panitumumab  02/12/2024 Cycle 28 5-FU/panitumumab  02/26/2024 Cycle 29 5-FU/Panitumumab  03/11/2024 Cycle 30 5-FU/panitumumab  03/25/2024 Diabetes Cardiomyopathy Hypertension Hypercholesterolemia Weight loss secondary to Mounjaro, metastatic carcinoma? 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  He will complete another cycle of 5-FU/panitumumab  today.  We will follow-up on the CEA from today.  He will return for an office visit and 5-FU/panitumumab  in 2 weeks.  He will be scheduled for restaging CTs in early October.  Arley Hof, MD  03/25/2024  9:20 AM

## 2024-03-25 NOTE — Patient Instructions (Signed)
 [facility] - A DEPT OF [place]  Discharge Instructions: Thank you for choosing [facility] to provide your oncology and hematology care.   If you have a lab appointment with the Cancer Center, please go directly to the Cancer Center and check in at the registration area.   Wear comfortable clothing and clothing appropriate for easy access to any Portacath or PICC line.   We strive to give you quality time with your provider. You may need to reschedule your appointment if you arrive late (15 or more minutes).  Arriving late affects you and other patients whose appointments are after yours.  Also, if you miss three or more appointments without notifying the office, you may be dismissed from the clinic at the provider's discretion.      For prescription refill requests, have your pharmacy contact our office and allow 72 hours for refills to be completed.    Today you received the following chemotherapy and/or immunotherapy agents: magnesium , vectibix , leucovorin , fluorouracil        To help prevent nausea and vomiting after your treatment, we encourage you to take your nausea medication as directed.  BELOW ARE SYMPTOMS THAT SHOULD BE REPORTED IMMEDIATELY: *FEVER GREATER THAN 100.4 F (38 C) OR HIGHER *CHILLS OR SWEATING *NAUSEA AND VOMITING THAT IS NOT CONTROLLED WITH YOUR NAUSEA MEDICATION *UNUSUAL SHORTNESS OF BREATH *UNUSUAL BRUISING OR BLEEDING *URINARY PROBLEMS (pain or burning when urinating, or frequent urination) *BOWEL PROBLEMS (unusual diarrhea, constipation, pain near the anus) TENDERNESS IN MOUTH AND THROAT WITH OR WITHOUT PRESENCE OF ULCERS (sore throat, sores in mouth, or a toothache) UNUSUAL RASH, SWELLING OR PAIN  UNUSUAL VAGINAL DISCHARGE OR ITCHING   Items with * indicate a potential emergency and should be followed up as soon as possible or go to the Emergency Department if any problems should occur.  Please show the  CHEMOTHERAPY ALERT CARD or IMMUNOTHERAPY ALERT CARD at check-in to the Emergency Department and triage nurse.  Should you have questions after your visit or need to cancel or reschedule your appointment, please contact [facility]  Dept: [phone]  and follow the prompts.  Office hours are 8:00 a.m. to 4:30 p.m. Monday - Friday. Please note that voicemails left after 4:00 p.m. may not be returned until the following business day.  We are closed weekends and major holidays. You have access to a nurse at all times for urgent questions. Please call the main number to the clinic Dept: [phone] and follow the prompts.   For any non-urgent questions, you may also contact your provider using MyChart. We now offer e-Visits for anyone 33 and older to request care online for non-urgent symptoms. For details visit [email or web address].   Also download the MyChart app! Go to the app store, search MyChart, open the app, select [place], and log in with your MyChart username and password.

## 2024-03-27 ENCOUNTER — Inpatient Hospital Stay

## 2024-03-27 VITALS — BP 110/86 | HR 98 | Temp 98.0°F | Resp 18

## 2024-03-27 DIAGNOSIS — C187 Malignant neoplasm of sigmoid colon: Secondary | ICD-10-CM

## 2024-03-27 NOTE — Patient Instructions (Signed)

## 2024-04-05 ENCOUNTER — Other Ambulatory Visit: Payer: Self-pay | Admitting: Oncology

## 2024-04-05 ENCOUNTER — Other Ambulatory Visit: Payer: Self-pay

## 2024-04-05 DIAGNOSIS — C187 Malignant neoplasm of sigmoid colon: Secondary | ICD-10-CM

## 2024-04-06 ENCOUNTER — Encounter: Payer: Self-pay | Admitting: Oncology

## 2024-04-08 ENCOUNTER — Inpatient Hospital Stay

## 2024-04-08 ENCOUNTER — Inpatient Hospital Stay (HOSPITAL_BASED_OUTPATIENT_CLINIC_OR_DEPARTMENT_OTHER): Admitting: Nurse Practitioner

## 2024-04-08 ENCOUNTER — Encounter: Payer: Self-pay | Admitting: Nurse Practitioner

## 2024-04-08 ENCOUNTER — Ambulatory Visit

## 2024-04-08 VITALS — BP 105/81 | HR 86 | Temp 97.9°F | Resp 18

## 2024-04-08 VITALS — BP 104/77 | HR 100 | Temp 97.8°F | Resp 18 | Ht 66.0 in | Wt 229.8 lb

## 2024-04-08 DIAGNOSIS — C187 Malignant neoplasm of sigmoid colon: Secondary | ICD-10-CM

## 2024-04-08 DIAGNOSIS — Z5112 Encounter for antineoplastic immunotherapy: Secondary | ICD-10-CM | POA: Diagnosis not present

## 2024-04-08 LAB — CBC WITH DIFFERENTIAL (CANCER CENTER ONLY)
Abs Immature Granulocytes: 0.05 K/uL (ref 0.00–0.07)
Basophils Absolute: 0.1 K/uL (ref 0.0–0.1)
Basophils Relative: 1 %
Eosinophils Absolute: 0.9 K/uL — ABNORMAL HIGH (ref 0.0–0.5)
Eosinophils Relative: 7 %
HCT: 46 % (ref 39.0–52.0)
Hemoglobin: 15.7 g/dL (ref 13.0–17.0)
Immature Granulocytes: 0 %
Lymphocytes Relative: 15 %
Lymphs Abs: 1.9 K/uL (ref 0.7–4.0)
MCH: 31.6 pg (ref 26.0–34.0)
MCHC: 34.1 g/dL (ref 30.0–36.0)
MCV: 92.6 fL (ref 80.0–100.0)
Monocytes Absolute: 1 K/uL (ref 0.1–1.0)
Monocytes Relative: 8 %
Neutro Abs: 8.5 K/uL — ABNORMAL HIGH (ref 1.7–7.7)
Neutrophils Relative %: 69 %
Platelet Count: 132 K/uL — ABNORMAL LOW (ref 150–400)
RBC: 4.97 MIL/uL (ref 4.22–5.81)
RDW: 14.7 % (ref 11.5–15.5)
WBC Count: 12.4 K/uL — ABNORMAL HIGH (ref 4.0–10.5)
nRBC: 0 % (ref 0.0–0.2)

## 2024-04-08 LAB — CMP (CANCER CENTER ONLY)
ALT: 30 U/L (ref 0–44)
AST: 35 U/L (ref 15–41)
Albumin: 4.1 g/dL (ref 3.5–5.0)
Alkaline Phosphatase: 134 U/L — ABNORMAL HIGH (ref 38–126)
Anion gap: 12 (ref 5–15)
BUN: 11 mg/dL (ref 6–20)
CO2: 27 mmol/L (ref 22–32)
Calcium: 9.1 mg/dL (ref 8.9–10.3)
Chloride: 102 mmol/L (ref 98–111)
Creatinine: 0.74 mg/dL (ref 0.61–1.24)
GFR, Estimated: >60 mL/min (ref >60)
Glucose, Bld: 102 mg/dL — ABNORMAL HIGH (ref 70–99)
Potassium: 3.8 mmol/L (ref 3.5–5.1)
Sodium: 140 mmol/L (ref 135–145)
Total Bilirubin: 0.9 mg/dL (ref 0.0–1.2)
Total Protein: 7.2 g/dL (ref 6.5–8.1)

## 2024-04-08 LAB — CEA (ACCESS): CEA (CHCC): 18.99 ng/mL — ABNORMAL HIGH (ref 0.00–5.00)

## 2024-04-08 LAB — MAGNESIUM: Magnesium: 1.7 mg/dL (ref 1.7–2.4)

## 2024-04-08 MED ORDER — SODIUM CHLORIDE 0.9 % IV SOLN: Freq: Once | INTRAVENOUS | Status: AC

## 2024-04-08 MED ORDER — SODIUM CHLORIDE 0.9 % IV SOLN
400.0000 mg/m2 | Freq: Once | INTRAVENOUS | Status: AC
  Administered 2024-04-08: 836 mg via INTRAVENOUS
  Filled 2024-04-08: qty 41.8

## 2024-04-08 MED ORDER — FLUOROURACIL CHEMO INJECTION 2.5 GM/50ML
400.0000 mg/m2 | Freq: Once | INTRAVENOUS | Status: AC
  Administered 2024-04-08: 850 mg via INTRAVENOUS
  Filled 2024-04-08: qty 17

## 2024-04-08 MED ORDER — SODIUM CHLORIDE 0.9 % IV SOLN
2400.0000 mg/m2 | INTRAVENOUS | Status: DC
  Administered 2024-04-08: 5000 mg via INTRAVENOUS
  Filled 2024-04-08: qty 100

## 2024-04-08 MED ORDER — SODIUM CHLORIDE 0.9 % IV SOLN
6.0000 mg/kg | Freq: Once | INTRAVENOUS | Status: AC
  Administered 2024-04-08: 600 mg via INTRAVENOUS
  Filled 2024-04-08: qty 20

## 2024-04-08 NOTE — Progress Notes (Signed)
 Patient seen by Olam Ned NP today  Vitals are within treatment parameters:Yes   Labs are within treatment parameters: Yes   Treatment plan has been signed: Yes   Per physician team, Patient is ready for treatment and there are NO modifications to the treatment plan.

## 2024-04-08 NOTE — Patient Instructions (Signed)
 [facility] - A DEPT OF [place]  Discharge Instructions: Thank you for choosing [facility] to provide your oncology and hematology care.   If you have a lab appointment with the Cancer Center, please go directly to the Cancer Center and check in at the registration area.   Wear comfortable clothing and clothing appropriate for easy access to any Portacath or PICC line.   We strive to give you quality time with your provider. You may need to reschedule your appointment if you arrive late (15 or more minutes).  Arriving late affects you and other patients whose appointments are after yours.  Also, if you miss three or more appointments without notifying the office, you may be dismissed from the clinic at the provider's discretion.      For prescription refill requests, have your pharmacy contact our office and allow 72 hours for refills to be completed.    Today you received the following chemotherapy and/or immunotherapy agents: vectibix , leucovorin , fluorouracil        To help prevent nausea and vomiting after your treatment, we encourage you to take your nausea medication as directed.  BELOW ARE SYMPTOMS THAT SHOULD BE REPORTED IMMEDIATELY: *FEVER GREATER THAN 100.4 F (38 C) OR HIGHER *CHILLS OR SWEATING *NAUSEA AND VOMITING THAT IS NOT CONTROLLED WITH YOUR NAUSEA MEDICATION *UNUSUAL SHORTNESS OF BREATH *UNUSUAL BRUISING OR BLEEDING *URINARY PROBLEMS (pain or burning when urinating, or frequent urination) *BOWEL PROBLEMS (unusual diarrhea, constipation, pain near the anus) TENDERNESS IN MOUTH AND THROAT WITH OR WITHOUT PRESENCE OF ULCERS (sore throat, sores in mouth, or a toothache) UNUSUAL RASH, SWELLING OR PAIN  UNUSUAL VAGINAL DISCHARGE OR ITCHING   Items with * indicate a potential emergency and should be followed up as soon as possible or go to the Emergency Department if any problems should occur.  Please show the CHEMOTHERAPY  ALERT CARD or IMMUNOTHERAPY ALERT CARD at check-in to the Emergency Department and triage nurse.  Should you have questions after your visit or need to cancel or reschedule your appointment, please contact [facility]  Dept: [phone]  and follow the prompts.  Office hours are 8:00 a.m. to 4:30 p.m. Monday - Friday. Please note that voicemails left after 4:00 p.m. may not be returned until the following business day.  We are closed weekends and major holidays. You have access to a nurse at all times for urgent questions. Please call the main number to the clinic Dept: [phone] and follow the prompts.   For any non-urgent questions, you may also contact your provider using MyChart. We now offer e-Visits for anyone 85 and older to request care online for non-urgent symptoms. For details visit [email or web address].   Also download the MyChart app! Go to the app store, search MyChart, open the app, select [place], and log in with your MyChart username and password.

## 2024-04-08 NOTE — Progress Notes (Signed)
 [facility] OFFICE PROGRESS NOTE   Diagnosis: Colon cancer  INTERVAL HISTORY:   Daniel [name] returns as scheduled.  He completed another cycle of 5-FU/Panitumumab  03/25/2024.  He denies nausea/vomiting.  No mouth sores.  No diarrhea.  Stable skin rash.  No significant paronychia.  Continued good appetite.  Objective:  Vital signs in last 24 hours:  Blood pressure 104/77, pulse 100, temperature 97.8 F (36.6 C), temperature source Temporal, resp. rate 18, height 5' 6 (1.676 m), weight 229 lb 12.8 oz (104.2 kg), SpO2 100%.    HEENT: No thrush or ulcers. Resp: Lungs clear bilaterally. Cardio: Regular rate and rhythm. GI: No hepatosplenomegaly.  Nontender. Vascular: No leg edema. Skin: Skin in general has a dry appearance.  Acne type rash over the trunk.  Erythema/dryness over the face. Port-A-Cath without erythema.  Lab Results:  Lab Results  Component Value Date   WBC 12.4 (H) 04/08/2024   HGB 15.7 04/08/2024   HCT 46.0 04/08/2024   MCV 92.6 04/08/2024   PLT 132 (L) 04/08/2024   NEUTROABS 8.5 (H) 04/08/2024    Imaging:  No results found.  Medications: I have reviewed the patient's current medications.  Assessment/Plan: Sigmoid colon mass, multiple liver lesions Ultrasound abdomen 01/08/2023-multifocal hyperechoic liver lesions, largest measuring 8.7 cm in the right hepatic lobe CT abdomen/pelvis 01/08/2023-multiple by lobar hypoattenuating hepatic lesions, masslike thickening of the sigmoid colon with abnormal spiculated pericolonic soft tissue.  IMA lymphadenopathy.  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 Colonoscopy 01/29/2023-malignant tumor in the sigmoid colon (invasive moderately differentiated  adenocarcinoma), four 6-9 mm polyps in the rectum, ascending colon and cecum, one 14 mm polyp in the sigmoid colon (within the rectal polyp biopsies there are 2 similar-appearing fragments showing invasive tumor with associated adenomatous change which is felt to be carryover from the sigmoid mass biopsies, the rectal polyp biopsies also show multiple fragments of tubular adenoma without high-grade dysplasia) Cycle 1 FOLFOX 01/30/2023 Cycle 2 FOLFOX 02/12/2023 Cycle 3 FOLFOX plus Panitumumab  02/27/2023 Cycle 4 FOLFOX plus Panitumumab  03/13/2023 Cycle 5 FOLFOX plus panitumumab  03/27/2023 Cycle 6 FOLFOX plus Panitumumab  04/10/2023 CTs 04/19/2023-decrease size of hepatic metastases, no new lesions, decreased right upper quadrant and retroperitoneal lymph nodes, sigmoid colonic wall thickening no longer seen with a decrease in sigmoid mesenteric lymph nodes Cycle 7 FOLFOX plus panitumumab  04/24/2023, oxaliplatin  dose reduced secondary to thrombocytopenia and neuropathy Cycle 8 FOLFOX plus Panitumumab  05/08/2023 Cycle 9 FOLFOX plus Panitumumab  05/22/2023 Cycle 10 FOLFOX plus Panitumumab  06/05/2023 Cycle 11 FOLFOX plus panitumumab  06/19/2023 Cycle 12 FOLFOX plus Panitumumab  07/03/2023, oxaliplatin  held due to thrombocytopenia CTs 07/11/2023: Decrease in hepatic metastases, no enlarged abdominal or pelvic nodes, no metastatic disease to the chest Cycle 13 of 5-FU/panitumumab  07/23/2023 Cycle 14 5-FU/Panitumumab  08/07/2023 Cycle 15 5-FU/panitumumab  08/21/2023 Cycle 16 5-FU/Panitumumab  09/04/2023 Cycle 17 5-FU/panitumumab  09/18/2023 Cycle 18 5-FU/Panitumumab  10/02/2023 Cycle 19 5-FU/Panitumumab  10/16/2023 CTs 10/24/2023: Decrease in hepatic metastases, no evidence of disease progression Cycle 20 5-FU/panitumumab  10/30/2023 Cycle 21 5-FU/Panitumumab  11/13/2023 Cycle 22 5-FU/Panitumumab  11/27/2023 Cycle 23 5-FU/panitumumab  12/11/2023 Cycle 24 5-FU/Panitumumab  12/25/2023 Cycle 25 5-FU/panitumumab  01/08/2024 CT 01/22/2024:  Stable liver metastases, no evidence of disease progression Cycle 26 5-FU/panitumumab  01/29/2024 Cycle 27 5-FU/panitumumab  02/12/2024 Cycle 28 5-FU/panitumumab  02/26/2024 Cycle 29 5-FU/Panitumumab  03/11/2024 Cycle 30 5-FU/panitumumab  03/25/2024 Cycle 31 5-FU/panitumumab  04/08/2024 Diabetes Cardiomyopathy Hypertension Hypercholesterolemia Weight loss secondary to Mounjaro, metastatic carcinoma? 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  He continues 5-FU/Panitumumab , overall tolerating well.  There is no clinical evidence of disease progression.  CEA stable compared to 2 weeks ago.  Plan to proceed with 5-FU/Panitumumab  today as scheduled.  CBC and chemistry panel reviewed.  Labs adequate for treatment.  Skin rash is stable.  He will continue supportive measures.  He will return for follow-up and treatment in 2 weeks.  We are available to see him sooner if needed.    Olam Ned ANP/GNP-BC   04/08/2024  9:58 AM

## 2024-04-10 ENCOUNTER — Inpatient Hospital Stay

## 2024-04-10 VITALS — BP 109/83 | HR 100 | Temp 98.1°F | Resp 18

## 2024-04-10 DIAGNOSIS — C187 Malignant neoplasm of sigmoid colon: Secondary | ICD-10-CM

## 2024-04-10 NOTE — Patient Instructions (Signed)

## 2024-04-13 ENCOUNTER — Encounter: Payer: Self-pay | Admitting: Oncology

## 2024-04-14 ENCOUNTER — Other Ambulatory Visit: Payer: Self-pay

## 2024-04-16 ENCOUNTER — Other Ambulatory Visit: Payer: Self-pay

## 2024-04-19 ENCOUNTER — Other Ambulatory Visit: Payer: Self-pay | Admitting: Oncology

## 2024-04-19 DIAGNOSIS — C187 Malignant neoplasm of sigmoid colon: Secondary | ICD-10-CM

## 2024-04-22 ENCOUNTER — Encounter: Payer: Self-pay | Admitting: Nurse Practitioner

## 2024-04-22 ENCOUNTER — Inpatient Hospital Stay

## 2024-04-22 ENCOUNTER — Inpatient Hospital Stay: Admitting: Nurse Practitioner

## 2024-04-22 ENCOUNTER — Other Ambulatory Visit: Payer: Self-pay

## 2024-04-22 ENCOUNTER — Inpatient Hospital Stay: Attending: Oncology

## 2024-04-22 ENCOUNTER — Ambulatory Visit

## 2024-04-22 VITALS — BP 103/75 | HR 92 | Resp 18

## 2024-04-22 VITALS — BP 106/80 | HR 99 | Temp 97.9°F | Resp 18 | Ht 66.0 in | Wt 229.9 lb

## 2024-04-22 DIAGNOSIS — Z79631 Long term (current) use of antimetabolite agent: Secondary | ICD-10-CM | POA: Insufficient documentation

## 2024-04-22 DIAGNOSIS — Z5111 Encounter for antineoplastic chemotherapy: Secondary | ICD-10-CM | POA: Diagnosis present

## 2024-04-22 DIAGNOSIS — Z5112 Encounter for antineoplastic immunotherapy: Secondary | ICD-10-CM | POA: Insufficient documentation

## 2024-04-22 DIAGNOSIS — C187 Malignant neoplasm of sigmoid colon: Secondary | ICD-10-CM

## 2024-04-22 DIAGNOSIS — C787 Secondary malignant neoplasm of liver and intrahepatic bile duct: Secondary | ICD-10-CM | POA: Insufficient documentation

## 2024-04-22 LAB — CBC WITH DIFFERENTIAL (CANCER CENTER ONLY)
Abs Immature Granulocytes: 0.06 K/uL (ref 0.00–0.07)
Basophils Absolute: 0.1 K/uL (ref 0.0–0.1)
Basophils Relative: 1 %
Eosinophils Absolute: 0.9 K/uL — ABNORMAL HIGH (ref 0.0–0.5)
Eosinophils Relative: 7 %
HCT: 47 % (ref 39.0–52.0)
Hemoglobin: 15.7 g/dL (ref 13.0–17.0)
Immature Granulocytes: 1 %
Lymphocytes Relative: 16 %
Lymphs Abs: 2 K/uL (ref 0.7–4.0)
MCH: 31.4 pg (ref 26.0–34.0)
MCHC: 33.4 g/dL (ref 30.0–36.0)
MCV: 94 fL (ref 80.0–100.0)
Monocytes Absolute: 1 K/uL (ref 0.1–1.0)
Monocytes Relative: 8 %
Neutro Abs: 8.6 K/uL — ABNORMAL HIGH (ref 1.7–7.7)
Neutrophils Relative %: 67 %
Platelet Count: 135 K/uL — ABNORMAL LOW (ref 150–400)
RBC: 5 MIL/uL (ref 4.22–5.81)
RDW: 14.6 % (ref 11.5–15.5)
WBC Count: 12.5 K/uL — ABNORMAL HIGH (ref 4.0–10.5)
nRBC: 0 % (ref 0.0–0.2)

## 2024-04-22 LAB — CMP (CANCER CENTER ONLY)
ALT: 32 U/L (ref 0–44)
AST: 41 U/L (ref 15–41)
Albumin: 4 g/dL (ref 3.5–5.0)
Alkaline Phosphatase: 146 U/L — ABNORMAL HIGH (ref 38–126)
Anion gap: 11 (ref 5–15)
BUN: 11 mg/dL (ref 6–20)
CO2: 26 mmol/L (ref 22–32)
Calcium: 9.1 mg/dL (ref 8.9–10.3)
Chloride: 104 mmol/L (ref 98–111)
Creatinine: 0.76 mg/dL (ref 0.61–1.24)
GFR, Estimated: >60 mL/min (ref >60)
Glucose, Bld: 121 mg/dL — ABNORMAL HIGH (ref 70–99)
Potassium: 3.9 mmol/L (ref 3.5–5.1)
Sodium: 141 mmol/L (ref 135–145)
Total Bilirubin: 0.6 mg/dL (ref 0.0–1.2)
Total Protein: 7.6 g/dL (ref 6.5–8.1)

## 2024-04-22 LAB — MAGNESIUM: Magnesium: 1.5 mg/dL — ABNORMAL LOW (ref 1.7–2.4)

## 2024-04-22 LAB — CEA (ACCESS): CEA (CHCC): 30.04 ng/mL — ABNORMAL HIGH (ref 0.00–5.00)

## 2024-04-22 MED ORDER — SODIUM CHLORIDE 0.9 % IV SOLN
400.0000 mg/m2 | Freq: Once | INTRAVENOUS | Status: AC
  Administered 2024-04-22: 836 mg via INTRAVENOUS
  Filled 2024-04-22: qty 41.8

## 2024-04-22 MED ORDER — FLUOROURACIL CHEMO INJECTION 2.5 GM/50ML
400.0000 mg/m2 | Freq: Once | INTRAVENOUS | Status: AC
  Administered 2024-04-22: 850 mg via INTRAVENOUS
  Filled 2024-04-22: qty 17

## 2024-04-22 MED ORDER — MAGNESIUM SULFATE 2 GM/50ML IV SOLN
2.0000 g | Freq: Once | INTRAVENOUS | Status: AC
  Administered 2024-04-22: 2 g via INTRAVENOUS
  Filled 2024-04-22: qty 50

## 2024-04-22 MED ORDER — SODIUM CHLORIDE 0.9 % IV SOLN: Freq: Once | INTRAVENOUS | Status: AC

## 2024-04-22 MED ORDER — SODIUM CHLORIDE 0.9 % IV SOLN
6.0000 mg/kg | Freq: Once | INTRAVENOUS | Status: AC
  Administered 2024-04-22: 600 mg via INTRAVENOUS
  Filled 2024-04-22: qty 20

## 2024-04-22 MED ORDER — SODIUM CHLORIDE 0.9 % IV SOLN
2400.0000 mg/m2 | INTRAVENOUS | Status: DC
  Administered 2024-04-22: 5000 mg via INTRAVENOUS
  Filled 2024-04-22: qty 100

## 2024-04-22 NOTE — Progress Notes (Signed)
 [facility] OFFICE PROGRESS NOTE   Diagnosis: Colon cancer  INTERVAL HISTORY:   Daniel [name] returns as scheduled.  He completed another cycle of 5-FU/Panitumumab  04/08/2024.  He denies nausea/vomiting.  No mouth sores.  No diarrhea.  Overall stable skin rash.  Mild paronychia symptoms.  Stable numbness in the fingertips.  Objective:  Vital signs in last 24 hours:  Blood pressure 106/80, pulse 99, temperature 97.9 F (36.6 C), temperature source Temporal, resp. rate 18, height 5' 6 (1.676 m), weight 229 lb 14.4 oz (104.3 kg), SpO2 98%.    HEENT: No thrush or ulcers. Resp: Lungs clear bilaterally. Cardio: Regular rate and rhythm. GI: No hepatosplenomegaly.  Nontender. Vascular: No leg edema. Neuro: Alert and oriented Skin: Skin in general is dry.  Acne type rash over the trunk.  Erythema/dryness over the face. Port-A-Cath without erythema.  Lab Results:  Lab Results  Component Value Date   WBC 12.5 (H) 04/22/2024   HGB 15.7 04/22/2024   HCT 47.0 04/22/2024   MCV 94.0 04/22/2024   PLT 135 (L) 04/22/2024   NEUTROABS 8.6 (H) 04/22/2024    Imaging:  No results found.  Medications: I have reviewed the patient's current medications.  Assessment/Plan: Sigmoid colon mass, multiple liver lesions Ultrasound abdomen 01/08/2023-multifocal hyperechoic liver lesions, largest measuring 8.7 cm in the right hepatic lobe CT abdomen/pelvis 01/08/2023-multiple by lobar hypoattenuating hepatic lesions, masslike thickening of the sigmoid colon with abnormal spiculated pericolonic soft tissue.  IMA lymphadenopathy.  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 Colonoscopy 01/29/2023-malignant tumor in the sigmoid colon (invasive moderately  differentiated adenocarcinoma), four 6-9 mm polyps in the rectum, ascending colon and cecum, one 14 mm polyp in the sigmoid colon (within the rectal polyp biopsies there are 2 similar-appearing fragments showing invasive tumor with associated adenomatous change which is felt to be carryover from the sigmoid mass biopsies, the rectal polyp biopsies also show multiple fragments of tubular adenoma without high-grade dysplasia) Cycle 1 FOLFOX 01/30/2023 Cycle 2 FOLFOX 02/12/2023 Cycle 3 FOLFOX plus Panitumumab  02/27/2023 Cycle 4 FOLFOX plus Panitumumab  03/13/2023 Cycle 5 FOLFOX plus panitumumab  03/27/2023 Cycle 6 FOLFOX plus Panitumumab  04/10/2023 CTs 04/19/2023-decrease size of hepatic metastases, no new lesions, decreased right upper quadrant and retroperitoneal lymph nodes, sigmoid colonic wall thickening no longer seen with a decrease in sigmoid mesenteric lymph nodes Cycle 7 FOLFOX plus panitumumab  04/24/2023, oxaliplatin  dose reduced secondary to thrombocytopenia and neuropathy Cycle 8 FOLFOX plus Panitumumab  05/08/2023 Cycle 9 FOLFOX plus Panitumumab  05/22/2023 Cycle 10 FOLFOX plus Panitumumab  06/05/2023 Cycle 11 FOLFOX plus panitumumab  06/19/2023 Cycle 12 FOLFOX plus Panitumumab  07/03/2023, oxaliplatin  held due to thrombocytopenia CTs 07/11/2023: Decrease in hepatic metastases, no enlarged abdominal or pelvic nodes, no metastatic disease to the chest Cycle 13 of 5-FU/panitumumab  07/23/2023 Cycle 14 5-FU/Panitumumab  08/07/2023 Cycle 15 5-FU/panitumumab  08/21/2023 Cycle 16 5-FU/Panitumumab  09/04/2023 Cycle 17 5-FU/panitumumab  09/18/2023 Cycle 18 5-FU/Panitumumab  10/02/2023 Cycle 19 5-FU/Panitumumab  10/16/2023 CTs 10/24/2023: Decrease in hepatic metastases, no evidence of disease progression Cycle 20 5-FU/panitumumab  10/30/2023 Cycle 21 5-FU/Panitumumab  11/13/2023 Cycle 22 5-FU/Panitumumab  11/27/2023 Cycle 23 5-FU/panitumumab  12/11/2023 Cycle 24 5-FU/Panitumumab  12/25/2023 Cycle 25 5-FU/panitumumab  01/08/2024 CT  01/22/2024: Stable liver metastases, no evidence of disease progression Cycle 26 5-FU/panitumumab  01/29/2024 Cycle 27 5-FU/panitumumab  02/12/2024 Cycle 28 5-FU/panitumumab  02/26/2024 Cycle 29 5-FU/Panitumumab  03/11/2024 Cycle 30 5-FU/panitumumab  03/25/2024 Cycle 31 5-FU/panitumumab  04/08/2024 Cycle 32 5-FU/Panitumumab  04/22/2024 Diabetes Cardiomyopathy Hypertension Hypercholesterolemia Weight loss  secondary to Mounjaro, metastatic carcinoma? 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  He continues 5-FU/panitumumab  every 2 weeks, overall tolerating well.  There is no clinical evidence of disease progression.  Plan to proceed with treatment today as scheduled.  Restaging CTs prior to next office visit.  CBC and chemistry panel reviewed.  Labs adequate for treatment.  Mildly low magnesium .  He will receive 2 g IV magnesium .  He will return for follow-up and treatment in 2 weeks.  We are available to see him sooner if needed.    Olam Ned ANP/GNP-BC   04/22/2024  9:27 AM

## 2024-04-22 NOTE — Progress Notes (Signed)
 Patient seen by Olam Ned NP today  Vitals are within treatment parameters:Yes   Labs are within treatment parameters: Yes Magnesium  1.5 2g of Iv mag today or d/c day  Treatment plan has been signed: Yes   Per physician team, Patient is ready for treatment and there are NO modifications to the treatment plan.

## 2024-04-22 NOTE — Patient Instructions (Signed)
 [facility] - A DEPT OF [place]  Discharge Instructions: Thank you for choosing [facility] to provide your oncology and hematology care.   If you have a lab appointment with the Cancer Center, please go directly to the Cancer Center and check in at the registration area.   Wear comfortable clothing and clothing appropriate for easy access to any Portacath or PICC line.   We strive to give you quality time with your provider. You may need to reschedule your appointment if you arrive late (15 or more minutes).  Arriving late affects you and other patients whose appointments are after yours.  Also, if you miss three or more appointments without notifying the office, you may be dismissed from the clinic at the provider's discretion.      For prescription refill requests, have your pharmacy contact our office and allow 72 hours for refills to be completed.    Today you received the following chemotherapy and/or immunotherapy agents: vectibix , leucovorin , fluorouracil        To help prevent nausea and vomiting after your treatment, we encourage you to take your nausea medication as directed.  BELOW ARE SYMPTOMS THAT SHOULD BE REPORTED IMMEDIATELY: *FEVER GREATER THAN 100.4 F (38 C) OR HIGHER *CHILLS OR SWEATING *NAUSEA AND VOMITING THAT IS NOT CONTROLLED WITH YOUR NAUSEA MEDICATION *UNUSUAL SHORTNESS OF BREATH *UNUSUAL BRUISING OR BLEEDING *URINARY PROBLEMS (pain or burning when urinating, or frequent urination) *BOWEL PROBLEMS (unusual diarrhea, constipation, pain near the anus) TENDERNESS IN MOUTH AND THROAT WITH OR WITHOUT PRESENCE OF ULCERS (sore throat, sores in mouth, or a toothache) UNUSUAL RASH, SWELLING OR PAIN  UNUSUAL VAGINAL DISCHARGE OR ITCHING   Items with * indicate a potential emergency and should be followed up as soon as possible or go to the Emergency Department if any problems should occur.  Please show the CHEMOTHERAPY  ALERT CARD or IMMUNOTHERAPY ALERT CARD at check-in to the Emergency Department and triage nurse.  Should you have questions after your visit or need to cancel or reschedule your appointment, please contact [facility]  Dept: [phone]  and follow the prompts.  Office hours are 8:00 a.m. to 4:30 p.m. Monday - Friday. Please note that voicemails left after 4:00 p.m. may not be returned until the following business day.  We are closed weekends and major holidays. You have access to a nurse at all times for urgent questions. Please call the main number to the clinic Dept: [phone] and follow the prompts.   For any non-urgent questions, you may also contact your provider using MyChart. We now offer e-Visits for anyone 85 and older to request care online for non-urgent symptoms. For details visit [email or web address].   Also download the MyChart app! Go to the app store, search MyChart, open the app, select [place], and log in with your MyChart username and password.

## 2024-04-24 ENCOUNTER — Inpatient Hospital Stay

## 2024-04-24 VITALS — BP 119/80 | HR 100 | Temp 98.3°F | Resp 18

## 2024-04-24 DIAGNOSIS — C187 Malignant neoplasm of sigmoid colon: Secondary | ICD-10-CM

## 2024-04-24 NOTE — Patient Instructions (Signed)

## 2024-04-26 ENCOUNTER — Other Ambulatory Visit: Payer: Self-pay | Admitting: Oncology

## 2024-04-26 DIAGNOSIS — C187 Malignant neoplasm of sigmoid colon: Secondary | ICD-10-CM

## 2024-04-27 ENCOUNTER — Encounter: Payer: Self-pay | Admitting: Oncology

## 2024-04-29 ENCOUNTER — Ambulatory Visit (HOSPITAL_BASED_OUTPATIENT_CLINIC_OR_DEPARTMENT_OTHER)
Admission: RE | Admit: 2024-04-29 | Discharge: 2024-04-29 | Disposition: A | Discharge status: Home / Self Care | Source: Ambulatory Visit | Attending: Nurse Practitioner | Admitting: Nurse Practitioner

## 2024-04-29 ENCOUNTER — Encounter

## 2024-04-29 DIAGNOSIS — C187 Malignant neoplasm of sigmoid colon: Secondary | ICD-10-CM | POA: Diagnosis not present

## 2024-04-29 MED ORDER — IOHEXOL 300 MG/ML  SOLN
100.0000 mL | Freq: Once | INTRAMUSCULAR | Status: AC | PRN
Start: 2024-04-29 — End: 2024-04-29
  Administered 2024-04-29: 100 mL via INTRAVENOUS

## 2024-05-02 ENCOUNTER — Other Ambulatory Visit: Payer: Self-pay | Admitting: Oncology

## 2024-05-05 NOTE — Progress Notes (Unsigned)
 [facility]   Telephone:([phone] Fax:([phone]    Patient Care Team: Pablo Perkins, FNP as PCP - General (Family Medicine)   CHIEF COMPLAINT: Follow-up colon cancer  CURRENT THERAPY: 5-FU/panitumumab   INTERVAL HISTORY Mr. Cuccia returns for follow-up and treatment as scheduled. Doing well overall. Fingertips slightly numb, not painful, without functional difficulties. Skin remains dry and cracked at times, manages with lotions. Eating/drinking well, bowels moving, denies pain. Has mild cough from allergies.   ROS  All other systems reviewed and negative   Past Medical History:  Diagnosis Date   Asthma    Cardiomyopathy (HCC) 2015   CHF (congestive heart failure) (HCC)    Diabetes mellitus without complication (HCC)    Hypertension      Past Surgical History:  Procedure Laterality Date   CARDIAC CATHETERIZATION     IMPLANTABLE CARDIOVERTER DEFIBRILLATOR (ICD) GENERATOR CHANGE Left 07/08/2015   Procedure: ICD GENERATOR CHANGE/INITIAL IMPLANT;  Surgeon: Franky LITTIE Ned, MD;  Location: [facility] ORS;  Service: Cardiovascular;  Laterality: Left;   IR IMAGING GUIDED PORT INSERTION  01/22/2023   IR US  GUIDE BX ASP/DRAIN  01/22/2023     Outpatient Encounter Medications as of 05/06/2024  Medication Sig Note   ascorbic acid (VITAMIN C) 500 MG tablet Take 500 mg by mouth daily.    aspirin  EC 81 MG tablet Take 81 mg by mouth every morning.    BREO ELLIPTA 200-25 MCG/ACT AEPB 1 puff daily.    carvedilol  (COREG ) 12.5 MG tablet Take 1 tablet (12.5 mg total) by mouth 2 (two) times daily with a meal.    cholecalciferol (VITAMIN D ) 1000 units tablet Take 1,000 Units by mouth daily.    Continuous Glucose Receiver (DEXCOM G6 RECEIVER) DEVI One receiver G6    doxycycline  (VIBRA -TABS) 100 MG tablet Take 1 tablet by mouth twice daily    ferrous sulfate  325 (65 FE) MG EC tablet Take 1 tablet (325 mg total) by mouth daily with breakfast.    furosemide  (LASIX ) 40 MG tablet  Take 40 mg by mouth daily.     Insulin  Lispro (HUMALOG KWIKPEN [place]) Inject 6 Units into the skin in the morning and at bedtime.    Insulin  Pen Needle (PEN NEEDLES 31GX5/16) 31G X 8 MM MISC Inject 1 Units into the skin 4 (four) times daily as needed.    lisinopril  (PRINIVIL ,ZESTRIL ) 5 MG tablet Take 5 mg by mouth daily.    magic mouthwash (nystatin , diphenhydrAMINE, alum & mag hydroxide) suspension mixture Take 5 mLs (swish and spit) by mouth 4 (four) times daily as needed for mouth sores/pain.    MAGNESIUM  PO Take 500 mg by mouth 2 (two) times daily.    montelukast  (SINGULAIR ) 10 MG tablet Take 10 mg by mouth every morning.    MOUNJARO 15 MG/0.5ML Pen Inject 15 mg into the skin once a week.    potassium chloride  SA (KLOR-CON  M) 20 MEQ tablet Take 1 tablet by mouth twice daily    rosuvastatin (CRESTOR) 5 MG tablet Take 1 tablet by mouth at bedtime.    sertraline  (ZOLOFT ) 50 MG tablet Take 50 mg by mouth at bedtime.    spironolactone  (ALDACTONE ) 25 MG tablet Take 25 mg by mouth every morning.    albuterol  (PROVENTIL  HFA;VENTOLIN  HFA) 108 (90 Base) MCG/ACT inhaler Inhale 1-2 puffs into the lungs every 6 (six) hours as needed for wheezing or shortness of breath. (Patient not taking: Reported on 05/06/2024) 07/24/2016: PRN med.. Probably hasn't used in over  a month   ALPRAZolam  (XANAX ) 0.5 MG tablet Take 0.5-1 tablets (0.25-0.5 mg total) by mouth at bedtime as needed for anxiety or sleep. (Patient not taking: Reported on 05/06/2024)    fluticasone  (FLONASE ) 50 MCG/ACT nasal spray Place 1 spray into both nostrils 2 (two) times daily. (Patient not taking: Reported on 05/06/2024)    ipratropium-albuterol  (DUONEB) 0.5-2.5 (3) MG/3ML SOLN Take 3 mLs by nebulization every 4 (four) hours as needed. (Patient not taking: Reported on 05/06/2024) 02/27/2023: PRN med   methocarbamol (ROBAXIN) 500 MG tablet Take 1 tablet by mouth 3 (three) times daily. (Patient not taking: Reported on 05/06/2024) 01/25/2023: PRN    ondansetron  (ZOFRAN ) 8 MG tablet Take 1 tablet (8 mg total) by mouth every 8 (eight) hours as needed (May use starting day 4 after chemo as needed for nausea). (Patient not taking: Reported on 05/06/2024)    prochlorperazine  (COMPAZINE ) 10 MG tablet Take 1 tablet (10 mg total) by mouth every 6 (six) hours as needed for nausea or vomiting. (Patient not taking: Reported on 05/06/2024)    triamcinolone  (KENALOG ) 0.025 % ointment APPLY OINMENT TOPICALLY TO TO THE AFFECTED AREA TWICE DAILY (Patient not taking: Reported on 05/06/2024)    Facility-Administered Encounter Medications as of 05/06/2024  Medication   0.9 %  sodium chloride  infusion   ipratropium-albuterol  (DUONEB) 0.5-2.5 (3) MG/3ML nebulizer solution 3 mL     Today's Vitals   05/06/24 0945 05/06/24 1003  BP: 113/80   Pulse: 100   Resp: 18   Temp: 97.6 F (36.4 C)   TempSrc: Temporal   SpO2: 99%   Weight: 230 lb (104.3 kg)   Height: 5' 6 (1.676 m)   PainSc:  0-No pain   Body mass index is 37.12 kg/m.   ECOG PERFORMANCE STATUS: 1 - Symptomatic but completely ambulatory  PHYSICAL EXAM GENERAL:alert, no distress and comfortable SKIN: diffuse acne type rash to neck, chest, trunk, back. Palms without erythema. Healing paronychia of the right toe EYES: sclera clear NECK: without mass LYMPH:  no palpable cervical or supraclavicular lymphadenopathy  LUNGS: clear with normal breathing effort HEART: regular rate & rhythm, no lower extremity edema ABDOMEN: abdomen soft, non-tender and normal bowel sounds NEURO: alert & oriented x 3 with fluent speech, no focal motor deficits PAC without erythema    CBC    Latest Ref Rng & Units 05/06/2024    9:33 AM 04/22/2024    9:00 AM 04/08/2024    8:36 AM  CBC  WBC 4.0 - 10.5 K/uL 13.0  12.5  12.4   Hemoglobin 13.0 - 17.0 g/dL 84.4  84.2  84.2   Hematocrit 39.0 - 52.0 % 45.6  47.0  46.0   Platelets 150 - 400 K/uL 144  135  132       CMP     Latest Ref Rng & Units 05/06/2024     9:33 AM 04/22/2024    9:00 AM 04/08/2024    8:36 AM  CMP  Glucose 70 - 99 mg/dL 808  878  897   BUN 6 - 20 mg/dL 14  11  11    Creatinine 0.61 - 1.24 mg/dL 9.23  9.23  9.25   Sodium 135 - 145 mmol/L 139  141  140   Potassium 3.5 - 5.1 mmol/L 3.7  3.9  3.8   Chloride 98 - 111 mmol/L 101  104  102   CO2 22 - 32 mmol/L 25  26  27    Calcium  8.9 - 10.3 mg/dL 9.1  9.1  9.1   Total Protein 6.5 - 8.1 g/dL 7.6  7.6  7.2   Total Bilirubin 0.0 - 1.2 mg/dL 0.7  0.6  0.9   Alkaline Phos 38 - 126 U/L 143  146  134   AST 15 - 41 U/L 35  41  35   ALT 0 - 44 U/L 28  32  30       ASSESSMENT & PLAN: Sigmoid colon mass, multiple liver lesions Ultrasound abdomen 01/08/2023-multifocal hyperechoic liver lesions, largest measuring 8.7 cm in the right hepatic lobe CT abdomen/pelvis 01/08/2023-multiple by lobar hypoattenuating hepatic lesions, masslike thickening of the sigmoid colon with abnormal spiculated pericolonic soft tissue.  IMA lymphadenopathy.  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 Colonoscopy 01/29/2023-malignant tumor in the sigmoid colon (invasive moderately differentiated adenocarcinoma), four 6-9 mm polyps in the rectum, ascending colon and cecum, one 14 mm polyp in the sigmoid colon (within the rectal polyp biopsies there are 2 similar-appearing fragments showing invasive tumor with associated adenomatous change which is felt to be carryover from the sigmoid mass biopsies, the rectal polyp biopsies also show multiple fragments of tubular adenoma without high-grade dysplasia) Cycle 1 FOLFOX 01/30/2023 Cycle 2 FOLFOX 02/12/2023 Cycle 3 FOLFOX plus Panitumumab  02/27/2023 Cycle 4 FOLFOX plus Panitumumab  03/13/2023 Cycle 5 FOLFOX plus panitumumab  03/27/2023 Cycle 6  FOLFOX plus Panitumumab  04/10/2023 CTs 04/19/2023-decrease size of hepatic metastases, no new lesions, decreased right upper quadrant and retroperitoneal lymph nodes, sigmoid colonic wall thickening no longer seen with a decrease in sigmoid mesenteric lymph nodes Cycle 7 FOLFOX plus panitumumab  04/24/2023, oxaliplatin  dose reduced secondary to thrombocytopenia and neuropathy Cycle 8 FOLFOX plus Panitumumab  05/08/2023 Cycle 9 FOLFOX plus Panitumumab  05/22/2023 Cycle 10 FOLFOX plus Panitumumab  06/05/2023 Cycle 11 FOLFOX plus panitumumab  06/19/2023 Cycle 12 FOLFOX plus Panitumumab  07/03/2023, oxaliplatin  held due to thrombocytopenia CTs 07/11/2023: Decrease in hepatic metastases, no enlarged abdominal or pelvic nodes, no metastatic disease to the chest Cycle 13 of 5-FU/panitumumab  07/23/2023 Cycle 14 5-FU/Panitumumab  08/07/2023 Cycle 15 5-FU/panitumumab  08/21/2023 Cycle 16 5-FU/Panitumumab  09/04/2023 Cycle 17 5-FU/panitumumab  09/18/2023 Cycle 18 5-FU/Panitumumab  10/02/2023 Cycle 19 5-FU/Panitumumab  10/16/2023 CTs 10/24/2023: Decrease in hepatic metastases, no evidence of disease progression Cycle 20 5-FU/panitumumab  10/30/2023 Cycle 21 5-FU/Panitumumab  11/13/2023 Cycle 22 5-FU/Panitumumab  11/27/2023 Cycle 23 5-FU/panitumumab  12/11/2023 Cycle 24 5-FU/Panitumumab  12/25/2023 Cycle 25 5-FU/panitumumab  01/08/2024 CT 01/22/2024: Stable liver metastases, no evidence of disease progression Cycle 26 5-FU/panitumumab  01/29/2024 Cycle 27 5-FU/panitumumab  02/12/2024 Cycle 28 5-FU/panitumumab  02/26/2024 Cycle 29 5-FU/Panitumumab  03/11/2024 Cycle 30 5-FU/panitumumab  03/25/2024 Cycle 31 5-FU/panitumumab  04/08/2024 Cycle 32 5-FU/Panitumumab  04/22/2024 CT 04/29/2024: unchanged liver metastases, no evidence of progression. Liver lesions smaller on our measurement possibly from contrast phase Cycle 33 5-FU/Panitumumab  05/06/2024 Diabetes Cardiomyopathy Hypertension Hypercholesterolemia Weight loss secondary to Mounjaro, metastatic  carcinoma? 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 Shippy appears stable, tolerating 5FU/panitumumab . SEs are adequately managed with supportive care at home, he is able to recover and function with good PS. There is no clinical evidence of disease progression.   We reviewed the restaging CT which is stable to slightly improved, liver lesions are less discrete due to contrast phase. No evidence of progression or new sites of disease. We will follow the CEA which was slightly elevated 2 weeks ago. We recommend to continue the current regimen.   Labs reviewed, Mg 1.5, continue oral  mag BID. Will give IV mag in 2 weeks if level decreases. CBC/CMP adequate for treatment.  Proceed with 5FU/panitumumab  today, no dose adjustments. Return for lab/fup and next cycle in 2 weeks or sooner if needed.   Patient seen with Dr. Cloretta.    All questions were answered. The patient knows to call the clinic with any problems, questions or concerns. No barriers to learning were detected.  Shelanda Duvall K Agnes Probert, NP 05/06/2024   This was a shared visit with Armida Silversmith.  Mr. Tavenner was interviewed and examined.  We reviewed the restaging CT findings and images with him.  The CT reveals no evidence of disease progression.  He agrees to continue 5-FU/panitumumab .  Mr. Buelow will return for an office visit and chemotherapy in 2 weeks.  The magnesium  is mildly low today.  He will continue an oral magnesium  supplement.  We will give IV magnesium  if the level returns lower when he is here in 2 weeks.  Arvella Cloretta, MD

## 2024-05-06 ENCOUNTER — Inpatient Hospital Stay

## 2024-05-06 ENCOUNTER — Inpatient Hospital Stay (HOSPITAL_BASED_OUTPATIENT_CLINIC_OR_DEPARTMENT_OTHER): Admitting: Nurse Practitioner

## 2024-05-06 ENCOUNTER — Encounter: Payer: Self-pay | Admitting: Nurse Practitioner

## 2024-05-06 VITALS — BP 113/80 | HR 100 | Temp 97.6°F | Resp 18 | Ht 66.0 in | Wt 230.0 lb

## 2024-05-06 VITALS — BP 107/78 | HR 95 | Temp 97.6°F | Resp 16

## 2024-05-06 DIAGNOSIS — C187 Malignant neoplasm of sigmoid colon: Secondary | ICD-10-CM

## 2024-05-06 DIAGNOSIS — Z5112 Encounter for antineoplastic immunotherapy: Secondary | ICD-10-CM | POA: Diagnosis not present

## 2024-05-06 LAB — CMP (CANCER CENTER ONLY)
ALT: 28 U/L (ref 0–44)
AST: 35 U/L (ref 15–41)
Albumin: 4.2 g/dL (ref 3.5–5.0)
Alkaline Phosphatase: 143 U/L — ABNORMAL HIGH (ref 38–126)
Anion gap: 13 (ref 5–15)
BUN: 14 mg/dL (ref 6–20)
CO2: 25 mmol/L (ref 22–32)
Calcium: 9.1 mg/dL (ref 8.9–10.3)
Chloride: 101 mmol/L (ref 98–111)
Creatinine: 0.76 mg/dL (ref 0.61–1.24)
GFR, Estimated: >60 mL/min (ref >60)
Glucose, Bld: 191 mg/dL — ABNORMAL HIGH (ref 70–99)
Potassium: 3.7 mmol/L (ref 3.5–5.1)
Sodium: 139 mmol/L (ref 135–145)
Total Bilirubin: 0.7 mg/dL (ref 0.0–1.2)
Total Protein: 7.6 g/dL (ref 6.5–8.1)

## 2024-05-06 LAB — CBC WITH DIFFERENTIAL (CANCER CENTER ONLY)
Abs Immature Granulocytes: 0.05 K/uL (ref 0.00–0.07)
Basophils Absolute: 0.1 K/uL (ref 0.0–0.1)
Basophils Relative: 1 %
Eosinophils Absolute: 1.2 K/uL — ABNORMAL HIGH (ref 0.0–0.5)
Eosinophils Relative: 9 %
HCT: 45.6 % (ref 39.0–52.0)
Hemoglobin: 15.5 g/dL (ref 13.0–17.0)
Immature Granulocytes: 0 %
Lymphocytes Relative: 16 %
Lymphs Abs: 2 K/uL (ref 0.7–4.0)
MCH: 31.7 pg (ref 26.0–34.0)
MCHC: 34 g/dL (ref 30.0–36.0)
MCV: 93.3 fL (ref 80.0–100.0)
Monocytes Absolute: 1.1 K/uL — ABNORMAL HIGH (ref 0.1–1.0)
Monocytes Relative: 8 %
Neutro Abs: 8.6 K/uL — ABNORMAL HIGH (ref 1.7–7.7)
Neutrophils Relative %: 66 %
Platelet Count: 144 K/uL — ABNORMAL LOW (ref 150–400)
RBC: 4.89 MIL/uL (ref 4.22–5.81)
RDW: 14.7 % (ref 11.5–15.5)
WBC Count: 13 K/uL — ABNORMAL HIGH (ref 4.0–10.5)
nRBC: 0 % (ref 0.0–0.2)

## 2024-05-06 LAB — CEA (ACCESS): CEA (CHCC): 34.42 ng/mL — ABNORMAL HIGH (ref 0.00–5.00)

## 2024-05-06 LAB — MAGNESIUM: Magnesium: 1.5 mg/dL — ABNORMAL LOW (ref 1.7–2.4)

## 2024-05-06 MED ORDER — FLUOROURACIL CHEMO INJECTION 2.5 GM/50ML
400.0000 mg/m2 | Freq: Once | INTRAVENOUS | Status: AC
  Administered 2024-05-06: 850 mg via INTRAVENOUS
  Filled 2024-05-06: qty 17

## 2024-05-06 MED ORDER — SODIUM CHLORIDE 0.9 % IV SOLN: Freq: Once | INTRAVENOUS | Status: AC

## 2024-05-06 MED ORDER — SODIUM CHLORIDE 0.9 % IV SOLN
6.0000 mg/kg | Freq: Once | INTRAVENOUS | Status: AC
  Administered 2024-05-06: 600 mg via INTRAVENOUS
  Filled 2024-05-06: qty 20

## 2024-05-06 MED ORDER — SODIUM CHLORIDE 0.9 % IV SOLN
400.0000 mg/m2 | Freq: Once | INTRAVENOUS | Status: AC
  Administered 2024-05-06: 836 mg via INTRAVENOUS
  Filled 2024-05-06: qty 41.8

## 2024-05-06 MED ORDER — SODIUM CHLORIDE 0.9 % IV SOLN
2400.0000 mg/m2 | INTRAVENOUS | Status: DC
  Administered 2024-05-06: 5000 mg via INTRAVENOUS
  Filled 2024-05-06: qty 100

## 2024-05-06 NOTE — Patient Instructions (Signed)

## 2024-05-06 NOTE — Patient Instructions (Signed)
 [facility] - A DEPT OF [place]  Discharge Instructions: Thank you for choosing [facility] to provide your oncology and hematology care.   If you have a lab appointment with the Cancer Center, please go directly to the Cancer Center and check in at the registration area.   Wear comfortable clothing and clothing appropriate for easy access to any Portacath or PICC line.   We strive to give you quality time with your provider. You may need to reschedule your appointment if you arrive late (15 or more minutes).  Arriving late affects you and other patients whose appointments are after yours.  Also, if you miss three or more appointments without notifying the office, you may be dismissed from the clinic at the provider's discretion.      For prescription refill requests, have your pharmacy contact our office and allow 72 hours for refills to be completed.    Today you received the following chemotherapy and/or immunotherapy agents: vectibix , leucovorin , fluorouracil        To help prevent nausea and vomiting after your treatment, we encourage you to take your nausea medication as directed.  BELOW ARE SYMPTOMS THAT SHOULD BE REPORTED IMMEDIATELY: *FEVER GREATER THAN 100.4 F (38 C) OR HIGHER *CHILLS OR SWEATING *NAUSEA AND VOMITING THAT IS NOT CONTROLLED WITH YOUR NAUSEA MEDICATION *UNUSUAL SHORTNESS OF BREATH *UNUSUAL BRUISING OR BLEEDING *URINARY PROBLEMS (pain or burning when urinating, or frequent urination) *BOWEL PROBLEMS (unusual diarrhea, constipation, pain near the anus) TENDERNESS IN MOUTH AND THROAT WITH OR WITHOUT PRESENCE OF ULCERS (sore throat, sores in mouth, or a toothache) UNUSUAL RASH, SWELLING OR PAIN  UNUSUAL VAGINAL DISCHARGE OR ITCHING   Items with * indicate a potential emergency and should be followed up as soon as possible or go to the Emergency Department if any problems should occur.  Please show the CHEMOTHERAPY  ALERT CARD or IMMUNOTHERAPY ALERT CARD at check-in to the Emergency Department and triage nurse.  Should you have questions after your visit or need to cancel or reschedule your appointment, please contact [facility]  Dept: [phone]  and follow the prompts.  Office hours are 8:00 a.m. to 4:30 p.m. Monday - Friday. Please note that voicemails left after 4:00 p.m. may not be returned until the following business day.  We are closed weekends and major holidays. You have access to a nurse at all times for urgent questions. Please call the main number to the clinic Dept: [phone] and follow the prompts.   For any non-urgent questions, you may also contact your provider using MyChart. We now offer e-Visits for anyone 85 and older to request care online for non-urgent symptoms. For details visit [email or web address].   Also download the MyChart app! Go to the app store, search MyChart, open the app, select [place], and log in with your MyChart username and password.

## 2024-05-06 NOTE — Progress Notes (Signed)
 Patient seen by Lacie Burton, NP today  Vitals are within treatment parameters:Yes   Labs are within treatment parameters: Yes mag 1.5   Treatment plan has been signed: Yes   Per physician team, Patient is ready for treatment and there are NO modifications to the treatment plan.

## 2024-05-08 ENCOUNTER — Inpatient Hospital Stay

## 2024-05-08 VITALS — BP 109/80 | HR 95 | Temp 98.1°F | Resp 18

## 2024-05-08 DIAGNOSIS — C187 Malignant neoplasm of sigmoid colon: Secondary | ICD-10-CM

## 2024-05-13 ENCOUNTER — Other Ambulatory Visit: Payer: Self-pay | Admitting: Oncology

## 2024-05-20 ENCOUNTER — Inpatient Hospital Stay

## 2024-05-20 ENCOUNTER — Inpatient Hospital Stay (HOSPITAL_BASED_OUTPATIENT_CLINIC_OR_DEPARTMENT_OTHER): Admitting: Nurse Practitioner

## 2024-05-20 ENCOUNTER — Encounter: Payer: Self-pay | Admitting: Nurse Practitioner

## 2024-05-20 ENCOUNTER — Encounter: Payer: Self-pay | Admitting: Oncology

## 2024-05-20 ENCOUNTER — Other Ambulatory Visit: Payer: Self-pay

## 2024-05-20 VITALS — BP 102/70 | HR 94 | Temp 97.3°F | Resp 16 | Wt 231.0 lb

## 2024-05-20 DIAGNOSIS — C187 Malignant neoplasm of sigmoid colon: Secondary | ICD-10-CM | POA: Diagnosis not present

## 2024-05-20 DIAGNOSIS — Z5112 Encounter for antineoplastic immunotherapy: Secondary | ICD-10-CM | POA: Diagnosis not present

## 2024-05-20 LAB — CMP (CANCER CENTER ONLY)
ALT: 25 U/L (ref 0–44)
AST: 42 U/L — ABNORMAL HIGH (ref 15–41)
Albumin: 4.1 g/dL (ref 3.5–5.0)
Alkaline Phosphatase: 153 U/L — ABNORMAL HIGH (ref 38–126)
Anion gap: 11 (ref 5–15)
BUN: 13 mg/dL (ref 6–20)
CO2: 27 mmol/L (ref 22–32)
Calcium: 8.9 mg/dL (ref 8.9–10.3)
Chloride: 101 mmol/L (ref 98–111)
Creatinine: 0.82 mg/dL (ref 0.61–1.24)
GFR, Estimated: >60 mL/min (ref >60)
Glucose, Bld: 194 mg/dL — ABNORMAL HIGH (ref 70–99)
Potassium: 3.8 mmol/L (ref 3.5–5.1)
Sodium: 139 mmol/L (ref 135–145)
Total Bilirubin: 0.7 mg/dL (ref 0.0–1.2)
Total Protein: 7.3 g/dL (ref 6.5–8.1)

## 2024-05-20 LAB — CBC WITH DIFFERENTIAL (CANCER CENTER ONLY)
Abs Immature Granulocytes: 0.05 K/uL (ref 0.00–0.07)
Basophils Absolute: 0.1 K/uL (ref 0.0–0.1)
Basophils Relative: 1 %
Eosinophils Absolute: 1.1 K/uL — ABNORMAL HIGH (ref 0.0–0.5)
Eosinophils Relative: 8 %
HCT: 45.3 % (ref 39.0–52.0)
Hemoglobin: 15.3 g/dL (ref 13.0–17.0)
Immature Granulocytes: 0 %
Lymphocytes Relative: 13 %
Lymphs Abs: 1.9 K/uL (ref 0.7–4.0)
MCH: 31.9 pg (ref 26.0–34.0)
MCHC: 33.8 g/dL (ref 30.0–36.0)
MCV: 94.4 fL (ref 80.0–100.0)
Monocytes Absolute: 1.1 K/uL — ABNORMAL HIGH (ref 0.1–1.0)
Monocytes Relative: 8 %
Neutro Abs: 10 K/uL — ABNORMAL HIGH (ref 1.7–7.7)
Neutrophils Relative %: 70 %
Platelet Count: 131 K/uL — ABNORMAL LOW (ref 150–400)
RBC: 4.8 MIL/uL (ref 4.22–5.81)
RDW: 14.5 % (ref 11.5–15.5)
WBC Count: 14.2 K/uL — ABNORMAL HIGH (ref 4.0–10.5)
nRBC: 0 % (ref 0.0–0.2)

## 2024-05-20 LAB — MAGNESIUM: Magnesium: 1.5 mg/dL — ABNORMAL LOW (ref 1.7–2.4)

## 2024-05-20 LAB — CEA (ACCESS): CEA (CHCC): 50.77 ng/mL — ABNORMAL HIGH (ref 0.00–5.00)

## 2024-05-20 MED ORDER — MAGNESIUM SULFATE 2 GM/50ML IV SOLN
2.0000 g | Freq: Once | INTRAVENOUS | Status: AC
  Administered 2024-05-20: 2 g via INTRAVENOUS
  Filled 2024-05-20: qty 50

## 2024-05-20 MED ORDER — SODIUM CHLORIDE 0.9 % IV SOLN
6.0000 mg/kg | Freq: Once | INTRAVENOUS | Status: AC
  Administered 2024-05-20: 600 mg via INTRAVENOUS
  Filled 2024-05-20: qty 20

## 2024-05-20 MED ORDER — SODIUM CHLORIDE 0.9 % IV SOLN: Freq: Once | INTRAVENOUS | Status: AC

## 2024-05-20 MED ORDER — SODIUM CHLORIDE 0.9 % IV SOLN
400.0000 mg/m2 | Freq: Once | INTRAVENOUS | Status: AC
  Administered 2024-05-20: 836 mg via INTRAVENOUS
  Filled 2024-05-20: qty 41.8

## 2024-05-20 MED ORDER — SODIUM CHLORIDE 0.9 % IV SOLN
2400.0000 mg/m2 | INTRAVENOUS | Status: DC
  Administered 2024-05-20: 5000 mg via INTRAVENOUS
  Filled 2024-05-20: qty 100

## 2024-05-20 MED ORDER — FLUOROURACIL CHEMO INJECTION 2.5 GM/50ML
400.0000 mg/m2 | Freq: Once | INTRAVENOUS | Status: AC
  Administered 2024-05-20: 850 mg via INTRAVENOUS
  Filled 2024-05-20: qty 17

## 2024-05-20 NOTE — Progress Notes (Signed)
 Patient seen by Olam Ned NP today  Vitals are within treatment parameters:Yes   Labs are within treatment parameters: Yes   Treatment plan has been signed: Yes   Per physician team, Patient is ready for treatment and there are NO modifications to the treatment plan.

## 2024-05-20 NOTE — Progress Notes (Signed)
 [name] OFFICE PROGRESS NOTE   Diagnosis: Colon cancer  INTERVAL HISTORY:   Daniel [name] returns as scheduled.  He completed another cycle of 5-FU/Panitumumab  05/06/2024.  He denies nausea/vomiting.  No mouth sores.  No diarrhea.  Skin rash in general has improved.  No abdominal pain.  He has a good appetite.  Objective:  Vital signs in last 24 hours:  Blood pressure 102/70, pulse 94, temperature (!) 97.3 F (36.3 C), temperature source Temporal, resp. rate 16, weight 231 lb (104.8 kg), SpO2 97%.    HEENT: No thrush or ulcers. Resp: Lungs clear bilaterally. Cardio: Regular rate and rhythm. GI: No hepatosplenomegaly.  Nontender. Vascular: No leg edema. Neuro: Alert and oriented. Skin: Skin in general has a dry appearance.  Acne type rash over the trunk.  Mild erythema/dryness over the face.  Overall the skin rash is better. Port-A-Cath without erythema.  Lab Results:  Lab Results  Component Value Date   WBC 14.2 (H) 05/20/2024   HGB 15.3 05/20/2024   HCT 45.3 05/20/2024   MCV 94.4 05/20/2024   PLT 131 (L) 05/20/2024   NEUTROABS 10.0 (H) 05/20/2024    Imaging:  No results found.  Medications: I have reviewed the patient's current medications.  Assessment/Plan: Sigmoid colon mass, multiple liver lesions Ultrasound abdomen 01/08/2023-multifocal hyperechoic liver lesions, largest measuring 8.7 cm in the right hepatic lobe CT abdomen/pelvis 01/08/2023-multiple by lobar hypoattenuating hepatic lesions, masslike thickening of the sigmoid colon with abnormal spiculated pericolonic soft tissue.  IMA lymphadenopathy.  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 Colonoscopy 01/29/2023-malignant tumor in the sigmoid colon  (invasive moderately differentiated adenocarcinoma), four 6-9 mm polyps in the rectum, ascending colon and cecum, one 14 mm polyp in the sigmoid colon (within the rectal polyp biopsies there are 2 similar-appearing fragments showing invasive tumor with associated adenomatous change which is felt to be carryover from the sigmoid mass biopsies, the rectal polyp biopsies also show multiple fragments of tubular adenoma without high-grade dysplasia) Cycle 1 FOLFOX 01/30/2023 Cycle 2 FOLFOX 02/12/2023 Cycle 3 FOLFOX plus Panitumumab  02/27/2023 Cycle 4 FOLFOX plus Panitumumab  03/13/2023 Cycle 5 FOLFOX plus panitumumab  03/27/2023 Cycle 6 FOLFOX plus Panitumumab  04/10/2023 CTs 04/19/2023-decrease size of hepatic metastases, no new lesions, decreased right upper quadrant and retroperitoneal lymph nodes, sigmoid colonic wall thickening no longer seen with a decrease in sigmoid mesenteric lymph nodes Cycle 7 FOLFOX plus panitumumab  04/24/2023, oxaliplatin  dose reduced secondary to thrombocytopenia and neuropathy Cycle 8 FOLFOX plus Panitumumab  05/08/2023 Cycle 9 FOLFOX plus Panitumumab  05/22/2023 Cycle 10 FOLFOX plus Panitumumab  06/05/2023 Cycle 11 FOLFOX plus panitumumab  06/19/2023 Cycle 12 FOLFOX plus Panitumumab  07/03/2023, oxaliplatin  held due to thrombocytopenia CTs 07/11/2023: Decrease in hepatic metastases, no enlarged abdominal or pelvic nodes, no metastatic disease to the chest Cycle 13 of 5-FU/panitumumab  07/23/2023 Cycle 14 5-FU/Panitumumab  08/07/2023 Cycle 15 5-FU/panitumumab  08/21/2023 Cycle 16 5-FU/Panitumumab  09/04/2023 Cycle 17 5-FU/panitumumab  09/18/2023 Cycle 18 5-FU/Panitumumab  10/02/2023 Cycle 19 5-FU/Panitumumab  10/16/2023 CTs 10/24/2023: Decrease in hepatic metastases, no evidence of disease progression Cycle 20 5-FU/panitumumab  10/30/2023 Cycle 21 5-FU/Panitumumab  11/13/2023 Cycle 22 5-FU/Panitumumab  11/27/2023 Cycle 23 5-FU/panitumumab  12/11/2023 Cycle 24 5-FU/Panitumumab  12/25/2023 Cycle 25  5-FU/panitumumab  01/08/2024 CT 01/22/2024: Stable liver metastases, no evidence of disease progression Cycle 26 5-FU/panitumumab  01/29/2024 Cycle 27 5-FU/panitumumab  02/12/2024 Cycle 28 5-FU/panitumumab  02/26/2024 Cycle 29 5-FU/Panitumumab  03/11/2024 Cycle 30 5-FU/panitumumab  03/25/2024 Cycle 31 5-FU/panitumumab  04/08/2024 Cycle 32 5-FU/Panitumumab  04/22/2024  CT 04/29/2024: unchanged liver metastases, no evidence of progression. Liver lesions smaller on our measurement possibly from contrast phase Cycle 33 5-FU/Panitumumab  05/06/2024 Cycle 34 5-FU/panitumumab  05/20/2024 Diabetes Cardiomyopathy Hypertension Hypercholesterolemia Weight loss secondary to Mounjaro, metastatic carcinoma? 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 appears stable.  He continues 5-FU/Panitumumab .  He is tolerating treatment well.  There is no clinical evidence of disease progression.  Plan to proceed with another cycle of 5-FU/Panitumumab  today as scheduled.  CBC and chemistry panel reviewed.  Labs adequate for treatment.  We will follow-up on the magnesium .  He will return for follow-up and treatment in 2 weeks.  We are available to see him sooner if needed.    Olam Ned ANP/GNP-BC   05/20/2024  9:02 AM

## 2024-05-20 NOTE — Progress Notes (Signed)
 Magnesium  resulted as 1.5. notified NP. NP placing order for IV mag 2 g.

## 2024-05-20 NOTE — Patient Instructions (Signed)
 [facility] - A DEPT OF [place]  Discharge Instructions: Thank you for choosing [facility] to provide your oncology and hematology care.   If you have a lab appointment with the Cancer Center, please go directly to the Cancer Center and check in at the registration area.   Wear comfortable clothing and clothing appropriate for easy access to any Portacath or PICC line.   We strive to give you quality time with your provider. You may need to reschedule your appointment if you arrive late (15 or more minutes).  Arriving late affects you and other patients whose appointments are after yours.  Also, if you miss three or more appointments without notifying the office, you may be dismissed from the clinic at the provider's discretion.      For prescription refill requests, have your pharmacy contact our office and allow 72 hours for refills to be completed.    Today you received the following chemotherapy and/or immunotherapy agents: vectibix , leucovorin , fluorouracil        To help prevent nausea and vomiting after your treatment, we encourage you to take your nausea medication as directed.  BELOW ARE SYMPTOMS THAT SHOULD BE REPORTED IMMEDIATELY: *FEVER GREATER THAN 100.4 F (38 C) OR HIGHER *CHILLS OR SWEATING *NAUSEA AND VOMITING THAT IS NOT CONTROLLED WITH YOUR NAUSEA MEDICATION *UNUSUAL SHORTNESS OF BREATH *UNUSUAL BRUISING OR BLEEDING *URINARY PROBLEMS (pain or burning when urinating, or frequent urination) *BOWEL PROBLEMS (unusual diarrhea, constipation, pain near the anus) TENDERNESS IN MOUTH AND THROAT WITH OR WITHOUT PRESENCE OF ULCERS (sore throat, sores in mouth, or a toothache) UNUSUAL RASH, SWELLING OR PAIN  UNUSUAL VAGINAL DISCHARGE OR ITCHING   Items with * indicate a potential emergency and should be followed up as soon as possible or go to the Emergency Department if any problems should occur.  Please show the CHEMOTHERAPY  ALERT CARD or IMMUNOTHERAPY ALERT CARD at check-in to the Emergency Department and triage nurse.  Should you have questions after your visit or need to cancel or reschedule your appointment, please contact [facility]  Dept: [phone]  and follow the prompts.  Office hours are 8:00 a.m. to 4:30 p.m. Monday - Friday. Please note that voicemails left after 4:00 p.m. may not be returned until the following business day.  We are closed weekends and major holidays. You have access to a nurse at all times for urgent questions. Please call the main number to the clinic Dept: [phone] and follow the prompts.   For any non-urgent questions, you may also contact your provider using MyChart. We now offer e-Visits for anyone 85 and older to request care online for non-urgent symptoms. For details visit [email or web address].   Also download the MyChart app! Go to the app store, search MyChart, open the app, select [place], and log in with your MyChart username and password.

## 2024-05-21 ENCOUNTER — Other Ambulatory Visit: Payer: Self-pay

## 2024-05-22 ENCOUNTER — Other Ambulatory Visit: Payer: Self-pay

## 2024-05-22 ENCOUNTER — Inpatient Hospital Stay

## 2024-05-22 VITALS — BP 106/81 | HR 100 | Temp 98.6°F | Resp 18

## 2024-05-22 DIAGNOSIS — C187 Malignant neoplasm of sigmoid colon: Secondary | ICD-10-CM

## 2024-05-22 NOTE — Patient Instructions (Signed)

## 2024-05-27 ENCOUNTER — Other Ambulatory Visit: Payer: Self-pay | Admitting: Oncology

## 2024-06-03 ENCOUNTER — Other Ambulatory Visit: Payer: Self-pay

## 2024-06-03 ENCOUNTER — Inpatient Hospital Stay: Attending: Oncology

## 2024-06-03 ENCOUNTER — Inpatient Hospital Stay (HOSPITAL_BASED_OUTPATIENT_CLINIC_OR_DEPARTMENT_OTHER): Admitting: Nurse Practitioner

## 2024-06-03 ENCOUNTER — Inpatient Hospital Stay

## 2024-06-03 ENCOUNTER — Encounter: Payer: Self-pay | Admitting: Nurse Practitioner

## 2024-06-03 VITALS — BP 106/72 | HR 104 | Temp 97.7°F | Resp 18 | Ht 66.0 in | Wt 230.4 lb

## 2024-06-03 VITALS — BP 109/83 | HR 91 | Temp 98.3°F | Resp 18

## 2024-06-03 DIAGNOSIS — C187 Malignant neoplasm of sigmoid colon: Secondary | ICD-10-CM

## 2024-06-03 DIAGNOSIS — Z5111 Encounter for antineoplastic chemotherapy: Secondary | ICD-10-CM | POA: Diagnosis present

## 2024-06-03 DIAGNOSIS — C787 Secondary malignant neoplasm of liver and intrahepatic bile duct: Secondary | ICD-10-CM | POA: Insufficient documentation

## 2024-06-03 DIAGNOSIS — Z5112 Encounter for antineoplastic immunotherapy: Secondary | ICD-10-CM | POA: Diagnosis present

## 2024-06-03 DIAGNOSIS — Z79631 Long term (current) use of antimetabolite agent: Secondary | ICD-10-CM | POA: Diagnosis not present

## 2024-06-03 LAB — CBC WITH DIFFERENTIAL (CANCER CENTER ONLY)
Abs Immature Granulocytes: 0.04 K/uL (ref 0.00–0.07)
Basophils Absolute: 0.1 K/uL (ref 0.0–0.1)
Basophils Relative: 1 %
Eosinophils Absolute: 1.4 K/uL — ABNORMAL HIGH (ref 0.0–0.5)
Eosinophils Relative: 10 %
HCT: 45 % (ref 39.0–52.0)
Hemoglobin: 15.2 g/dL (ref 13.0–17.0)
Immature Granulocytes: 0 %
Lymphocytes Relative: 13 %
Lymphs Abs: 1.9 K/uL (ref 0.7–4.0)
MCH: 31.7 pg (ref 26.0–34.0)
MCHC: 33.8 g/dL (ref 30.0–36.0)
MCV: 93.8 fL (ref 80.0–100.0)
Monocytes Absolute: 1.1 K/uL — ABNORMAL HIGH (ref 0.1–1.0)
Monocytes Relative: 7 %
Neutro Abs: 10.3 K/uL — ABNORMAL HIGH (ref 1.7–7.7)
Neutrophils Relative %: 69 %
Platelet Count: 140 K/uL — ABNORMAL LOW (ref 150–400)
RBC: 4.8 MIL/uL (ref 4.22–5.81)
RDW: 14.4 % (ref 11.5–15.5)
WBC Count: 14.8 K/uL — ABNORMAL HIGH (ref 4.0–10.5)
nRBC: 0 % (ref 0.0–0.2)

## 2024-06-03 LAB — CEA (ACCESS): CEA (CHCC): 73.77 ng/mL — ABNORMAL HIGH (ref 0.00–5.00)

## 2024-06-03 LAB — CMP (CANCER CENTER ONLY)
ALT: 33 U/L (ref 0–44)
AST: 41 U/L (ref 15–41)
Albumin: 4 g/dL (ref 3.5–5.0)
Alkaline Phosphatase: 168 U/L — ABNORMAL HIGH (ref 38–126)
Anion gap: 10 (ref 5–15)
BUN: 12 mg/dL (ref 6–20)
CO2: 27 mmol/L (ref 22–32)
Calcium: 9 mg/dL (ref 8.9–10.3)
Chloride: 103 mmol/L (ref 98–111)
Creatinine: 0.75 mg/dL (ref 0.61–1.24)
GFR, Estimated: >60 mL/min (ref >60)
Glucose, Bld: 109 mg/dL — ABNORMAL HIGH (ref 70–99)
Potassium: 3.8 mmol/L (ref 3.5–5.1)
Sodium: 140 mmol/L (ref 135–145)
Total Bilirubin: 0.7 mg/dL (ref 0.0–1.2)
Total Protein: 7.4 g/dL (ref 6.5–8.1)

## 2024-06-03 LAB — MAGNESIUM: Magnesium: 1.5 mg/dL — ABNORMAL LOW (ref 1.7–2.4)

## 2024-06-03 MED ORDER — FLUOROURACIL CHEMO INJECTION 2.5 GM/50ML
400.0000 mg/m2 | Freq: Once | INTRAVENOUS | Status: AC
  Administered 2024-06-03: 850 mg via INTRAVENOUS
  Filled 2024-06-03: qty 17

## 2024-06-03 MED ORDER — SODIUM CHLORIDE 0.9 % IV SOLN: Freq: Once | INTRAVENOUS | Status: AC

## 2024-06-03 MED ORDER — SODIUM CHLORIDE 0.9 % IV SOLN
2400.0000 mg/m2 | INTRAVENOUS | Status: DC
  Administered 2024-06-03: 5000 mg via INTRAVENOUS
  Filled 2024-06-03: qty 100

## 2024-06-03 MED ORDER — SODIUM CHLORIDE 0.9 % IV SOLN
400.0000 mg/m2 | Freq: Once | INTRAVENOUS | Status: AC
  Administered 2024-06-03: 836 mg via INTRAVENOUS
  Filled 2024-06-03: qty 41.8

## 2024-06-03 MED ORDER — SODIUM CHLORIDE 0.9 % IV SOLN
6.0000 mg/kg | Freq: Once | INTRAVENOUS | Status: AC
  Administered 2024-06-03: 600 mg via INTRAVENOUS
  Filled 2024-06-03: qty 20

## 2024-06-03 MED ORDER — MAGNESIUM SULFATE 2 GM/50ML IV SOLN
2.0000 g | Freq: Once | INTRAVENOUS | Status: AC
  Administered 2024-06-03: 2 g via INTRAVENOUS
  Filled 2024-06-03: qty 50

## 2024-06-03 NOTE — Progress Notes (Signed)
 Patients port flushed without difficulty.  Good blood return noted with no bruising or swelling noted at site. Patient remains accessed for treatment.

## 2024-06-03 NOTE — Progress Notes (Signed)
 Patient seen by Olam Ned NP today  Vitals are within treatment parameters:Yes   Labs are within treatment parameters: No (Please specify and give further instructions.) magnesium  1.5  Treatment plan has been signed: Yes   Per physician team, Patient is ready for treatment. Please note the following modifications:Patient will be receive 2g of magnesium 

## 2024-06-03 NOTE — Patient Instructions (Signed)
 [facility] - A DEPT OF [place]  Discharge Instructions: Thank you for choosing [facility] to provide your oncology and hematology care.   If you have a lab appointment with the Cancer Center, please go directly to the Cancer Center and check in at the registration area.   Wear comfortable clothing and clothing appropriate for easy access to any Portacath or PICC line.   We strive to give you quality time with your provider. You may need to reschedule your appointment if you arrive late (15 or more minutes).  Arriving late affects you and other patients whose appointments are after yours.  Also, if you miss three or more appointments without notifying the office, you may be dismissed from the clinic at the provider's discretion.      For prescription refill requests, have your pharmacy contact our office and allow 72 hours for refills to be completed.    Today you received the following chemotherapy and/or immunotherapy agents: vectibix , leucovorin , fluorouracil        To help prevent nausea and vomiting after your treatment, we encourage you to take your nausea medication as directed.  BELOW ARE SYMPTOMS THAT SHOULD BE REPORTED IMMEDIATELY: *FEVER GREATER THAN 100.4 F (38 C) OR HIGHER *CHILLS OR SWEATING *NAUSEA AND VOMITING THAT IS NOT CONTROLLED WITH YOUR NAUSEA MEDICATION *UNUSUAL SHORTNESS OF BREATH *UNUSUAL BRUISING OR BLEEDING *URINARY PROBLEMS (pain or burning when urinating, or frequent urination) *BOWEL PROBLEMS (unusual diarrhea, constipation, pain near the anus) TENDERNESS IN MOUTH AND THROAT WITH OR WITHOUT PRESENCE OF ULCERS (sore throat, sores in mouth, or a toothache) UNUSUAL RASH, SWELLING OR PAIN  UNUSUAL VAGINAL DISCHARGE OR ITCHING   Items with * indicate a potential emergency and should be followed up as soon as possible or go to the Emergency Department if any problems should occur.  Please show the CHEMOTHERAPY  ALERT CARD or IMMUNOTHERAPY ALERT CARD at check-in to the Emergency Department and triage nurse.  Should you have questions after your visit or need to cancel or reschedule your appointment, please contact [facility]  Dept: [phone]  and follow the prompts.  Office hours are 8:00 a.m. to 4:30 p.m. Monday - Friday. Please note that voicemails left after 4:00 p.m. may not be returned until the following business day.  We are closed weekends and major holidays. You have access to a nurse at all times for urgent questions. Please call the main number to the clinic Dept: [phone] and follow the prompts.   For any non-urgent questions, you may also contact your provider using MyChart. We now offer e-Visits for anyone 85 and older to request care online for non-urgent symptoms. For details visit [email or web address].   Also download the MyChart app! Go to the app store, search MyChart, open the app, select [place], and log in with your MyChart username and password.

## 2024-06-03 NOTE — Progress Notes (Signed)
 [facility] OFFICE PROGRESS NOTE   Diagnosis: Colon cancer  INTERVAL HISTORY:   Daniel [name] returns as scheduled.  He completed another cycle of 5-FU/Panitumumab  05/20/2024.  He denies nausea/vomiting.  No mouth sores.  No diarrhea.  Skin rash overall is stable.  No abdominal pain.  He has a good appetite.  Objective:  Vital signs in last 24 hours:  Blood pressure 106/72, pulse (!) 104, temperature 97.7 F (36.5 C), temperature source Oral, resp. rate 18, height 5' 6 (1.676 m), weight 230 lb 6.4 oz (104.5 kg), SpO2 100%.    HEENT: No thrush or ulcers. Resp: Lungs clear bilaterally. Cardio: Regular rate and rhythm. GI: No hepatosplenomegaly. Vascular: No leg edema. Skin: Skin is dry.  Acne type rash over the trunk and face.  Overall skin rash appears stable. Port-A-Cath without erythema.  Lab Results:  Lab Results  Component Value Date   WBC 14.8 (H) 06/03/2024   HGB 15.2 06/03/2024   HCT 45.0 06/03/2024   MCV 93.8 06/03/2024   PLT 140 (L) 06/03/2024   NEUTROABS 10.3 (H) 06/03/2024    Imaging:  No results found.  Medications: I have reviewed the patient's current medications.  Assessment/Plan: Sigmoid colon mass, multiple liver lesions Ultrasound abdomen 01/08/2023-multifocal hyperechoic liver lesions, largest measuring 8.7 cm in the right hepatic lobe CT abdomen/pelvis 01/08/2023-multiple by lobar hypoattenuating hepatic lesions, masslike thickening of the sigmoid colon with abnormal spiculated pericolonic soft tissue.  IMA lymphadenopathy.  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 Colonoscopy 01/29/2023-malignant tumor in the sigmoid colon (invasive moderately differentiated adenocarcinoma), four 6-9 mm polyps  in the rectum, ascending colon and cecum, one 14 mm polyp in the sigmoid colon (within the rectal polyp biopsies there are 2 similar-appearing fragments showing invasive tumor with associated adenomatous change which is felt to be carryover from the sigmoid mass biopsies, the rectal polyp biopsies also show multiple fragments of tubular adenoma without high-grade dysplasia) Cycle 1 FOLFOX 01/30/2023 Cycle 2 FOLFOX 02/12/2023 Cycle 3 FOLFOX plus Panitumumab  02/27/2023 Cycle 4 FOLFOX plus Panitumumab  03/13/2023 Cycle 5 FOLFOX plus panitumumab  03/27/2023 Cycle 6 FOLFOX plus Panitumumab  04/10/2023 CTs 04/19/2023-decrease size of hepatic metastases, no new lesions, decreased right upper quadrant and retroperitoneal lymph nodes, sigmoid colonic wall thickening no longer seen with a decrease in sigmoid mesenteric lymph nodes Cycle 7 FOLFOX plus panitumumab  04/24/2023, oxaliplatin  dose reduced secondary to thrombocytopenia and neuropathy Cycle 8 FOLFOX plus Panitumumab  05/08/2023 Cycle 9 FOLFOX plus Panitumumab  05/22/2023 Cycle 10 FOLFOX plus Panitumumab  06/05/2023 Cycle 11 FOLFOX plus panitumumab  06/19/2023 Cycle 12 FOLFOX plus Panitumumab  07/03/2023, oxaliplatin  held due to thrombocytopenia CTs 07/11/2023: Decrease in hepatic metastases, no enlarged abdominal or pelvic nodes, no metastatic disease to the chest Cycle 13 of 5-FU/panitumumab  07/23/2023 Cycle 14 5-FU/Panitumumab  08/07/2023 Cycle 15 5-FU/panitumumab  08/21/2023 Cycle 16 5-FU/Panitumumab  09/04/2023 Cycle 17 5-FU/panitumumab  09/18/2023 Cycle 18 5-FU/Panitumumab  10/02/2023 Cycle 19 5-FU/Panitumumab  10/16/2023 CTs 10/24/2023: Decrease in hepatic metastases, no evidence of disease progression Cycle 20 5-FU/panitumumab  10/30/2023 Cycle 21 5-FU/Panitumumab  11/13/2023 Cycle 22 5-FU/Panitumumab  11/27/2023 Cycle 23 5-FU/panitumumab  12/11/2023 Cycle 24 5-FU/Panitumumab  12/25/2023 Cycle 25 5-FU/panitumumab  01/08/2024 CT 01/22/2024: Stable liver metastases, no evidence of  disease progression Cycle 26 5-FU/panitumumab  01/29/2024 Cycle 27 5-FU/panitumumab  02/12/2024 Cycle 28 5-FU/panitumumab  02/26/2024 Cycle 29 5-FU/Panitumumab  03/11/2024 Cycle 30 5-FU/panitumumab  03/25/2024 Cycle 31 5-FU/panitumumab  04/08/2024 Cycle 32 5-FU/Panitumumab  04/22/2024 CT 04/29/2024: unchanged liver metastases, no evidence of progression.  Liver lesions smaller on our measurement possibly from contrast phase Cycle 33 5-FU/Panitumumab  05/06/2024 Cycle 34 5-FU/panitumumab  05/20/2024 Cycle 35 5-FU/Panitumumab  06/03/2024 Diabetes Cardiomyopathy Hypertension Hypercholesterolemia Weight loss secondary to Mounjaro, metastatic carcinoma? 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 appears stable.  He continues 5-FU/Panitumumab .  He is tolerating well.  There is no clinical evidence of disease progression.  CEA was higher 2 weeks ago.  We will follow-up on the value from today.  Plan to proceed with 5-FU/Panitumumab  today as scheduled.  He agrees with this plan.  CBC and chemistry panel reviewed.  Labs are adequate for treatment.  Persistent low magnesium .  He will increase oral magnesium  to 3 times daily.  He will receive 2 g of IV magnesium  today.  He will return for follow-up and treatment 06/24/2024.  We are available to see him sooner if needed.    Olam Ned ANP/GNP-BC   06/03/2024  9:51 AM

## 2024-06-05 ENCOUNTER — Inpatient Hospital Stay

## 2024-06-05 VITALS — BP 111/78 | HR 98 | Temp 98.2°F | Resp 18

## 2024-06-05 DIAGNOSIS — C187 Malignant neoplasm of sigmoid colon: Secondary | ICD-10-CM

## 2024-06-05 MED ORDER — SODIUM CHLORIDE 0.9% FLUSH
10.0000 mL | INTRAVENOUS | Status: DC | PRN
  Administered 2024-06-05: 10 mL

## 2024-06-05 MED ORDER — HEPARIN SOD (PORK) LOCK FLUSH 100 UNIT/ML IV SOLN: 500.0000 [IU] | Freq: Once | INTRAVENOUS | Status: DC | PRN

## 2024-06-05 NOTE — Patient Instructions (Signed)

## 2024-06-06 ENCOUNTER — Other Ambulatory Visit: Payer: Self-pay | Admitting: Oncology

## 2024-06-06 DIAGNOSIS — C187 Malignant neoplasm of sigmoid colon: Secondary | ICD-10-CM

## 2024-06-08 ENCOUNTER — Encounter: Payer: Self-pay | Admitting: Oncology

## 2024-06-20 ENCOUNTER — Other Ambulatory Visit: Payer: Self-pay | Admitting: Oncology

## 2024-06-20 DIAGNOSIS — C187 Malignant neoplasm of sigmoid colon: Secondary | ICD-10-CM

## 2024-06-22 ENCOUNTER — Encounter: Payer: Self-pay | Admitting: Oncology

## 2024-06-24 ENCOUNTER — Inpatient Hospital Stay: Attending: Oncology

## 2024-06-24 ENCOUNTER — Inpatient Hospital Stay

## 2024-06-24 ENCOUNTER — Inpatient Hospital Stay: Admitting: Oncology

## 2024-06-24 VITALS — BP 98/77 | HR 100 | Temp 98.1°F | Resp 18 | Ht 66.0 in | Wt 229.1 lb

## 2024-06-24 VITALS — BP 105/82 | HR 100 | Temp 98.6°F | Resp 16

## 2024-06-24 DIAGNOSIS — C187 Malignant neoplasm of sigmoid colon: Secondary | ICD-10-CM

## 2024-06-24 DIAGNOSIS — C787 Secondary malignant neoplasm of liver and intrahepatic bile duct: Secondary | ICD-10-CM | POA: Insufficient documentation

## 2024-06-24 DIAGNOSIS — Z5111 Encounter for antineoplastic chemotherapy: Secondary | ICD-10-CM | POA: Insufficient documentation

## 2024-06-24 DIAGNOSIS — E612 Magnesium deficiency: Secondary | ICD-10-CM | POA: Insufficient documentation

## 2024-06-24 DIAGNOSIS — Z5112 Encounter for antineoplastic immunotherapy: Secondary | ICD-10-CM | POA: Diagnosis present

## 2024-06-24 DIAGNOSIS — Z79631 Long term (current) use of antimetabolite agent: Secondary | ICD-10-CM | POA: Diagnosis not present

## 2024-06-24 LAB — CBC WITH DIFFERENTIAL (CANCER CENTER ONLY)
Abs Immature Granulocytes: 0.04 K/uL (ref 0.00–0.07)
Basophils Absolute: 0.1 K/uL (ref 0.0–0.1)
Basophils Relative: 1 %
Eosinophils Absolute: 1.7 K/uL — ABNORMAL HIGH (ref 0.0–0.5)
Eosinophils Relative: 13 %
HCT: 43.9 % (ref 39.0–52.0)
Hemoglobin: 14.8 g/dL (ref 13.0–17.0)
Immature Granulocytes: 0 %
Lymphocytes Relative: 14 %
Lymphs Abs: 1.8 K/uL (ref 0.7–4.0)
MCH: 31.6 pg (ref 26.0–34.0)
MCHC: 33.7 g/dL (ref 30.0–36.0)
MCV: 93.8 fL (ref 80.0–100.0)
Monocytes Absolute: 1 K/uL (ref 0.1–1.0)
Monocytes Relative: 8 %
Neutro Abs: 8.5 K/uL — ABNORMAL HIGH (ref 1.7–7.7)
Neutrophils Relative %: 64 %
Platelet Count: 186 K/uL (ref 150–400)
RBC: 4.68 MIL/uL (ref 4.22–5.81)
RDW: 14.2 % (ref 11.5–15.5)
WBC Count: 13.2 K/uL — ABNORMAL HIGH (ref 4.0–10.5)
nRBC: 0 % (ref 0.0–0.2)

## 2024-06-24 LAB — CMP (CANCER CENTER ONLY)
ALT: 27 U/L (ref 0–44)
AST: 44 U/L — ABNORMAL HIGH (ref 15–41)
Albumin: 3.9 g/dL (ref 3.5–5.0)
Alkaline Phosphatase: 211 U/L — ABNORMAL HIGH (ref 38–126)
Anion gap: 10 (ref 5–15)
BUN: 15 mg/dL (ref 6–20)
CO2: 26 mmol/L (ref 22–32)
Calcium: 9.5 mg/dL (ref 8.9–10.3)
Chloride: 104 mmol/L (ref 98–111)
Creatinine: 0.71 mg/dL (ref 0.61–1.24)
GFR, Estimated: >60 mL/min (ref >60)
Glucose, Bld: 145 mg/dL — ABNORMAL HIGH (ref 70–99)
Potassium: 4.1 mmol/L (ref 3.5–5.1)
Sodium: 140 mmol/L (ref 135–145)
Total Bilirubin: 0.5 mg/dL (ref 0.0–1.2)
Total Protein: 7.6 g/dL (ref 6.5–8.1)

## 2024-06-24 LAB — MAGNESIUM: Magnesium: 1.5 mg/dL — ABNORMAL LOW (ref 1.7–2.4)

## 2024-06-24 LAB — CEA (ACCESS): CEA (CHCC): 100.72 ng/mL — ABNORMAL HIGH (ref 0.00–5.00)

## 2024-06-24 MED ORDER — SODIUM CHLORIDE 0.9 % IV SOLN: Freq: Once | INTRAVENOUS | Status: AC

## 2024-06-24 MED ORDER — FLUOROURACIL CHEMO INJECTION 2.5 GM/50ML
400.0000 mg/m2 | Freq: Once | INTRAVENOUS | Status: AC
  Administered 2024-06-24: 850 mg via INTRAVENOUS
  Filled 2024-06-24: qty 17

## 2024-06-24 MED ORDER — SODIUM CHLORIDE 0.9 % IV SOLN
2400.0000 mg/m2 | INTRAVENOUS | Status: DC
  Administered 2024-06-24: 5000 mg via INTRAVENOUS
  Filled 2024-06-24: qty 100

## 2024-06-24 MED ORDER — SODIUM CHLORIDE 0.9 % IV SOLN
400.0000 mg/m2 | Freq: Once | INTRAVENOUS | Status: AC
  Administered 2024-06-24: 836 mg via INTRAVENOUS
  Filled 2024-06-24: qty 41.8

## 2024-06-24 MED ORDER — SODIUM CHLORIDE 0.9 % IV SOLN
6.0000 mg/kg | Freq: Once | INTRAVENOUS | Status: AC
  Administered 2024-06-24: 600 mg via INTRAVENOUS
  Filled 2024-06-24: qty 20

## 2024-06-24 NOTE — Patient Instructions (Signed)
 [facility] - A DEPT OF [place]  Discharge Instructions: Thank you for choosing [facility] to provide your oncology and hematology care.   If you have a lab appointment with the Cancer Center, please go directly to the Cancer Center and check in at the registration area.   Wear comfortable clothing and clothing appropriate for easy access to any Portacath or PICC line.   We strive to give you quality time with your provider. You may need to reschedule your appointment if you arrive late (15 or more minutes).  Arriving late affects you and other patients whose appointments are after yours.  Also, if you miss three or more appointments without notifying the office, you may be dismissed from the clinic at the provider's discretion.      For prescription refill requests, have your pharmacy contact our office and allow 72 hours for refills to be completed.    Today you received the following chemotherapy and/or immunotherapy agents: vectibix , leucovorin , fluorouracil        To help prevent nausea and vomiting after your treatment, we encourage you to take your nausea medication as directed.  BELOW ARE SYMPTOMS THAT SHOULD BE REPORTED IMMEDIATELY: *FEVER GREATER THAN 100.4 F (38 C) OR HIGHER *CHILLS OR SWEATING *NAUSEA AND VOMITING THAT IS NOT CONTROLLED WITH YOUR NAUSEA MEDICATION *UNUSUAL SHORTNESS OF BREATH *UNUSUAL BRUISING OR BLEEDING *URINARY PROBLEMS (pain or burning when urinating, or frequent urination) *BOWEL PROBLEMS (unusual diarrhea, constipation, pain near the anus) TENDERNESS IN MOUTH AND THROAT WITH OR WITHOUT PRESENCE OF ULCERS (sore throat, sores in mouth, or a toothache) UNUSUAL RASH, SWELLING OR PAIN  UNUSUAL VAGINAL DISCHARGE OR ITCHING   Items with * indicate a potential emergency and should be followed up as soon as possible or go to the Emergency Department if any problems should occur.  Please show the CHEMOTHERAPY  ALERT CARD or IMMUNOTHERAPY ALERT CARD at check-in to the Emergency Department and triage nurse.  Should you have questions after your visit or need to cancel or reschedule your appointment, please contact [facility]  Dept: [phone]  and follow the prompts.  Office hours are 8:00 a.m. to 4:30 p.m. Monday - Friday. Please note that voicemails left after 4:00 p.m. may not be returned until the following business day.  We are closed weekends and major holidays. You have access to a nurse at all times for urgent questions. Please call the main number to the clinic Dept: [phone] and follow the prompts.   For any non-urgent questions, you may also contact your provider using MyChart. We now offer e-Visits for anyone 85 and older to request care online for non-urgent symptoms. For details visit [email or web address].   Also download the MyChart app! Go to the app store, search MyChart, open the app, select [place], and log in with your MyChart username and password.

## 2024-06-24 NOTE — Progress Notes (Signed)
 Patient seen by Dr. Arley Hof today  Vitals are within treatment parameters:Yes   Labs are within treatment parameters: No (Please specify and give further instructions.)  OK to proceed w/Mg+ 1.5  Treatment plan has been signed: Yes   Per physician team, Patient is ready for treatment and there are NO modifications to the treatment plan.

## 2024-06-24 NOTE — Progress Notes (Signed)
 [name] OFFICE PROGRESS NOTE   Diagnosis: Colon cancer  INTERVAL HISTORY:   Daniel [name] returns as scheduled.  He completed another cycle of 5-FU/panitumumab  on 06/03/2024.  No diarrhea.  Stable skin rash.  He has areas of ulceration surrounding the fingernails.  No new complaint.  Objective:  Vital signs in last 24 hours:  Blood pressure 98/77, pulse 100, temperature 98.1 F (36.7 C), temperature source Temporal, resp. rate 18, height 5' 6 (1.676 m), weight 229 lb 1.6 oz (103.9 kg), SpO2 100%.    HEENT: No thrush or ulcers Resp: Lungs clear bilaterally Cardio: Tachycardia, regular rhythm GI: No hepatosplenomegaly Vascular: No leg edema  Skin: Acne type rash over the anterior chest, dryness over the back and hands.  Few areas of paronychia surrounding the thumbnail bilaterally  Portacath/PICC-without erythema  Lab Results:  Lab Results  Component Value Date   WBC 13.2 (H) 06/24/2024   HGB 14.8 06/24/2024   HCT 43.9 06/24/2024   MCV 93.8 06/24/2024   PLT 186 06/24/2024   NEUTROABS 8.5 (H) 06/24/2024    CMP  Lab Results  Component Value Date   NA 140 06/03/2024   K 3.8 06/03/2024   CL 103 06/03/2024   CO2 27 06/03/2024   GLUCOSE 109 (H) 06/03/2024   BUN 12 06/03/2024   CREATININE 0.75 06/03/2024   CALCIUM  9.0 06/03/2024   PROT 7.4 06/03/2024   ALBUMIN 4.0 06/03/2024   AST 41 06/03/2024   ALT 33 06/03/2024   ALKPHOS 168 (H) 06/03/2024   BILITOT 0.7 06/03/2024   GFRNONAA >60 06/03/2024   GFRAA >60 07/23/2016    Lab Results  Component Value Date   CEA 73.77 (H) 06/03/2024     Medications: I have reviewed the patient's current medications.   Assessment/Plan: Sigmoid colon mass, multiple liver lesions Ultrasound abdomen 01/08/2023-multifocal hyperechoic liver lesions, largest measuring 8.7 cm in the right hepatic lobe CT abdomen/pelvis 01/08/2023-multiple by lobar hypoattenuating hepatic lesions, masslike thickening of the sigmoid colon  with abnormal spiculated pericolonic soft tissue.  IMA lymphadenopathy.  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 Colonoscopy 01/29/2023-malignant tumor in the sigmoid colon (invasive moderately differentiated adenocarcinoma), four 6-9 mm polyps in the rectum, ascending colon and cecum, one 14 mm polyp in the sigmoid colon (within the rectal polyp biopsies there are 2 similar-appearing fragments showing invasive tumor with associated adenomatous change which is felt to be carryover from the sigmoid mass biopsies, the rectal polyp biopsies also show multiple fragments of tubular adenoma without high-grade dysplasia) Cycle 1 FOLFOX 01/30/2023 Cycle 2 FOLFOX 02/12/2023 Cycle 3 FOLFOX plus Panitumumab  02/27/2023 Cycle 4 FOLFOX plus Panitumumab  03/13/2023 Cycle 5 FOLFOX plus panitumumab  03/27/2023 Cycle 6 FOLFOX plus Panitumumab  04/10/2023 CTs 04/19/2023-decrease size of hepatic metastases, no new lesions, decreased right upper quadrant and retroperitoneal lymph nodes, sigmoid colonic wall thickening no longer seen with a decrease in sigmoid mesenteric lymph nodes Cycle 7 FOLFOX plus panitumumab  04/24/2023, oxaliplatin  dose reduced secondary to thrombocytopenia and neuropathy Cycle 8 FOLFOX plus Panitumumab  05/08/2023 Cycle 9 FOLFOX plus Panitumumab  05/22/2023 Cycle 10 FOLFOX plus Panitumumab  06/05/2023 Cycle 11 FOLFOX plus panitumumab  06/19/2023 Cycle 12 FOLFOX plus Panitumumab  07/03/2023, oxaliplatin  held due to thrombocytopenia CTs 07/11/2023: Decrease in hepatic metastases, no enlarged abdominal or pelvic nodes, no metastatic disease to the chest Cycle 13 of 5-FU/panitumumab  07/23/2023 Cycle 14 5-FU/Panitumumab  08/07/2023 Cycle 15 5-FU/panitumumab   08/21/2023 Cycle  16 5-FU/Panitumumab  09/04/2023 Cycle 17 5-FU/panitumumab  09/18/2023 Cycle 18 5-FU/Panitumumab  10/02/2023 Cycle 19 5-FU/Panitumumab  10/16/2023 CTs 10/24/2023: Decrease in hepatic metastases, no evidence of disease progression Cycle 20 5-FU/panitumumab  10/30/2023 Cycle 21 5-FU/Panitumumab  11/13/2023 Cycle 22 5-FU/Panitumumab  11/27/2023 Cycle 23 5-FU/panitumumab  12/11/2023 Cycle 24 5-FU/Panitumumab  12/25/2023 Cycle 25 5-FU/panitumumab  01/08/2024 CT 01/22/2024: Stable liver metastases, no evidence of disease progression Cycle 26 5-FU/panitumumab  01/29/2024 Cycle 27 5-FU/panitumumab  02/12/2024 Cycle 28 5-FU/panitumumab  02/26/2024 Cycle 29 5-FU/Panitumumab  03/11/2024 Cycle 30 5-FU/panitumumab  03/25/2024 Cycle 31 5-FU/panitumumab  04/08/2024 Cycle 32 5-FU/Panitumumab  04/22/2024 CT 04/29/2024: unchanged liver metastases, no evidence of progression. Liver lesions smaller on our measurement possibly from contrast phase Cycle 33 5-FU/Panitumumab  05/06/2024 Cycle 34 5-FU/panitumumab  05/20/2024 Cycle 35 5-FU/Panitumumab  06/03/2024 Cycle 36 5-FU/panitumumab  06/24/2024 Diabetes Cardiomyopathy Hypertension Hypercholesterolemia Weight loss secondary to Mounjaro, metastatic carcinoma? 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  He will complete another cycle of 5-FU/panitumumab  today.  The CEA has been higher over the past few months.  We will follow-up on the CEA from today.  He will undergo restaging CTs prior to an office visit in 2 weeks.  He will continue topical therapy for the areas of paronychia.  Arley Hof, MD  06/24/2024  8:55 AM

## 2024-06-24 NOTE — Patient Instructions (Signed)

## 2024-06-25 ENCOUNTER — Other Ambulatory Visit: Payer: Self-pay

## 2024-06-26 ENCOUNTER — Inpatient Hospital Stay

## 2024-06-26 VITALS — BP 118/81 | HR 103 | Temp 98.1°F | Resp 18

## 2024-06-26 DIAGNOSIS — C187 Malignant neoplasm of sigmoid colon: Secondary | ICD-10-CM

## 2024-07-03 ENCOUNTER — Ambulatory Visit (HOSPITAL_BASED_OUTPATIENT_CLINIC_OR_DEPARTMENT_OTHER)

## 2024-07-03 ENCOUNTER — Ambulatory Visit: Admission: RE | Admit: 2024-07-03 | Discharge: 2024-07-03 | Attending: Oncology | Admitting: Oncology

## 2024-07-03 DIAGNOSIS — C187 Malignant neoplasm of sigmoid colon: Secondary | ICD-10-CM

## 2024-07-03 MED ORDER — IOHEXOL 9 MG/ML PO SOLN: 500.0000 mL | ORAL | Status: AC

## 2024-07-03 MED ORDER — IOHEXOL 300 MG/ML  SOLN
100.0000 mL | Freq: Once | INTRAMUSCULAR | Status: AC | PRN
  Administered 2024-07-03: 100 mL via INTRAVENOUS

## 2024-07-04 ENCOUNTER — Other Ambulatory Visit: Payer: Self-pay | Admitting: Oncology

## 2024-07-08 ENCOUNTER — Other Ambulatory Visit: Payer: Self-pay

## 2024-07-08 ENCOUNTER — Inpatient Hospital Stay

## 2024-07-08 ENCOUNTER — Inpatient Hospital Stay (HOSPITAL_BASED_OUTPATIENT_CLINIC_OR_DEPARTMENT_OTHER): Admitting: Nurse Practitioner

## 2024-07-08 ENCOUNTER — Encounter: Payer: Self-pay | Admitting: Nurse Practitioner

## 2024-07-08 VITALS — BP 117/82 | HR 100 | Temp 98.1°F | Resp 18 | Ht 66.0 in | Wt 227.6 lb

## 2024-07-08 DIAGNOSIS — C187 Malignant neoplasm of sigmoid colon: Secondary | ICD-10-CM

## 2024-07-08 DIAGNOSIS — Z5112 Encounter for antineoplastic immunotherapy: Secondary | ICD-10-CM | POA: Diagnosis not present

## 2024-07-08 LAB — CMP (CANCER CENTER ONLY)
ALT: 38 U/L (ref 0–44)
AST: 51 U/L — ABNORMAL HIGH (ref 15–41)
Albumin: 3.8 g/dL (ref 3.5–5.0)
Alkaline Phosphatase: 209 U/L — ABNORMAL HIGH (ref 38–126)
Anion gap: 11 (ref 5–15)
BUN: 10 mg/dL (ref 6–20)
CO2: 26 mmol/L (ref 22–32)
Calcium: 8.9 mg/dL (ref 8.9–10.3)
Chloride: 102 mmol/L (ref 98–111)
Creatinine: 0.73 mg/dL (ref 0.61–1.24)
GFR, Estimated: >60 mL/min (ref >60)
Glucose, Bld: 154 mg/dL — ABNORMAL HIGH (ref 70–99)
Potassium: 3.9 mmol/L (ref 3.5–5.1)
Sodium: 139 mmol/L (ref 135–145)
Total Bilirubin: 0.5 mg/dL (ref 0.0–1.2)
Total Protein: 7.2 g/dL (ref 6.5–8.1)

## 2024-07-08 LAB — CBC WITH DIFFERENTIAL (CANCER CENTER ONLY)
Abs Immature Granulocytes: 0.04 K/uL (ref 0.00–0.07)
Basophils Absolute: 0.1 K/uL (ref 0.0–0.1)
Basophils Relative: 1 %
Eosinophils Absolute: 1.6 K/uL — ABNORMAL HIGH (ref 0.0–0.5)
Eosinophils Relative: 12 %
HCT: 43.1 % (ref 39.0–52.0)
Hemoglobin: 14.7 g/dL (ref 13.0–17.0)
Immature Granulocytes: 0 %
Lymphocytes Relative: 13 %
Lymphs Abs: 1.6 K/uL (ref 0.7–4.0)
MCH: 31.4 pg (ref 26.0–34.0)
MCHC: 34.1 g/dL (ref 30.0–36.0)
MCV: 92.1 fL (ref 80.0–100.0)
Monocytes Absolute: 0.8 K/uL (ref 0.1–1.0)
Monocytes Relative: 6 %
Neutro Abs: 8.7 K/uL — ABNORMAL HIGH (ref 1.7–7.7)
Neutrophils Relative %: 68 %
Platelet Count: 156 K/uL (ref 150–400)
RBC: 4.68 MIL/uL (ref 4.22–5.81)
RDW: 14.4 % (ref 11.5–15.5)
WBC Count: 12.8 K/uL — ABNORMAL HIGH (ref 4.0–10.5)
nRBC: 0 % (ref 0.0–0.2)

## 2024-07-08 LAB — CEA (ACCESS): CEA (CHCC): 95.5 ng/mL — ABNORMAL HIGH (ref 0.00–5.00)

## 2024-07-08 LAB — MAGNESIUM: Magnesium: 1.5 mg/dL — ABNORMAL LOW (ref 1.7–2.4)

## 2024-07-08 MED ORDER — MAGNESIUM SULFATE 2 GM/50ML IV SOLN
2.0000 g | Freq: Once | INTRAVENOUS | Status: AC
  Administered 2024-07-08: 10:00:00 2 g via INTRAVENOUS
  Filled 2024-07-08: qty 50

## 2024-07-08 NOTE — Progress Notes (Signed)
 DISCONTINUE ON PATHWAY REGIMEN - Colorectal     A cycle is every 14 days:     Bevacizumab-xxxx      Oxaliplatin       Leucovorin       Fluorouracil       Fluorouracil    **Always confirm dose/schedule in your pharmacy ordering system**  PRIOR TREATMENT: [id number]: [id number] + Bevacizumab q14 Days  START ON PATHWAY REGIMEN - Colorectal     A cycle is every 14 days:     Panitumumab       Irinotecan      Leucovorin       Fluorouracil       Fluorouracil    **Always confirm dose/schedule in your pharmacy ordering system**  Patient Characteristics: Distant Metastases, Nonsurgical Candidate, KRAS/NRAS Wild-Type (BRAF V600 Wild-Type/Unknown), Standard Cytotoxic Therapy, Second Line Standard Cytotoxic Therapy, Bevacizumab Ineligible Therapeutic Status: Distant Metastases Tumor Location: Colon BRAF Mutation Status: Wild-Type (no mutation) KRAS/NRAS Mutation Status: Wild-Type (no mutation) Microsatellite/Mismatch Repair Status: MSS/pMMR Preferred Therapy Approach: Standard Cytotoxic Therapy Standard Cytotoxic Line of Therapy: Second Line Standard Cytotoxic Therapy Bevacizumab Eligibility: Ineligible Intent of Therapy: Non-Curative / Palliative Intent, Discussed with Patient

## 2024-07-08 NOTE — Progress Notes (Unsigned)
 Patient seen by Olam Ned NP today  Vitals are within treatment parameters:Yes   Labs are within treatment parameters: Yes   Treatment plan has been signed: Yes   Per physician team, Patient will not be receiving treatment today. Patient will be receive 2g of IV mag.

## 2024-07-08 NOTE — Patient Instructions (Signed)
 Magnesium Sulfate Injection What is this medication? MAGNESIUM SULFATE (mag NEE zee um SUL fate) prevents and treats low levels of magnesium in your body. It may also be used to prevent and treat seizures during pregnancy in people with high blood pressure disorders, such as preeclampsia or eclampsia. Magnesium plays an important role in maintaining the health of your muscles and nervous system. This medicine may be used for other purposes; ask your health care provider or pharmacist if you have questions. What should I tell my care team before I take this medication? They need to know if you have any of these conditions: Heart disease History of irregular heart beat Kidney disease An unusual or allergic reaction to magnesium sulfate, medications, foods, dyes, or preservatives Pregnant or trying to get pregnant Breast-feeding How should I use this medication? This medication is for infusion into a vein. It is given in a hospital or clinic setting. Talk to your care team about the use of this medication in children. While this medication may be prescribed for selected conditions, precautions do apply. Overdosage: If you think you have taken too much of this medicine contact a poison control center or emergency room at once. NOTE: This medicine is only for you. Do not share this medicine with others. What if I miss a dose? This does not apply. What may interact with this medication? Certain medications for anxiety or sleep Certain medications for seizures, such phenobarbital Digoxin Medications that relax muscles for surgery Narcotic medications for pain This list may not describe all possible interactions. Give your health care provider a list of all the medicines, herbs, non-prescription drugs, or dietary supplements you use. Also tell them if you smoke, drink alcohol, or use illegal drugs. Some items may interact with your medicine. What should I watch for while using this  medication? Your condition will be monitored carefully while you are receiving this medication. You may need blood work done while you are receiving this medication. What side effects may I notice from receiving this medication? Side effects that you should report to your care team as soon as possible: Allergic reactions--skin rash, itching, hives, swelling of the face, lips, tongue, or throat High magnesium level--confusion, drowsiness, facial flushing, redness, sweating, muscle weakness, fast or irregular heartbeat, trouble breathing Low blood pressure--dizziness, feeling faint or lightheaded, blurry vision Side effects that usually do not require medical attention (report to your care team if they continue or are bothersome): Headache Nausea This list may not describe all possible side effects. Call your doctor for medical advice about side effects. You may report side effects to FDA at 1-800-FDA-1088. Where should I keep my medication? This medication is given in a hospital or clinic and will not be stored at home. NOTE: This sheet is a summary. It may not cover all possible information. If you have questions about this medicine, talk to your doctor, pharmacist, or health care provider.  2024 Elsevier/Gold Standard (2021-03-22 00:00:00)

## 2024-07-08 NOTE — Progress Notes (Unsigned)
 [facility] OFFICE PROGRESS NOTE   Diagnosis: Colon cancer  INTERVAL HISTORY:   [name] returns as scheduled.  He completed another cycle of 5-FU/Panitumumab  06/24/2024.  He denies nausea/vomiting.  No mouth sores.  No diarrhea.  Skin rash is better.  No numbness or tingling in the hands or feet.  No abdominal pain.  Objective:  Vital signs in last 24 hours:  Blood pressure 117/82, pulse 100, temperature 98.1 F (36.7 C), temperature source Temporal, resp. rate 18, height 5' 6 (1.676 m), weight 227 lb 9.6 oz (103.2 kg), SpO2 100%.    HEENT: No thrush or ulcers. Resp: Lungs clear bilaterally. Cardio: Regular rate and rhythm. GI: No hepatosplenomegaly.  Nontender. Vascular: No leg edema. Skin: Acne type rash anterior chest.   Port-A-Cath without erythema.  Lab Results:  Lab Results  Component Value Date   WBC 12.8 (H) 07/08/2024   HGB 14.7 07/08/2024   HCT 43.1 07/08/2024   MCV 92.1 07/08/2024   PLT 156 07/08/2024   NEUTROABS 8.7 (H) 07/08/2024    Imaging:  No results found.  Medications: I have reviewed the patient's current medications.  Assessment/Plan: Sigmoid colon mass, multiple liver lesions Ultrasound abdomen 01/08/2023-multifocal hyperechoic liver lesions, largest measuring 8.7 cm in the right hepatic lobe CT abdomen/pelvis 01/08/2023-multiple by lobar hypoattenuating hepatic lesions, masslike thickening of the sigmoid colon with abnormal spiculated pericolonic soft tissue.  IMA lymphadenopathy.  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 Colonoscopy 01/29/2023-malignant tumor in the sigmoid colon (invasive moderately differentiated adenocarcinoma), four 6-9 mm polyps in the rectum, ascending colon and  cecum, one 14 mm polyp in the sigmoid colon (within the rectal polyp biopsies there are 2 similar-appearing fragments showing invasive tumor with associated adenomatous change which is felt to be carryover from the sigmoid mass biopsies, the rectal polyp biopsies also show multiple fragments of tubular adenoma without high-grade dysplasia) Cycle 1 FOLFOX 01/30/2023 Cycle 2 FOLFOX 02/12/2023 Cycle 3 FOLFOX plus Panitumumab  02/27/2023 Cycle 4 FOLFOX plus Panitumumab  03/13/2023 Cycle 5 FOLFOX plus panitumumab  03/27/2023 Cycle 6 FOLFOX plus Panitumumab  04/10/2023 CTs 04/19/2023-decrease size of hepatic metastases, no new lesions, decreased right upper quadrant and retroperitoneal lymph nodes, sigmoid colonic wall thickening no longer seen with a decrease in sigmoid mesenteric lymph nodes Cycle 7 FOLFOX plus panitumumab  04/24/2023, oxaliplatin  dose reduced secondary to thrombocytopenia and neuropathy Cycle 8 FOLFOX plus Panitumumab  05/08/2023 Cycle 9 FOLFOX plus Panitumumab  05/22/2023 Cycle 10 FOLFOX plus Panitumumab  06/05/2023 Cycle 11 FOLFOX plus panitumumab  06/19/2023 Cycle 12 FOLFOX plus Panitumumab  07/03/2023, oxaliplatin  held due to thrombocytopenia CTs 07/11/2023: Decrease in hepatic metastases, no enlarged abdominal or pelvic nodes, no metastatic disease to the chest Cycle 13 of 5-FU/panitumumab  07/23/2023 Cycle 14 5-FU/Panitumumab  08/07/2023 Cycle 15 5-FU/panitumumab  08/21/2023 Cycle 16 5-FU/Panitumumab  09/04/2023 Cycle 17 5-FU/panitumumab  09/18/2023 Cycle 18 5-FU/Panitumumab  10/02/2023 Cycle 19 5-FU/Panitumumab  10/16/2023 CTs 10/24/2023: Decrease in hepatic metastases, no evidence of disease progression Cycle 20 5-FU/panitumumab  10/30/2023 Cycle 21 5-FU/Panitumumab  11/13/2023 Cycle 22 5-FU/Panitumumab  11/27/2023 Cycle 23 5-FU/panitumumab  12/11/2023 Cycle 24 5-FU/Panitumumab  12/25/2023 Cycle 25 5-FU/panitumumab  01/08/2024 CT 01/22/2024: Stable liver metastases, no evidence of disease progression Cycle 26  5-FU/panitumumab  01/29/2024 Cycle 27 5-FU/panitumumab  02/12/2024 Cycle 28 5-FU/panitumumab  02/26/2024 Cycle 29 5-FU/Panitumumab  03/11/2024 Cycle 30 5-FU/panitumumab  03/25/2024 Cycle 31 5-FU/panitumumab  04/08/2024 Cycle 32 5-FU/Panitumumab  04/22/2024 CT 04/29/2024: unchanged liver metastases, no evidence of progression. Liver lesions smaller on our measurement possibly from  contrast phase Cycle 33 5-FU/Panitumumab  05/06/2024 Cycle 34 5-FU/panitumumab  05/20/2024 Cycle 35 5-FU/Panitumumab  06/03/2024 Cycle 36 5-FU/panitumumab  06/24/2024 CTs 07/03/2024-progressive liver metastases Diabetes Cardiomyopathy Hypertension Hypercholesterolemia Weight loss secondary to Mounjaro, metastatic carcinoma? 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  He has completed 36 cycles of systemic therapy, most recently on maintenance 5-FU/Panitumumab .  CEA has increased over the past several months.  Recent restaging CT showed evidence of progressive disease in the liver.  Result/images reviewed with him and his wife at today's visit.  He understands and agrees with the recommendation to change treatment to FOLFIRI with continuation of Panitumumab .  We reviewed potential side effects associated with irinotecan including bone marrow toxicity, nausea, diarrhea (both early and late phase), hair loss.  He agrees with this change.  He was provided with printed information on irinotecan.  CBC and chemistry panel reviewed.  Persistent low magnesium .  He will receive IV magnesium  today.  He will return for follow-up and cycle 1 FOLFIRI/Panitumumab  in 2 weeks.  We are available to see him sooner if needed.  Patient seen with Dr. Cloretta.    Olam Ned ANP/GNP-BC   07/08/2024  8:44 AM  This was a shared visit with Olam Ned.  We reviewed the restaging CT findings and images with Daniel [name].  There is radiologic evidence of disease progression.   We discussed treatment options including recycled FOLFOX based therapy and FOLFIRI/panitumumab .  I recommend proceeding with FOLFIRI/panitumumab .  We reviewed potential toxicities associated with irinotecan including the chance of nausea/vomiting, alopecia, hematologic toxicity, and diarrhea.  He agrees to proceed.  A treatment plan was entered today.  Arvella Cloretta, MD

## 2024-07-08 NOTE — Patient Instructions (Signed)

## 2024-07-09 ENCOUNTER — Encounter: Payer: Self-pay | Admitting: *Deleted

## 2024-07-09 NOTE — Progress Notes (Signed)
 Her 2 testing requested on liver biopsy [phone]

## 2024-07-10 ENCOUNTER — Encounter: Payer: Self-pay | Admitting: Oncology

## 2024-07-10 ENCOUNTER — Inpatient Hospital Stay

## 2024-07-10 ENCOUNTER — Other Ambulatory Visit: Payer: Self-pay

## 2024-07-17 LAB — SURGICAL PATHOLOGY

## 2024-07-19 ENCOUNTER — Other Ambulatory Visit: Payer: Self-pay | Admitting: Oncology

## 2024-07-19 DIAGNOSIS — C187 Malignant neoplasm of sigmoid colon: Secondary | ICD-10-CM

## 2024-07-20 ENCOUNTER — Other Ambulatory Visit: Payer: Self-pay | Admitting: *Deleted

## 2024-07-20 DIAGNOSIS — C187 Malignant neoplasm of sigmoid colon: Secondary | ICD-10-CM

## 2024-07-21 MED ORDER — SODIUM CHLORIDE 0.9 % IV SOLN
2400.0000 mg/m2 | INTRAVENOUS | Status: DC
  Administered 2024-07-22: 5000 mg via INTRAVENOUS
  Filled 2024-07-21: qty 100

## 2024-07-21 MED ORDER — FLUOROURACIL CHEMO INJECTION 2.5 GM/50ML
400.0000 mg/m2 | Freq: Once | INTRAVENOUS | Status: AC
  Administered 2024-07-22: 900 mg via INTRAVENOUS
  Filled 2024-07-21: qty 18

## 2024-07-22 ENCOUNTER — Inpatient Hospital Stay

## 2024-07-22 ENCOUNTER — Encounter: Payer: Self-pay | Admitting: Nurse Practitioner

## 2024-07-22 ENCOUNTER — Inpatient Hospital Stay: Admitting: Nurse Practitioner

## 2024-07-22 VITALS — BP 102/80 | HR 98 | Temp 98.4°F | Resp 18

## 2024-07-22 VITALS — BP 98/72 | HR 100 | Temp 98.4°F | Resp 18 | Ht 66.0 in | Wt 229.4 lb

## 2024-07-22 DIAGNOSIS — C187 Malignant neoplasm of sigmoid colon: Secondary | ICD-10-CM

## 2024-07-22 DIAGNOSIS — Z5112 Encounter for antineoplastic immunotherapy: Secondary | ICD-10-CM | POA: Diagnosis not present

## 2024-07-22 LAB — CMP (CANCER CENTER ONLY)
ALT: 33 U/L (ref 0–44)
AST: 52 U/L — ABNORMAL HIGH (ref 15–41)
Albumin: 3.7 g/dL (ref 3.5–5.0)
Alkaline Phosphatase: 305 U/L — ABNORMAL HIGH (ref 38–126)
Anion gap: 10 (ref 5–15)
BUN: 14 mg/dL (ref 6–20)
CO2: 26 mmol/L (ref 22–32)
Calcium: 9.2 mg/dL (ref 8.9–10.3)
Chloride: 102 mmol/L (ref 98–111)
Creatinine: 0.77 mg/dL (ref 0.61–1.24)
GFR, Estimated: >60 mL/min
Glucose, Bld: 145 mg/dL — ABNORMAL HIGH (ref 70–99)
Potassium: 4.3 mmol/L (ref 3.5–5.1)
Sodium: 138 mmol/L (ref 135–145)
Total Bilirubin: 0.4 mg/dL (ref 0.0–1.2)
Total Protein: 7.9 g/dL (ref 6.5–8.1)

## 2024-07-22 LAB — CBC WITH DIFFERENTIAL (CANCER CENTER ONLY)
Abs Immature Granulocytes: 0.08 K/uL — ABNORMAL HIGH (ref 0.00–0.07)
Basophils Absolute: 0.1 K/uL (ref 0.0–0.1)
Basophils Relative: 1 %
Eosinophils Absolute: 1.7 K/uL — ABNORMAL HIGH (ref 0.0–0.5)
Eosinophils Relative: 11 %
HCT: 41.8 % (ref 39.0–52.0)
Hemoglobin: 13.7 g/dL (ref 13.0–17.0)
Immature Granulocytes: 1 %
Lymphocytes Relative: 8 %
Lymphs Abs: 1.2 K/uL (ref 0.7–4.0)
MCH: 30 pg (ref 26.0–34.0)
MCHC: 32.8 g/dL (ref 30.0–36.0)
MCV: 91.7 fL (ref 80.0–100.0)
Monocytes Absolute: 1.2 K/uL — ABNORMAL HIGH (ref 0.1–1.0)
Monocytes Relative: 8 %
Neutro Abs: 10.5 K/uL — ABNORMAL HIGH (ref 1.7–7.7)
Neutrophils Relative %: 71 %
Platelet Count: 209 K/uL (ref 150–400)
RBC: 4.56 MIL/uL (ref 4.22–5.81)
RDW: 13.9 % (ref 11.5–15.5)
WBC Count: 14.8 K/uL — ABNORMAL HIGH (ref 4.0–10.5)
nRBC: 0 % (ref 0.0–0.2)

## 2024-07-22 LAB — MAGNESIUM: Magnesium: 1.9 mg/dL (ref 1.7–2.4)

## 2024-07-22 LAB — CEA (ACCESS): CEA (CHCC): 201.66 ng/mL — ABNORMAL HIGH (ref 0.00–5.00)

## 2024-07-22 MED ORDER — SODIUM CHLORIDE 0.9 % IV SOLN
135.0000 mg/m2 | Freq: Once | INTRAVENOUS | Status: AC
  Administered 2024-07-22: 300 mg via INTRAVENOUS
  Filled 2024-07-22: qty 15

## 2024-07-22 MED ORDER — DEXAMETHASONE SOD PHOSPHATE PF 10 MG/ML IJ SOLN
10.0000 mg | Freq: Once | INTRAMUSCULAR | Status: AC
  Administered 2024-07-22: 10 mg via INTRAVENOUS

## 2024-07-22 MED ORDER — SODIUM CHLORIDE 0.9 % IV SOLN
6.0000 mg/kg | Freq: Once | INTRAVENOUS | Status: AC
  Administered 2024-07-22: 600 mg via INTRAVENOUS
  Filled 2024-07-22: qty 20

## 2024-07-22 MED ORDER — PALONOSETRON HCL INJECTION 0.25 MG/5ML
0.2500 mg | Freq: Once | INTRAVENOUS | Status: AC
  Administered 2024-07-22: 0.25 mg via INTRAVENOUS
  Filled 2024-07-22: qty 5

## 2024-07-22 MED ORDER — SODIUM CHLORIDE 0.9 % IV SOLN
400.0000 mg/m2 | Freq: Once | INTRAVENOUS | Status: AC
  Administered 2024-07-22: 876 mg via INTRAVENOUS
  Filled 2024-07-22: qty 43.8

## 2024-07-22 MED ORDER — SODIUM CHLORIDE 0.9 % IV SOLN: INTRAVENOUS | Status: DC

## 2024-07-22 MED ORDER — ATROPINE SULFATE 1 MG/ML IV SOLN
0.5000 mg | Freq: Once | INTRAVENOUS | Status: DC | PRN
  Filled 2024-07-22: qty 1

## 2024-07-22 NOTE — Progress Notes (Signed)
 Patient seen by Olam Ned NP today  Vitals are within treatment parameters:Yes  Increased 2 pounds Labs are within treatment parameters: Yes   Treatment plan has been signed: Yes   Per physician team, Patient is ready for treatment. Please note the following modifications:  Per provider Irinotecan dose being reduced.

## 2024-07-22 NOTE — Patient Instructions (Signed)
 [facility] - A DEPT OF [facility]  Discharge Instructions: Thank you for choosing [facility] to provide your oncology and hematology care.   If you have a lab appointment with the Cancer Center, please go directly to the Cancer Center and check in at the registration area.   Wear comfortable clothing and clothing appropriate for easy access to any Portacath or PICC line.   We strive to give you quality time with your provider. You may need to reschedule your appointment if you arrive late (15 or more minutes).  Arriving late affects you and other patients whose appointments are after yours.  Also, if you miss three or more appointments without notifying the office, you may be dismissed from the clinic at the providers discretion.      For prescription refill requests, have your pharmacy contact our office and allow 72 hours for refills to be completed.    Today you received the following chemotherapy and/or immunotherapy agents: Irinotecan, leucovorin , vectibix  and fluorouracil .      To help prevent nausea and vomiting after your treatment, we encourage you to take your nausea medication as directed.  BELOW ARE SYMPTOMS THAT SHOULD BE REPORTED IMMEDIATELY: *FEVER GREATER THAN 100.4 F (38 C) OR HIGHER *CHILLS OR SWEATING *NAUSEA AND VOMITING THAT IS NOT CONTROLLED WITH YOUR NAUSEA MEDICATION *UNUSUAL SHORTNESS OF BREATH *UNUSUAL BRUISING OR BLEEDING *URINARY PROBLEMS (pain or burning when urinating, or frequent urination) *BOWEL PROBLEMS (unusual diarrhea, constipation, pain near the anus) TENDERNESS IN MOUTH AND THROAT WITH OR WITHOUT PRESENCE OF ULCERS (sore throat, sores in mouth, or a toothache) UNUSUAL RASH, SWELLING OR PAIN  UNUSUAL VAGINAL DISCHARGE OR ITCHING   Items with * indicate a potential emergency and should be followed up as soon as possible or go to the Emergency Department if any problems should occur.  Please show the  CHEMOTHERAPY ALERT CARD or IMMUNOTHERAPY ALERT CARD at check-in to the Emergency Department and triage nurse.  Should you have questions after your visit or need to cancel or reschedule your appointment, please contact [facility] - A DEPT OF [facility]  Dept: [phone]  and follow the prompts.  Office hours are 8:00 a.m. to 4:30 p.m. Monday - Friday. Please note that voicemails left after 4:00 p.m. may not be returned until the following business day.  We are closed weekends and major holidays. You have access to a nurse at all times for urgent questions. Please call the main number to the clinic Dept: [phone] and follow the prompts.   For any non-urgent questions, you may also contact your provider using MyChart. We now offer e-Visits for anyone 81 and older to request care online for non-urgent symptoms. For details visit [email or web address].   Also download the MyChart app! Go to the app store, search MyChart, open the app, select [facility], and log in with your MyChart username and password.  Irinotecan Injection What is this medication? IRINOTECAN (ir in oh TEE kan) treats some types of cancer. It works by slowing down the growth of cancer cells. This medicine may be used for other purposes; ask your health care provider or pharmacist if you have questions. COMMON BRAND NAME(S): Camptosar What should I tell my care team before I take this medication? They need to know if you have any of these conditions: Dehydration Diarrhea Infection, especially a viral infection, such as chickenpox, cold sores, herpes Liver disease Low blood cell levels (  white cells, red cells, and platelets) Low levels of electrolytes, such as calcium , magnesium , or potassium in your blood Recent or ongoing radiation An unusual or allergic reaction to irinotecan, other medications, foods, dyes, or preservatives If you or your partner are pregnant or trying to get  pregnant Breast-feeding How should I use this medication? This medication is injected into a vein. It is given by your care team in a hospital or clinic setting. Talk to your care team about the use of this medication in children. Special care may be needed. Overdosage: If you think you have taken too much of this medicine contact a poison control center or emergency room at once. NOTE: This medicine is only for you. Do not share this medicine with others. What if I miss a dose? Keep appointments for follow-up doses. It is important not to miss your dose. Call your care team if you are unable to keep an appointment. What may interact with this medication? Do not take this medication with any of the following: Cobicistat Itraconazole This medication may also interact with the following: Certain antibiotics, such as clarithromycin, rifampin, rifabutin Certain antivirals for HIV or AIDS Certain medications for fungal infections, such as ketoconazole, posaconazole, voriconazole Certain medications for seizures, such as carbamazepine, phenobarbital, phenytoin Gemfibrozil Nefazodone St. John's wort This list may not describe all possible interactions. Give your health care provider a list of all the medicines, herbs, non-prescription drugs, or dietary supplements you use. Also tell them if you smoke, drink alcohol, or use illegal drugs. Some items may interact with your medicine. What should I watch for while using this medication? Your condition will be monitored carefully while you are receiving this medication. You may need blood work while taking this medication. This medication may make you feel generally unwell. This is not uncommon as chemotherapy can affect healthy cells as well as cancer cells. Report any side effects. Continue your course of treatment even though you feel ill unless your care team tells you to stop. This medication can cause serious side effects. To reduce the risk, your  care team may give you other medications to take before receiving this one. Be sure to follow the directions from your care team. This medication may affect your coordination, reaction time, or judgement. Do not drive or operate machinery until you know how this medication affects you. Sit up or stand slowly to reduce the risk of dizzy or fainting spells. Drinking alcohol with this medication can increase the risk of these side effects. This medication may increase your risk of getting an infection. Call your care team for advice if you get a fever, chills, sore throat, or other symptoms of a cold or flu. Do not treat yourself. Try to avoid being around people who are sick. Avoid taking medications that contain aspirin , acetaminophen , ibuprofen, naproxen, or ketoprofen unless instructed by your care team. These medications may hide a fever. This medication may increase your risk to bruise or bleed. Call your care team if you notice any unusual bleeding. Be careful brushing or flossing your teeth or using a toothpick because you may get an infection or bleed more easily. If you have any dental work done, tell your dentist you are receiving this medication. Talk to your care team if you or your partner are pregnant or think either of you might be pregnant. This medication can cause serious birth defects if taken during pregnancy and for 6 months after the last dose. You will need a negative  pregnancy test before starting this medication. Contraception is recommended while taking this medication and for 6 months after the last dose. Your care team can help you find the option that works for you. Do not father a child while taking this medication and for 3 months after the last dose. Use a condom for contraception during this time period. Do not breastfeed while taking this medication and for 7 days after the last dose. This medication may cause infertility. Talk to your care team if you are concerned about  your fertility. What side effects may I notice from receiving this medication? Side effects that you should report to your care team as soon as possible: Allergic reactions--skin rash, itching, hives, swelling of the face, lips, tongue, or throat Dry cough, shortness of breath or trouble breathing Increased saliva or tears, increased sweating, stomach cramping, diarrhea, small pupils, unusual weakness or fatigue, slow heartbeat Infection--fever, chills, cough, sore throat, wounds that don't heal, pain or trouble when passing urine, general feeling of discomfort or being unwell Kidney injury--decrease in the amount of urine, swelling of the ankles, hands, or feet Low red blood cell level--unusual weakness or fatigue, dizziness, headache, trouble breathing Severe or prolonged diarrhea Unusual bruising or bleeding Side effects that usually do not require medical attention (report to your care team if they continue or are bothersome): Constipation Diarrhea Hair loss Loss of appetite Nausea Stomach pain This list may not describe all possible side effects. Call your doctor for medical advice about side effects. You may report side effects to FDA at 1-800-FDA-1088. Where should I keep my medication? This medication is given in a hospital or clinic. It will not be stored at home. NOTE: This sheet is a summary. It may not cover all possible information. If you have questions about this medicine, talk to your doctor, pharmacist, or health care provider.  2024 Elsevier/Gold Standard (2021-11-20 00:00:00)

## 2024-07-22 NOTE — Patient Instructions (Signed)

## 2024-07-22 NOTE — Progress Notes (Signed)
 " [facility] OFFICE PROGRESS NOTE   Diagnosis: Colon cancer  INTERVAL HISTORY:   [name] returns as scheduled.  He is seen today prior to proceeding with cycle 1 FOLFIRI plus panitumumab .  Overall feels well.  Skin rash is better.  No nausea or vomiting.  No mouth sores.  No diarrhea.  No abdominal pain.  Objective:  Vital signs in last 24 hours:  Blood pressure 98/72, pulse 100, temperature 98.4 F (36.9 C), temperature source Temporal, resp. rate 18, height 5' 6 (1.676 m), weight 229 lb 6.4 oz (104.1 kg), SpO2 100%.    HEENT: No thrush or ulcers. Resp: Lungs clear bilaterally. Cardio: Regular rate and rhythm. GI: No hepatosplenomegaly.  Nontender. Vascular: No leg edema. Neuro: Alert and oriented. Skin: Mild acne type rash on the trunk. Port-A-Cath without erythema.  Lab Results:  Lab Results  Component Value Date   WBC 14.8 (H) 07/22/2024   HGB 13.7 07/22/2024   HCT 41.8 07/22/2024   MCV 91.7 07/22/2024   PLT 209 07/22/2024   NEUTROABS 10.5 (H) 07/22/2024    Imaging:  No results found.  Medications: I have reviewed the patient's current medications.  Assessment/Plan: Sigmoid colon mass, multiple liver lesions Ultrasound abdomen 01/08/2023-multifocal hyperechoic liver lesions, largest measuring 8.7 cm in the right hepatic lobe CT abdomen/pelvis 01/08/2023-multiple by lobar hypoattenuating hepatic lesions, masslike thickening of the sigmoid colon with abnormal spiculated pericolonic soft tissue.  IMA lymphadenopathy.  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  Tumor cells are positive for HER2 (3+) Colonoscopy 01/29/2023-malignant tumor in the sigmoid colon (invasive moderately differentiated adenocarcinoma),  four 6-9 mm polyps in the rectum, ascending colon and cecum, one 14 mm polyp in the sigmoid colon (within the rectal polyp biopsies there are 2 similar-appearing fragments showing invasive tumor with associated adenomatous change which is felt to be carryover from the sigmoid mass biopsies, the rectal polyp biopsies also show multiple fragments of tubular adenoma without high-grade dysplasia) Cycle 1 FOLFOX 01/30/2023 Cycle 2 FOLFOX 02/12/2023 Cycle 3 FOLFOX plus Panitumumab  02/27/2023 Cycle 4 FOLFOX plus Panitumumab  03/13/2023 Cycle 5 FOLFOX plus panitumumab  03/27/2023 Cycle 6 FOLFOX plus Panitumumab  04/10/2023 CTs 04/19/2023-decrease size of hepatic metastases, no new lesions, decreased right upper quadrant and retroperitoneal lymph nodes, sigmoid colonic wall thickening no longer seen with a decrease in sigmoid mesenteric lymph nodes Cycle 7 FOLFOX plus panitumumab  04/24/2023, oxaliplatin  dose reduced secondary to thrombocytopenia and neuropathy Cycle 8 FOLFOX plus Panitumumab  05/08/2023 Cycle 9 FOLFOX plus Panitumumab  05/22/2023 Cycle 10 FOLFOX plus Panitumumab  06/05/2023 Cycle 11 FOLFOX plus panitumumab  06/19/2023 Cycle 12 FOLFOX plus Panitumumab  07/03/2023, oxaliplatin  held due to thrombocytopenia CTs 07/11/2023: Decrease in hepatic metastases, no enlarged abdominal or pelvic nodes, no metastatic disease to the chest Cycle 13 of 5-FU/panitumumab  07/23/2023 Cycle 14 5-FU/Panitumumab  08/07/2023 Cycle 15 5-FU/panitumumab  08/21/2023 Cycle 16 5-FU/Panitumumab  09/04/2023 Cycle 17 5-FU/panitumumab  09/18/2023 Cycle 18 5-FU/Panitumumab  10/02/2023 Cycle 19 5-FU/Panitumumab  10/16/2023 CTs 10/24/2023: Decrease in hepatic metastases, no evidence of disease progression Cycle 20 5-FU/panitumumab  10/30/2023 Cycle 21 5-FU/Panitumumab  11/13/2023 Cycle 22 5-FU/Panitumumab  11/27/2023 Cycle 23 5-FU/panitumumab  12/11/2023 Cycle 24 5-FU/Panitumumab  12/25/2023 Cycle 25 5-FU/panitumumab  01/08/2024 CT 01/22/2024: Stable liver  metastases, no evidence of disease progression Cycle 26 5-FU/panitumumab  01/29/2024 Cycle 27 5-FU/panitumumab  02/12/2024 Cycle 28 5-FU/panitumumab  02/26/2024 Cycle 29 5-FU/Panitumumab  03/11/2024 Cycle 30 5-FU/panitumumab  03/25/2024 Cycle 31 5-FU/panitumumab  04/08/2024 Cycle 32 5-FU/Panitumumab  04/22/2024 CT 04/29/2024: unchanged  liver metastases, no evidence of progression. Liver lesions smaller on our measurement possibly from contrast phase Cycle 33 5-FU/Panitumumab  05/06/2024 Cycle 34 5-FU/panitumumab  05/20/2024 Cycle 35 5-FU/Panitumumab  06/03/2024 Cycle 36 5-FU/panitumumab  06/24/2024 CTs 07/03/2024-progressive liver metastases Cycle 1 FOLFIRI plus Panitumumab  07/22/2024, irinotecan dose reduced due to pending metabolism test Diabetes Cardiomyopathy Hypertension Hypercholesterolemia Weight loss secondary to Mounjaro, metastatic carcinoma? 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  He is scheduled to begin treatment today with FOLFIRI plus Panitumumab .  We again reviewed potential toxicities.  He agrees to proceed.  Irinotecan metabolism test result is pending.  Irinotecan will be dose reduced today.  We reviewed the positive Her2 test result.  CBC and chemistry panel reviewed.  Labs are adequate for treatment.  Magnesium  is in normal range.  He will return for follow-up and cycle 2 FOLFIRI plus panitumumab  in 2 weeks.  We are available to see him sooner if needed.  Patient seen with Dr. Cloretta.   Olam Ned ANP/GNP-BC   07/22/2024  8:34 AM  This was a shared visit with Olam Ned.  Daniel [name] is scheduled to begin FOLFIRI/panitumumab  today.  UGA testing is pending.  He understands the potential increased risk for toxicity with irinotecan if he is a slow metabolizer.  We decided to dose reduce the irinotecan for the first cycle of chemotherapy.  He agrees to proceed.  We discussed the HER2 result.   I explained there is some data to suggest patients with HER2 positive disease do not respond well to panitumumab  based therapy, but he had an initial excellent response with FOLFOX/panitumumab .  I recommend continuing panitumumab .  He will be a candidate for Trastuzumab/Tucatinib in the future.  Arvella Cloretta, MD      "

## 2024-07-24 ENCOUNTER — Inpatient Hospital Stay: Attending: Oncology

## 2024-07-24 VITALS — BP 115/90 | HR 97 | Temp 98.3°F | Resp 18

## 2024-07-24 DIAGNOSIS — Z79634 Long term (current) use of topoisomerase inhibitor: Secondary | ICD-10-CM | POA: Insufficient documentation

## 2024-07-24 DIAGNOSIS — C187 Malignant neoplasm of sigmoid colon: Secondary | ICD-10-CM | POA: Insufficient documentation

## 2024-07-24 DIAGNOSIS — Z5111 Encounter for antineoplastic chemotherapy: Secondary | ICD-10-CM | POA: Insufficient documentation

## 2024-07-24 DIAGNOSIS — C787 Secondary malignant neoplasm of liver and intrahepatic bile duct: Secondary | ICD-10-CM | POA: Insufficient documentation

## 2024-07-24 DIAGNOSIS — Z5112 Encounter for antineoplastic immunotherapy: Secondary | ICD-10-CM | POA: Insufficient documentation

## 2024-07-24 DIAGNOSIS — Z79631 Long term (current) use of antimetabolite agent: Secondary | ICD-10-CM | POA: Insufficient documentation

## 2024-07-24 MED ORDER — SODIUM CHLORIDE 0.9% FLUSH: 10.0000 mL | INTRAVENOUS | Status: DC | PRN

## 2024-07-24 NOTE — Patient Instructions (Signed)

## 2024-08-01 ENCOUNTER — Other Ambulatory Visit: Payer: Self-pay | Admitting: Oncology

## 2024-08-01 DIAGNOSIS — C187 Malignant neoplasm of sigmoid colon: Secondary | ICD-10-CM

## 2024-08-05 ENCOUNTER — Inpatient Hospital Stay: Admitting: Nurse Practitioner

## 2024-08-05 ENCOUNTER — Inpatient Hospital Stay

## 2024-08-05 ENCOUNTER — Encounter: Payer: Self-pay | Admitting: Nurse Practitioner

## 2024-08-05 VITALS — BP 96/72 | HR 100 | Temp 98.2°F | Resp 18 | Ht 66.0 in | Wt 229.0 lb

## 2024-08-05 VITALS — BP 107/69 | HR 87 | Temp 98.0°F | Resp 15

## 2024-08-05 DIAGNOSIS — C187 Malignant neoplasm of sigmoid colon: Secondary | ICD-10-CM

## 2024-08-05 DIAGNOSIS — C787 Secondary malignant neoplasm of liver and intrahepatic bile duct: Secondary | ICD-10-CM | POA: Diagnosis not present

## 2024-08-05 DIAGNOSIS — Z79631 Long term (current) use of antimetabolite agent: Secondary | ICD-10-CM | POA: Diagnosis not present

## 2024-08-05 DIAGNOSIS — Z79634 Long term (current) use of topoisomerase inhibitor: Secondary | ICD-10-CM | POA: Diagnosis not present

## 2024-08-05 DIAGNOSIS — Z5111 Encounter for antineoplastic chemotherapy: Secondary | ICD-10-CM | POA: Diagnosis present

## 2024-08-05 DIAGNOSIS — Z5112 Encounter for antineoplastic immunotherapy: Secondary | ICD-10-CM | POA: Diagnosis present

## 2024-08-05 LAB — CBC WITH DIFFERENTIAL (CANCER CENTER ONLY)
Abs Immature Granulocytes: 0.02 K/uL (ref 0.00–0.07)
Basophils Absolute: 0.1 K/uL (ref 0.0–0.1)
Basophils Relative: 1 %
Eosinophils Absolute: 0.9 K/uL — ABNORMAL HIGH (ref 0.0–0.5)
Eosinophils Relative: 9 %
HCT: 43.4 % (ref 39.0–52.0)
Hemoglobin: 14.4 g/dL (ref 13.0–17.0)
Immature Granulocytes: 0 %
Lymphocytes Relative: 15 %
Lymphs Abs: 1.6 K/uL (ref 0.7–4.0)
MCH: 30 pg (ref 26.0–34.0)
MCHC: 33.2 g/dL (ref 30.0–36.0)
MCV: 90.4 fL (ref 80.0–100.0)
Monocytes Absolute: 0.8 K/uL (ref 0.1–1.0)
Monocytes Relative: 7 %
Neutro Abs: 7 K/uL (ref 1.7–7.7)
Neutrophils Relative %: 68 %
Platelet Count: 173 K/uL (ref 150–400)
RBC: 4.8 MIL/uL (ref 4.22–5.81)
RDW: 14.1 % (ref 11.5–15.5)
WBC Count: 10.4 K/uL (ref 4.0–10.5)
nRBC: 0 % (ref 0.0–0.2)

## 2024-08-05 LAB — CMP (CANCER CENTER ONLY)
ALT: 36 U/L (ref 0–44)
AST: 37 U/L (ref 15–41)
Albumin: 3.8 g/dL (ref 3.5–5.0)
Alkaline Phosphatase: 222 U/L — ABNORMAL HIGH (ref 38–126)
Anion gap: 12 (ref 5–15)
BUN: 15 mg/dL (ref 6–20)
CO2: 26 mmol/L (ref 22–32)
Calcium: 9.1 mg/dL (ref 8.9–10.3)
Chloride: 100 mmol/L (ref 98–111)
Creatinine: 0.73 mg/dL (ref 0.61–1.24)
GFR, Estimated: >60 mL/min
Glucose, Bld: 212 mg/dL — ABNORMAL HIGH (ref 70–99)
Potassium: 3.8 mmol/L (ref 3.5–5.1)
Sodium: 138 mmol/L (ref 135–145)
Total Bilirubin: 0.4 mg/dL (ref 0.0–1.2)
Total Protein: 7.5 g/dL (ref 6.5–8.1)

## 2024-08-05 LAB — MAGNESIUM: Magnesium: 1.9 mg/dL (ref 1.7–2.4)

## 2024-08-05 LAB — MISC LABCORP TEST (SEND OUT): Labcorp test code: 511200

## 2024-08-05 LAB — CEA (ACCESS): CEA (CHCC): 119.11 ng/mL — ABNORMAL HIGH (ref 0.00–5.00)

## 2024-08-05 MED ORDER — FLUOROURACIL CHEMO INJECTION 2.5 GM/50ML
400.0000 mg/m2 | Freq: Once | INTRAVENOUS | Status: AC
  Administered 2024-08-05: 900 mg via INTRAVENOUS
  Filled 2024-08-05: qty 18

## 2024-08-05 MED ORDER — SODIUM CHLORIDE 0.9 % IV SOLN: INTRAVENOUS | Status: DC

## 2024-08-05 MED ORDER — SODIUM CHLORIDE 0.9 % IV SOLN
2400.0000 mg/m2 | INTRAVENOUS | Status: DC
  Administered 2024-08-05: 5000 mg via INTRAVENOUS
  Filled 2024-08-05: qty 100

## 2024-08-05 MED ORDER — SODIUM CHLORIDE 0.9 % IV SOLN
400.0000 mg/m2 | Freq: Once | INTRAVENOUS | Status: AC
  Administered 2024-08-05: 876 mg via INTRAVENOUS
  Filled 2024-08-05: qty 43.8

## 2024-08-05 MED ORDER — SODIUM CHLORIDE 0.9 % IV SOLN
135.0000 mg/m2 | Freq: Once | INTRAVENOUS | Status: AC
  Administered 2024-08-05: 300 mg via INTRAVENOUS
  Filled 2024-08-05: qty 15

## 2024-08-05 MED ORDER — DEXAMETHASONE SOD PHOSPHATE PF 10 MG/ML IJ SOLN
10.0000 mg | Freq: Once | INTRAMUSCULAR | Status: AC
  Administered 2024-08-05: 10 mg via INTRAVENOUS
  Filled 2024-08-05: qty 1

## 2024-08-05 MED ORDER — SODIUM CHLORIDE 0.9 % IV SOLN
6.0000 mg/kg | Freq: Once | INTRAVENOUS | Status: AC
  Administered 2024-08-05: 600 mg via INTRAVENOUS
  Filled 2024-08-05: qty 20

## 2024-08-05 MED ORDER — PALONOSETRON HCL INJECTION 0.25 MG/5ML
0.2500 mg | Freq: Once | INTRAVENOUS | Status: AC
  Administered 2024-08-05: 0.25 mg via INTRAVENOUS
  Filled 2024-08-05: qty 5

## 2024-08-05 NOTE — Patient Instructions (Signed)
 [facility] - A DEPT OF [place]  Discharge Instructions: Thank you for choosing [facility] to provide your oncology and hematology care.   If you have a lab appointment with the Cancer Center, please go directly to the Cancer Center and check in at the registration area.   Wear comfortable clothing and clothing appropriate for easy access to any Portacath or PICC line.   We strive to give you quality time with your provider. You may need to reschedule your appointment if you arrive late (15 or more minutes).  Arriving late affects you and other patients whose appointments are after yours.  Also, if you miss three or more appointments without notifying the office, you may be dismissed from the clinic at the providers discretion.      For prescription refill requests, have your pharmacy contact our office and allow 72 hours for refills to be completed.    Today you received the following chemotherapy and/or immunotherapy agents: vectibix , irinotecan , leucovorin , fluorouracil        To help prevent nausea and vomiting after your treatment, we encourage you to take your nausea medication as directed.  BELOW ARE SYMPTOMS THAT SHOULD BE REPORTED IMMEDIATELY: *FEVER GREATER THAN 100.4 F (38 C) OR HIGHER *CHILLS OR SWEATING *NAUSEA AND VOMITING THAT IS NOT CONTROLLED WITH YOUR NAUSEA MEDICATION *UNUSUAL SHORTNESS OF BREATH *UNUSUAL BRUISING OR BLEEDING *URINARY PROBLEMS (pain or burning when urinating, or frequent urination) *BOWEL PROBLEMS (unusual diarrhea, constipation, pain near the anus) TENDERNESS IN MOUTH AND THROAT WITH OR WITHOUT PRESENCE OF ULCERS (sore throat, sores in mouth, or a toothache) UNUSUAL RASH, SWELLING OR PAIN  UNUSUAL VAGINAL DISCHARGE OR ITCHING   Items with * indicate a potential emergency and should be followed up as soon as possible or go to the Emergency Department if any problems should occur.  Please show the  CHEMOTHERAPY ALERT CARD or IMMUNOTHERAPY ALERT CARD at check-in to the Emergency Department and triage nurse.  Should you have questions after your visit or need to cancel or reschedule your appointment, please contact [facility]  Dept: [phone]  and follow the prompts.  Office hours are 8:00 a.m. to 4:30 p.m. Monday - Friday. Please note that voicemails left after 4:00 p.m. may not be returned until the following business day.  We are closed weekends and major holidays. You have access to a nurse at all times for urgent questions. Please call the main number to the clinic Dept: [phone] and follow the prompts.   For any non-urgent questions, you may also contact your provider using MyChart. We now offer e-Visits for anyone 68 and older to request care online for non-urgent symptoms. For details visit [email or web address].   Also download the MyChart app! Go to the app store, search MyChart, open the app, select [place], and log in with your MyChart username and password.

## 2024-08-05 NOTE — Progress Notes (Signed)
 " [facility] OFFICE PROGRESS NOTE   Diagnosis: Colon cancer  INTERVAL HISTORY:   [name] returns as scheduled.  He completed cycle 1 FOLFIRI plus Panitumumab  07/22/2024.  He denies nausea/vomiting.  No mouth sores.  No diarrhea.  He notes increase in skin dryness.  He was fatigued for about 2 days.  No hand or foot pain or redness.  Objective:  Vital signs in last 24 hours:  Blood pressure 96/72, pulse 100, temperature 98.2 F (36.8 C), temperature source Temporal, resp. rate 18, height 5' 6 (1.676 m), weight 229 lb (103.9 kg), SpO2 100%.    HEENT: No thrush or ulcers. Resp: Lungs clear bilaterally. Cardio: Regular rate and rhythm. GI: No hepatosplenomegaly.  Nontender. Vascular: No leg edema. Neuro: Alert and oriented. Skin: Skin in general has a dry appearance.  Palms without erythema.  No paronychia at the fingernail beds.  Mild acne type rash face and trunk. Port-A-Cath without erythema.  Lab Results:  Lab Results  Component Value Date   WBC 10.4 08/05/2024   HGB 14.4 08/05/2024   HCT 43.4 08/05/2024   MCV 90.4 08/05/2024   PLT 173 08/05/2024   NEUTROABS 7.0 08/05/2024    Imaging:  No results found.  Medications: I have reviewed the patient's current medications.  Assessment/Plan: Sigmoid colon mass, multiple liver lesions Ultrasound abdomen 01/08/2023-multifocal hyperechoic liver lesions, largest measuring 8.7 cm in the right hepatic lobe CT abdomen/pelvis 01/08/2023-multiple by lobar hypoattenuating hepatic lesions, masslike thickening of the sigmoid colon with abnormal spiculated pericolonic soft tissue.  IMA lymphadenopathy.  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  Tumor cells are positive for HER2  (3+) Colonoscopy 01/29/2023-malignant tumor in the sigmoid colon (invasive moderately differentiated adenocarcinoma), four 6-9 mm polyps in the rectum, ascending colon and cecum, one 14 mm polyp in the sigmoid colon (within the rectal polyp biopsies there are 2 similar-appearing fragments showing invasive tumor with associated adenomatous change which is felt to be carryover from the sigmoid mass biopsies, the rectal polyp biopsies also show multiple fragments of tubular adenoma without high-grade dysplasia) Cycle 1 FOLFOX 01/30/2023 Cycle 2 FOLFOX 02/12/2023 Cycle 3 FOLFOX plus Panitumumab  02/27/2023 Cycle 4 FOLFOX plus Panitumumab  03/13/2023 Cycle 5 FOLFOX plus panitumumab  03/27/2023 Cycle 6 FOLFOX plus Panitumumab  04/10/2023 CTs 04/19/2023-decrease size of hepatic metastases, no new lesions, decreased right upper quadrant and retroperitoneal lymph nodes, sigmoid colonic wall thickening no longer seen with a decrease in sigmoid mesenteric lymph nodes Cycle 7 FOLFOX plus panitumumab  04/24/2023, oxaliplatin  dose reduced secondary to thrombocytopenia and neuropathy Cycle 8 FOLFOX plus Panitumumab  05/08/2023 Cycle 9 FOLFOX plus Panitumumab  05/22/2023 Cycle 10 FOLFOX plus Panitumumab  06/05/2023 Cycle 11 FOLFOX plus panitumumab  06/19/2023 Cycle 12 FOLFOX plus Panitumumab  07/03/2023, oxaliplatin  held due to thrombocytopenia CTs 07/11/2023: Decrease in hepatic metastases, no enlarged abdominal or pelvic nodes, no metastatic disease to the chest Cycle 13 of 5-FU/panitumumab  07/23/2023 Cycle 14 5-FU/Panitumumab  08/07/2023 Cycle 15 5-FU/panitumumab  08/21/2023 Cycle 16 5-FU/Panitumumab  09/04/2023 Cycle 17 5-FU/panitumumab  09/18/2023 Cycle 18 5-FU/Panitumumab  10/02/2023 Cycle 19 5-FU/Panitumumab  10/16/2023 CTs 10/24/2023: Decrease in hepatic metastases, no evidence of disease progression Cycle 20 5-FU/panitumumab  10/30/2023 Cycle 21 5-FU/Panitumumab  11/13/2023 Cycle 22 5-FU/Panitumumab  11/27/2023 Cycle 23 5-FU/panitumumab   12/11/2023 Cycle 24 5-FU/Panitumumab  12/25/2023 Cycle 25 5-FU/panitumumab  01/08/2024 CT 01/22/2024: Stable liver metastases, no evidence of disease progression Cycle 26 5-FU/panitumumab  01/29/2024 Cycle 27 5-FU/panitumumab  02/12/2024 Cycle 28 5-FU/panitumumab  02/26/2024  Cycle 29 5-FU/Panitumumab  03/11/2024 Cycle 30 5-FU/panitumumab  03/25/2024 Cycle 31 5-FU/panitumumab  04/08/2024 Cycle 32 5-FU/Panitumumab  04/22/2024 CT 04/29/2024: unchanged liver metastases, no evidence of progression. Liver lesions smaller on our measurement possibly from contrast phase Cycle 33 5-FU/Panitumumab  05/06/2024 Cycle 34 5-FU/panitumumab  05/20/2024 Cycle 35 5-FU/Panitumumab  06/03/2024 Cycle 36 5-FU/panitumumab  06/24/2024 CTs 07/03/2024-progressive liver metastases Cycle 1 FOLFIRI plus Panitumumab  07/22/2024, irinotecan  dose reduced due to pending metabolism test Cycle 2 FOLFIRI plus Panitumumab  08/05/2024, irinotecan  metabolism test pending Diabetes Cardiomyopathy Hypertension Hypercholesterolemia Weight loss secondary to Mounjaro, metastatic carcinoma? 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 appears stable.  He has completed 1 cycle of FOLFIRI plus Panitumumab .  He tolerated well.  Plan to proceed with cycle 2 today as scheduled, same irinotecan  dose.  CBC and chemistry panel reviewed.  Labs are adequate for treatment.  Magnesium  is in normal range.  CEA is lower.  He will return for follow-up and cycle 3 in 2 weeks.  We are available to see him sooner if needed.    Olam Ned ANP/GNP-BC   08/05/2024  8:49 AM        "

## 2024-08-05 NOTE — Patient Instructions (Signed)

## 2024-08-05 NOTE — Progress Notes (Signed)
 Patient seen by Olam Ned NP today  Vitals are within treatment parameters:Yes   Labs are within treatment parameters: Yes   Treatment plan has been signed: Yes   Per physician team, Patient is ready for treatment and there are NO modifications to the treatment plan.  No changes per Lisa/NP

## 2024-08-07 ENCOUNTER — Inpatient Hospital Stay

## 2024-08-07 ENCOUNTER — Other Ambulatory Visit: Payer: Self-pay

## 2024-08-07 VITALS — BP 98/77 | HR 98 | Temp 98.8°F | Resp 16

## 2024-08-07 DIAGNOSIS — C187 Malignant neoplasm of sigmoid colon: Secondary | ICD-10-CM

## 2024-08-07 MED ORDER — SODIUM CHLORIDE 0.9% FLUSH: 10.0000 mL | INTRAVENOUS | Status: DC | PRN

## 2024-08-07 NOTE — Patient Instructions (Signed)

## 2024-08-08 ENCOUNTER — Other Ambulatory Visit: Payer: Self-pay

## 2024-08-16 ENCOUNTER — Other Ambulatory Visit: Payer: Self-pay | Admitting: Oncology

## 2024-08-18 ENCOUNTER — Telehealth: Payer: Self-pay | Admitting: *Deleted

## 2024-08-18 NOTE — Telephone Encounter (Addendum)
 Mr. Harty called to report road conditions where they live are much worse than in [place]. He is asking if it would be safe for him to cancel tomorrow and plan for next treatment to be on 09/01/24 as scheduled? Called Mr. Negron to inform him that Dr. Cloretta prefers not to have a month between treatments. If he is unable to come tomorrow, would be best to have treatment next week. Mr. Bolar has decided to come tomorrow as scheduled.

## 2024-08-19 ENCOUNTER — Inpatient Hospital Stay: Admitting: Oncology

## 2024-08-19 ENCOUNTER — Encounter: Payer: Self-pay | Admitting: Oncology

## 2024-08-19 ENCOUNTER — Inpatient Hospital Stay

## 2024-08-19 ENCOUNTER — Other Ambulatory Visit: Payer: Self-pay | Admitting: Oncology

## 2024-08-19 VITALS — BP 113/70 | HR 91 | Temp 98.4°F | Resp 18

## 2024-08-19 VITALS — BP 114/81 | HR 105 | Temp 98.3°F | Resp 18 | Ht 66.0 in | Wt 233.5 lb

## 2024-08-19 DIAGNOSIS — C187 Malignant neoplasm of sigmoid colon: Secondary | ICD-10-CM

## 2024-08-19 DIAGNOSIS — Z5112 Encounter for antineoplastic immunotherapy: Secondary | ICD-10-CM | POA: Diagnosis not present

## 2024-08-19 LAB — CBC WITH DIFFERENTIAL (CANCER CENTER ONLY)
Abs Immature Granulocytes: 0.03 10*3/uL (ref 0.00–0.07)
Basophils Absolute: 0.1 10*3/uL (ref 0.0–0.1)
Basophils Relative: 1 %
Eosinophils Absolute: 0.6 10*3/uL — ABNORMAL HIGH (ref 0.0–0.5)
Eosinophils Relative: 5 %
HCT: 45.5 % (ref 39.0–52.0)
Hemoglobin: 15.1 g/dL (ref 13.0–17.0)
Immature Granulocytes: 0 %
Lymphocytes Relative: 16 %
Lymphs Abs: 1.7 10*3/uL (ref 0.7–4.0)
MCH: 30.3 pg (ref 26.0–34.0)
MCHC: 33.2 g/dL (ref 30.0–36.0)
MCV: 91.2 fL (ref 80.0–100.0)
Monocytes Absolute: 0.9 10*3/uL (ref 0.1–1.0)
Monocytes Relative: 8 %
Neutro Abs: 7.7 10*3/uL (ref 1.7–7.7)
Neutrophils Relative %: 70 %
Platelet Count: 160 10*3/uL (ref 150–400)
RBC: 4.99 MIL/uL (ref 4.22–5.81)
RDW: 15 % (ref 11.5–15.5)
WBC Count: 11 10*3/uL — ABNORMAL HIGH (ref 4.0–10.5)
nRBC: 0 % (ref 0.0–0.2)

## 2024-08-19 LAB — CMP (CANCER CENTER ONLY)
ALT: 31 U/L (ref 0–44)
AST: 32 U/L (ref 15–41)
Albumin: 4 g/dL (ref 3.5–5.0)
Alkaline Phosphatase: 186 U/L — ABNORMAL HIGH (ref 38–126)
Anion gap: 12 (ref 5–15)
BUN: 11 mg/dL (ref 6–20)
CO2: 27 mmol/L (ref 22–32)
Calcium: 9.5 mg/dL (ref 8.9–10.3)
Chloride: 101 mmol/L (ref 98–111)
Creatinine: 0.7 mg/dL (ref 0.61–1.24)
GFR, Estimated: >60 mL/min
Glucose, Bld: 129 mg/dL — ABNORMAL HIGH (ref 70–99)
Potassium: 3.8 mmol/L (ref 3.5–5.1)
Sodium: 139 mmol/L (ref 135–145)
Total Bilirubin: 0.4 mg/dL (ref 0.0–1.2)
Total Protein: 7.6 g/dL (ref 6.5–8.1)

## 2024-08-19 LAB — CEA (ACCESS): CEA (CHCC): 111.16 ng/mL — ABNORMAL HIGH (ref 0.00–5.00)

## 2024-08-19 LAB — MAGNESIUM: Magnesium: 1.7 mg/dL (ref 1.7–2.4)

## 2024-08-19 MED ORDER — PALONOSETRON HCL INJECTION 0.25 MG/5ML
0.2500 mg | Freq: Once | INTRAVENOUS | Status: AC
  Administered 2024-08-19: 0.25 mg via INTRAVENOUS
  Filled 2024-08-19: qty 5

## 2024-08-19 MED ORDER — SODIUM CHLORIDE 0.9 % IV SOLN: INTRAVENOUS | Status: AC

## 2024-08-19 MED ORDER — SODIUM CHLORIDE 0.9 % IV SOLN
135.0000 mg/m2 | Freq: Once | INTRAVENOUS | Status: AC
  Administered 2024-08-19: 300 mg via INTRAVENOUS
  Filled 2024-08-19: qty 15

## 2024-08-19 MED ORDER — SODIUM CHLORIDE 0.9 % IV SOLN
6.0000 mg/kg | Freq: Once | INTRAVENOUS | Status: AC
  Administered 2024-08-19: 600 mg via INTRAVENOUS
  Filled 2024-08-19: qty 20

## 2024-08-19 MED ORDER — SODIUM CHLORIDE 0.9 % IV SOLN
2400.0000 mg/m2 | INTRAVENOUS | Status: AC
  Administered 2024-08-19: 5000 mg via INTRAVENOUS
  Filled 2024-08-19: qty 100

## 2024-08-19 MED ORDER — ATROPINE SULFATE 1 MG/ML IV SOLN: 0.5000 mg | Freq: Once | INTRAVENOUS | Status: AC | PRN

## 2024-08-19 MED ORDER — FLUOROURACIL CHEMO INJECTION 2.5 GM/50ML
400.0000 mg/m2 | Freq: Once | INTRAVENOUS | Status: AC
  Administered 2024-08-19: 900 mg via INTRAVENOUS
  Filled 2024-08-19: qty 18

## 2024-08-19 MED ORDER — SODIUM CHLORIDE 0.9 % IV SOLN
400.0000 mg/m2 | Freq: Once | INTRAVENOUS | Status: AC
  Administered 2024-08-19: 876 mg via INTRAVENOUS
  Filled 2024-08-19: qty 43.8

## 2024-08-19 MED ORDER — DEXAMETHASONE SOD PHOSPHATE PF 10 MG/ML IJ SOLN
10.0000 mg | Freq: Once | INTRAMUSCULAR | Status: AC
  Administered 2024-08-19: 10 mg via INTRAVENOUS

## 2024-08-19 NOTE — Progress Notes (Signed)
 Patient seen by Dr. Arley Hof today  Vitals are within treatment parameters:No (Please specify and give further instructions.)HR it's okay to treat Per Dr. Hof Leos are within treatment parameters: Yes   Treatment plan has been signed: Yes   Per physician team, Patient is ready for treatment and there are NO modifications to the treatment plan.

## 2024-08-19 NOTE — Patient Instructions (Signed)

## 2024-08-19 NOTE — Patient Instructions (Addendum)
 [facility] A DEPT OF [place]  Discharge Instructions: Thank you for choosing [facility] to provide your oncology and hematology care.   If you have a lab appointment with the Cancer Center, please go directly to the Cancer Center and check in at the registration area.   Wear comfortable clothing and clothing appropriate for easy access to any Portacath or PICC line.   We strive to give you quality time with your provider. You may need to reschedule your appointment if you arrive late (15 or more minutes).  Arriving late affects you and other patients whose appointments are after yours.  Also, if you miss three or more appointments without notifying the office, you may be dismissed from the clinic at the providers discretion.      For prescription refill requests, have your pharmacy contact our office and allow 72 hours for refills to be completed.    Today you received the following chemotherapy and/or immunotherapy agents: vectibix , irinotecan , leucovorin , fluorouracil        To help prevent nausea and vomiting after your treatment, we encourage you to take your nausea medication as directed.  BELOW ARE SYMPTOMS THAT SHOULD BE REPORTED IMMEDIATELY: *FEVER GREATER THAN 100.4 F (38 C) OR HIGHER *CHILLS OR SWEATING *NAUSEA AND VOMITING THAT IS NOT CONTROLLED WITH YOUR NAUSEA MEDICATION *UNUSUAL SHORTNESS OF BREATH *UNUSUAL BRUISING OR BLEEDING *URINARY PROBLEMS (pain or burning when urinating, or frequent urination) *BOWEL PROBLEMS (unusual diarrhea, constipation, pain near the anus) TENDERNESS IN MOUTH AND THROAT WITH OR WITHOUT PRESENCE OF ULCERS (sore throat, sores in mouth, or a toothache) UNUSUAL RASH, SWELLING OR PAIN  UNUSUAL VAGINAL DISCHARGE OR ITCHING   Items with * indicate a potential emergency and should be followed up as soon as possible or go to the Emergency Department if any problems should occur.  Please show the  CHEMOTHERAPY ALERT CARD or IMMUNOTHERAPY ALERT CARD at check-in to the Emergency Department and triage nurse.  Should you have questions after your visit or need to cancel or reschedule your appointment, please contact [facility]  Dept: [phone]  and follow the prompts.  Office hours are 8:00 a.m. to 4:30 p.m. Monday - Friday. Please note that voicemails left after 4:00 p.m. may not be returned until the following business day.  We are closed weekends and major holidays. You have access to a nurse at all times for urgent questions. Please call the main number to the clinic Dept: [phone] and follow the prompts.   For any non-urgent questions, you may also contact your provider using MyChart. We now offer e-Visits for anyone 68 and older to request care online for non-urgent symptoms. For details visit [email or web address].   Also download the MyChart app! Go to the app store, search MyChart, open the app, select [place], and log in with your MyChart username and password.

## 2024-08-19 NOTE — Addendum Note (Signed)
 Addended by: DONNETTA SPARROW A on: 08/19/2024 01:27 PM   Modules accepted: Orders

## 2024-08-19 NOTE — Progress Notes (Addendum)
 " [facility] OFFICE PROGRESS NOTE   Diagnosis: Colon cancer  INTERVAL HISTORY:   [name] completed another cycle of FOLFIRI/panitumumab  on 08/05/2024.  He reports a few episodes of nausea/vomiting on day 3.  He was exposed to a friend with a GI bug on the day prior to onset of the nausea.  He relates the nausea to a virus as opposed to chemotherapy.  The nausea had resolved by day 4.  He had mouth sores on day 5 and used Magic mouthwash.  He reports a stable skin rash.  No paronychia. He reports drinking caffeine prior to the office today. Objective:  Vital signs in last 24 hours:  Blood pressure 114/81, pulse (!) 105, temperature 98.3 F (36.8 C), temperature source Temporal, resp. rate 18, height 5' 6 (1.676 m), weight 233 lb 8 oz (105.9 kg), SpO2 98%.    HEENT: No thrush or ulcers Resp: Lungs clear bilaterally Cardio: Regular rate and rhythm GI: Nontender, no hepatosplenomegaly Vascular: No leg edema  Skin: Acne type rash over the face and trunk, few small areas of paronychia at the fingers  Portacath/PICC-without erythema  Lab Results:  Lab Results  Component Value Date   WBC 11.0 (H) 08/19/2024   HGB 15.1 08/19/2024   HCT 45.5 08/19/2024   MCV 91.2 08/19/2024   PLT 160 08/19/2024   NEUTROABS 7.7 08/19/2024    CMP  Lab Results  Component Value Date   NA 139 08/19/2024   K 3.8 08/19/2024   CL 101 08/19/2024   CO2 27 08/19/2024   GLUCOSE 129 (H) 08/19/2024   BUN 11 08/19/2024   CREATININE 0.70 08/19/2024   CALCIUM  9.5 08/19/2024   PROT 7.6 08/19/2024   ALBUMIN 4.0 08/19/2024   AST 32 08/19/2024   ALT 31 08/19/2024   ALKPHOS 186 (H) 08/19/2024   BILITOT 0.4 08/19/2024   GFRNONAA >60 08/19/2024   GFRAA >60 07/23/2016    Lab Results  Component Value Date   CEA 111.16 (H) 08/19/2024    Medications: I have reviewed the patient's current medications.   Assessment/Plan: Sigmoid colon mass, multiple liver lesions Ultrasound abdomen  01/08/2023-multifocal hyperechoic liver lesions, largest measuring 8.7 cm in the right hepatic lobe CT abdomen/pelvis 01/08/2023-multiple by lobar hypoattenuating hepatic lesions, masslike thickening of the sigmoid colon with abnormal spiculated pericolonic soft tissue.  IMA lymphadenopathy.  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  Tumor cells are positive for HER2 (3+) Colonoscopy 01/29/2023-malignant tumor in the sigmoid colon (invasive moderately differentiated adenocarcinoma), four 6-9 mm polyps in the rectum, ascending colon and cecum, one 14 mm polyp in the sigmoid colon (within the rectal polyp biopsies there are 2 similar-appearing fragments showing invasive tumor with associated adenomatous change which is felt to be carryover from the sigmoid mass biopsies, the rectal polyp biopsies also show multiple fragments of tubular adenoma without high-grade dysplasia) Cycle 1 FOLFOX 01/30/2023 Cycle 2 FOLFOX 02/12/2023 Cycle 3 FOLFOX plus Panitumumab  02/27/2023 Cycle 4 FOLFOX plus Panitumumab  03/13/2023 Cycle 5 FOLFOX plus panitumumab  03/27/2023 Cycle 6 FOLFOX plus Panitumumab  04/10/2023 CTs 04/19/2023-decrease size of hepatic metastases, no new lesions, decreased right upper quadrant and retroperitoneal lymph nodes, sigmoid colonic wall thickening no longer seen with a decrease in sigmoid mesenteric lymph nodes Cycle 7 FOLFOX plus panitumumab  04/24/2023, oxaliplatin  dose reduced secondary to thrombocytopenia and neuropathy Cycle 8 FOLFOX plus Panitumumab   05/08/2023 Cycle 9 FOLFOX plus Panitumumab  05/22/2023 Cycle 10 FOLFOX plus Panitumumab  06/05/2023 Cycle 11 FOLFOX plus panitumumab  06/19/2023 Cycle 12 FOLFOX plus Panitumumab  07/03/2023, oxaliplatin  held due to  thrombocytopenia CTs 07/11/2023: Decrease in hepatic metastases, no enlarged abdominal or pelvic nodes, no metastatic disease to the chest Cycle 13 of 5-FU/panitumumab  07/23/2023 Cycle 14 5-FU/Panitumumab  08/07/2023 Cycle 15 5-FU/panitumumab  08/21/2023 Cycle 16 5-FU/Panitumumab  09/04/2023 Cycle 17 5-FU/panitumumab  09/18/2023 Cycle 18 5-FU/Panitumumab  10/02/2023 Cycle 19 5-FU/Panitumumab  10/16/2023 CTs 10/24/2023: Decrease in hepatic metastases, no evidence of disease progression Cycle 20 5-FU/panitumumab  10/30/2023 Cycle 21 5-FU/Panitumumab  11/13/2023 Cycle 22 5-FU/Panitumumab  11/27/2023 Cycle 23 5-FU/panitumumab  12/11/2023 Cycle 24 5-FU/Panitumumab  12/25/2023 Cycle 25 5-FU/panitumumab  01/08/2024 CT 01/22/2024: Stable liver metastases, no evidence of disease progression Cycle 26 5-FU/panitumumab  01/29/2024 Cycle 27 5-FU/panitumumab  02/12/2024 Cycle 28 5-FU/panitumumab  02/26/2024 Cycle 29 5-FU/Panitumumab  03/11/2024 Cycle 30 5-FU/panitumumab  03/25/2024 Cycle 31 5-FU/panitumumab  04/08/2024 Cycle 32 5-FU/Panitumumab  04/22/2024 CT 04/29/2024: unchanged liver metastases, no evidence of progression. Liver lesions smaller on our measurement possibly from contrast phase Cycle 33 5-FU/Panitumumab  05/06/2024 Cycle 34 5-FU/panitumumab  05/20/2024 Cycle 35 5-FU/Panitumumab  06/03/2024 Cycle 36 5-FU/panitumumab  06/24/2024 CTs 07/03/2024-progressive liver metastases Cycle 1 FOLFIRI plus Panitumumab  07/22/2024, irinotecan  dose reduced due to pending metabolism test Cycle 2 FOLFIRI plus Panitumumab  08/05/2024, irinotecan  metabolism -negative for UGT1A1*28 Cycle 3 FOLFIRI plus panitumumab  08/19/2024 Diabetes Cardiomyopathy Hypertension Hypercholesterolemia Weight loss secondary to Mounjaro, metastatic carcinoma? 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 has metastatic colon cancer.  He has completed 2 cycles of FOLFIRI/panitumumab .  He has tolerated the  chemotherapy well.  The CEA was lower on 08/05/2024.  We will follow-up on the CEA from today.  He will complete another cycle of FOLFIRI/panitumumab  today.  He will return for an office visit and chemotherapy in 2 weeks.  Mr. Sonnenberg will be referred for restaging CTs after cycle 5 FOLFIRI/panitumumab .  Arley Hof, MD  08/19/2024  9:10 AM   "

## 2024-08-21 ENCOUNTER — Inpatient Hospital Stay

## 2024-08-21 ENCOUNTER — Telehealth: Payer: Self-pay

## 2024-08-21 VITALS — BP 111/81 | HR 94 | Temp 98.4°F | Resp 18

## 2024-08-21 DIAGNOSIS — C187 Malignant neoplasm of sigmoid colon: Secondary | ICD-10-CM

## 2024-08-21 MED ORDER — SODIUM CHLORIDE 0.9% FLUSH
10.0000 mL | INTRAVENOUS | Status: DC | PRN
  Administered 2024-08-21: 10 mL

## 2024-08-21 MED ORDER — HEPARIN SOD (PORK) LOCK FLUSH 100 UNIT/ML IV SOLN: 500.0000 [IU] | Freq: Once | INTRAVENOUS | Status: DC | PRN

## 2024-08-21 NOTE — Telephone Encounter (Signed)
 Spoke with patient and confirmed all appointments now scheduled on Wednesday 09/02/24 starting at 0845. He was agreeable and verbalized understanding.

## 2024-09-01 ENCOUNTER — Inpatient Hospital Stay

## 2024-09-01 ENCOUNTER — Inpatient Hospital Stay: Admitting: Nurse Practitioner

## 2024-09-02 ENCOUNTER — Inpatient Hospital Stay: Admitting: Nurse Practitioner

## 2024-09-02 ENCOUNTER — Inpatient Hospital Stay

## 2024-09-04 ENCOUNTER — Inpatient Hospital Stay

## 2024-09-16 ENCOUNTER — Inpatient Hospital Stay: Admitting: Nurse Practitioner

## 2024-09-16 ENCOUNTER — Inpatient Hospital Stay

## 2024-09-18 ENCOUNTER — Inpatient Hospital Stay
